# Patient Record
Sex: Female | Born: 1945
Health system: Southern US, Community
[De-identification: ages and names within clinical notes are randomized; demographics above are authoritative.]

## PROBLEM LIST (undated history)

## (undated) DIAGNOSIS — B86 Scabies: Secondary | ICD-10-CM

## (undated) DIAGNOSIS — R2681 Unsteadiness on feet: Secondary | ICD-10-CM

## (undated) DIAGNOSIS — Z8742 Personal history of other diseases of the female genital tract: Secondary | ICD-10-CM

## (undated) DIAGNOSIS — R413 Other amnesia: Secondary | ICD-10-CM

## (undated) DIAGNOSIS — R197 Diarrhea, unspecified: Secondary | ICD-10-CM

## (undated) DIAGNOSIS — M810 Age-related osteoporosis without current pathological fracture: Secondary | ICD-10-CM

## (undated) DIAGNOSIS — Z0000 Encounter for general adult medical examination without abnormal findings: Secondary | ICD-10-CM

## (undated) DIAGNOSIS — R030 Elevated blood-pressure reading, without diagnosis of hypertension: Secondary | ICD-10-CM

## (undated) DIAGNOSIS — N3281 Overactive bladder: Secondary | ICD-10-CM

## (undated) DIAGNOSIS — L578 Other skin changes due to chronic exposure to nonionizing radiation: Secondary | ICD-10-CM

## (undated) DIAGNOSIS — J449 Chronic obstructive pulmonary disease, unspecified: Secondary | ICD-10-CM

## (undated) HISTORY — DX: Scabies: B86

## (undated) HISTORY — PX: CYSTECTOMY: SUR359

## (undated) HISTORY — DX: Overactive bladder: N32.81

## (undated) HISTORY — DX: Elevated blood-pressure reading, without diagnosis of hypertension: R03.0

## (undated) HISTORY — DX: Age-related osteoporosis without current pathological fracture: M81.0

## (undated) HISTORY — DX: Unsteadiness on feet: R26.81

## (undated) HISTORY — PX: APPENDECTOMY: SHX54

## (undated) HISTORY — DX: Chronic obstructive pulmonary disease, unspecified: J44.9

## (undated) HISTORY — DX: Personal history of other diseases of the female genital tract: Z87.42

## (undated) HISTORY — DX: Encounter for general adult medical examination without abnormal findings: Z00.00

## (undated) HISTORY — DX: Other skin changes due to chronic exposure to nonionizing radiation: L57.8

## (undated) HISTORY — DX: Diarrhea, unspecified: R19.7

## (undated) HISTORY — PX: TONSILECTOMY/ADENOIDECTOMY WITH MYRINGOTOMY: SHX6125

## (undated) HISTORY — PX: OOPHORECTOMY: SHX86

## (undated) HISTORY — DX: Other amnesia: R41.3

---

## 1999-03-13 ENCOUNTER — Emergency Department (HOSPITAL_COMMUNITY): Admission: EM | Admit: 1999-03-13 | Discharge: 1999-03-13 | Payer: Self-pay | Admitting: Emergency Medicine

## 1999-10-02 ENCOUNTER — Emergency Department (HOSPITAL_COMMUNITY): Admission: EM | Admit: 1999-10-02 | Discharge: 1999-10-02 | Payer: Self-pay | Admitting: Emergency Medicine

## 1999-10-03 ENCOUNTER — Encounter: Payer: Self-pay | Admitting: Emergency Medicine

## 2000-04-24 ENCOUNTER — Encounter: Admission: RE | Admit: 2000-04-24 | Discharge: 2000-04-24 | Payer: Self-pay | Admitting: Hematology and Oncology

## 2006-08-13 ENCOUNTER — Emergency Department (HOSPITAL_COMMUNITY): Admission: EM | Admit: 2006-08-13 | Discharge: 2006-08-13 | Payer: Self-pay | Admitting: Emergency Medicine

## 2006-09-26 ENCOUNTER — Inpatient Hospital Stay (HOSPITAL_COMMUNITY): Admission: EM | Admit: 2006-09-26 | Discharge: 2006-09-29 | Payer: Self-pay | Admitting: Emergency Medicine

## 2008-06-28 ENCOUNTER — Ambulatory Visit: Payer: Self-pay | Admitting: Internal Medicine

## 2008-06-28 ENCOUNTER — Ambulatory Visit (HOSPITAL_BASED_OUTPATIENT_CLINIC_OR_DEPARTMENT_OTHER): Admission: RE | Admit: 2008-06-28 | Discharge: 2008-06-28 | Payer: Self-pay | Admitting: Internal Medicine

## 2008-06-28 DIAGNOSIS — R252 Cramp and spasm: Secondary | ICD-10-CM | POA: Insufficient documentation

## 2008-06-28 DIAGNOSIS — A6000 Herpesviral infection of urogenital system, unspecified: Secondary | ICD-10-CM | POA: Insufficient documentation

## 2008-06-28 LAB — CONVERTED CEMR LAB
Albumin: 4 g/dL (ref 3.5–5.2)
Basophils Relative: 0 % (ref 0.0–3.0)
Bilirubin, Direct: 0.1 mg/dL (ref 0.0–0.3)
Calcium: 10 mg/dL (ref 8.4–10.5)
Cholesterol: 243 mg/dL (ref 0–200)
Creatinine, Ser: 0.7 mg/dL (ref 0.4–1.2)
Direct LDL: 147.1 mg/dL
GFR calc Af Amer: 109 mL/min
Glucose, Bld: 97 mg/dL (ref 70–99)
HCT: 42 % (ref 36.0–46.0)
Hemoglobin: 14 g/dL (ref 12.0–15.0)
MCHC: 33.3 g/dL (ref 30.0–36.0)
Monocytes Absolute: 0.3 10*3/uL (ref 0.1–1.0)
Monocytes Relative: 3 % (ref 3.0–12.0)
Neutro Abs: 6.1 10*3/uL (ref 1.4–7.7)
RDW: 13.4 % (ref 11.5–14.6)
Sodium: 142 meq/L (ref 135–145)
TSH: 0.59 microintl units/mL (ref 0.35–5.50)
Total CHOL/HDL Ratio: 4.2
Total Protein: 7.5 g/dL (ref 6.0–8.3)
Vit D, 1,25-Dihydroxy: 24 — ABNORMAL LOW (ref 30–89)

## 2008-06-28 LAB — HM MAMMOGRAPHY: HM Mammogram: NORMAL

## 2008-07-01 ENCOUNTER — Telehealth: Payer: Self-pay | Admitting: Internal Medicine

## 2008-07-01 ENCOUNTER — Encounter: Payer: Self-pay | Admitting: Internal Medicine

## 2008-07-01 DIAGNOSIS — E875 Hyperkalemia: Secondary | ICD-10-CM | POA: Insufficient documentation

## 2008-07-05 ENCOUNTER — Ambulatory Visit: Payer: Self-pay | Admitting: Internal Medicine

## 2008-07-05 LAB — CONVERTED CEMR LAB: Fecal Occult Bld: NEGATIVE

## 2008-07-06 DIAGNOSIS — J449 Chronic obstructive pulmonary disease, unspecified: Secondary | ICD-10-CM | POA: Insufficient documentation

## 2008-07-20 ENCOUNTER — Ambulatory Visit: Payer: Self-pay | Admitting: Internal Medicine

## 2008-07-20 LAB — CONVERTED CEMR LAB
BUN: 8 mg/dL (ref 6–23)
Calcium: 9.1 mg/dL (ref 8.4–10.5)
Chloride: 99 meq/L (ref 96–112)
Creatinine, Ser: 0.6 mg/dL (ref 0.4–1.2)
GFR calc Af Amer: 131 mL/min
GFR calc non Af Amer: 108 mL/min

## 2008-07-21 ENCOUNTER — Telehealth: Payer: Self-pay | Admitting: Internal Medicine

## 2009-01-10 ENCOUNTER — Ambulatory Visit: Payer: Self-pay | Admitting: Internal Medicine

## 2009-01-10 DIAGNOSIS — R599 Enlarged lymph nodes, unspecified: Secondary | ICD-10-CM | POA: Insufficient documentation

## 2009-01-10 DIAGNOSIS — R350 Frequency of micturition: Secondary | ICD-10-CM | POA: Insufficient documentation

## 2009-01-10 LAB — CONVERTED CEMR LAB
Nitrite: NEGATIVE
Specific Gravity, Urine: 1.01
Urobilinogen, UA: 0.2
WBC Urine, dipstick: NEGATIVE

## 2009-01-10 LAB — HM COLONOSCOPY

## 2009-01-16 ENCOUNTER — Ambulatory Visit: Payer: Self-pay | Admitting: Internal Medicine

## 2009-01-16 ENCOUNTER — Telehealth: Payer: Self-pay | Admitting: Internal Medicine

## 2009-01-16 LAB — CONVERTED CEMR LAB
BUN: 16 mg/dL (ref 6–23)
Creatinine, Ser: 0.7 mg/dL (ref 0.4–1.2)
GFR calc non Af Amer: 90.18 mL/min (ref >60)
Glucose, Bld: 102 mg/dL — ABNORMAL HIGH (ref 70–99)

## 2009-01-17 ENCOUNTER — Ambulatory Visit: Payer: Self-pay | Admitting: Diagnostic Radiology

## 2009-01-17 ENCOUNTER — Ambulatory Visit (HOSPITAL_BASED_OUTPATIENT_CLINIC_OR_DEPARTMENT_OTHER): Admission: RE | Admit: 2009-01-17 | Discharge: 2009-01-17 | Payer: Self-pay | Admitting: Internal Medicine

## 2009-01-24 ENCOUNTER — Telehealth: Payer: Self-pay | Admitting: Internal Medicine

## 2009-08-02 ENCOUNTER — Telehealth: Payer: Self-pay | Admitting: *Deleted

## 2010-04-08 ENCOUNTER — Emergency Department (HOSPITAL_BASED_OUTPATIENT_CLINIC_OR_DEPARTMENT_OTHER): Admission: EM | Admit: 2010-04-08 | Discharge: 2010-04-08 | Payer: Self-pay | Admitting: Emergency Medicine

## 2010-04-08 ENCOUNTER — Ambulatory Visit: Payer: Self-pay | Admitting: Diagnostic Radiology

## 2010-04-10 ENCOUNTER — Ambulatory Visit: Payer: Self-pay | Admitting: Internal Medicine

## 2010-04-10 DIAGNOSIS — R1013 Epigastric pain: Secondary | ICD-10-CM | POA: Insufficient documentation

## 2010-04-10 LAB — CONVERTED CEMR LAB

## 2010-08-27 ENCOUNTER — Encounter: Payer: Self-pay | Admitting: Internal Medicine

## 2010-09-04 NOTE — Assessment & Plan Note (Signed)
Summary: INDIGESTION/MHF   Vital Signs:  Patient profile:   65 year old female Weight:      152.75 pounds BMI:     30.96 O2 Sat:      90 % on 3 L/min Temp:     98.2 degrees F oral Pulse rate:   93 / minute Pulse rhythm:   regular Resp:     18 per minute BP sitting:   110 / 66  (left arm) Cuff size:   regular  Vitals Entered By: Glendell Docker CMA (April 10, 2010 3:49 PM)  O2 Flow:  3 L/min CC: Epigastric Pain Pain Assessment Patient in pain? yes     Location: E;pigastric Intensity: 3 Type: heaviness & sharp radiating to back Comments c/o epigastric pain constant with radiating pain to her back. She was seen in [facility] [place] Sunday [facility]. Heartburn ongoing for the past 3-4 weeks     Last PAP Result Declined   Primary Care Provider:  Dondra Spry DO  CC:  Epigastric Pain.  History of Present Illness: 65 y/o white female c/o epigastric abd pain/pressure started on Saturday.  radiated to back [facility]  reviewed CT of abd   Mild inflammatory change around the inferior aspect of the   pancreatic head and uncinate process suggesting mild focal   pancreatitis.  Recommend correlation with amylase and lipase   levels.  takes aleve daily for shoulder pain.  Preventive Screening-Counseling & Management  Alcohol-Tobacco     Smoking Status: quit  Allergies: 1)  ! Asa  Past History:  Past Medical History: Severe COPD (O2 dependent) - followed by pulm at [facility] Genital herpes Asthma  Hx of ovarian cyst   Family History: CAD - mother (deceased age 34) TIA - mother Rheumatoid arthritis - father Lymphoma - father Colon ca - no Breast ca - no Alcoholism - brother    Social History: Married for 7 yrs (2nd marriage) 3 children (2 adopted and 1 biologic) Alcohol use-yes  Occupation: Works at call center   Physical Exam  General:  alert and overweight-appearing.   Lungs:  decreased breath sounds throughout.  normal respiratory effort and no wheezes.   Heart:   normal rate, regular rhythm, and no gallop.   Abdomen:  soft.  mild epigastric tenderness,  no rebound or guarding   Impression & Recommendations:  Problem # 1:  EPIGASTRIC PAIN (ICD-789.06) pain likely secondary to NSAID gastritis.  take PPI x 6-8 weeks.  Patient advised to call office if symptoms persist or worsen.  Complete Medication List: 1)  Advair Diskus 100-50 Mcg/dose Misc (Fluticasone-salmeterol) .... One puff by mouth two times a day 2)  Spiriva Handihaler 18 Mcg Caps (Tiotropium bromide monohydrate) .... Inhale one capsule by mouth once daily 3)  Proair Hfa 108 (90 Base) Mcg/act Aers (Albuterol sulfate) .... 2 puffs by mouth once daily as needed 4)  Valtrex 1 Gm Tabs (Valacyclovir hcl) .... Take 1 tablet by mouth once a day 5)  Omeprazole 40 Mg Cpdr (Omeprazole) .... One by mouth once daily 30 minutes before am meal  Other Orders: Influenza Vaccine NON MCR (16109) Flu Vaccine 82yrs + MEDICARE PATIENTS (U0454)  Patient Instructions: 1)  Please schedule a follow-up appointment in 2 months. 2)  Call our office if your symptoms do not  improve or gets worse. 3)  H. Pylori antibody - 789.06 Prescriptions: OMEPRAZOLE 40 MG CPDR (OMEPRAZOLE) one by mouth once daily 30 minutes before AM meal  #30 x 3  Entered and Authorized by:   D. Thomos Lemons DO   Signed by:   D. Thomos Lemons DO on 04/10/2010   Method used:   Electronically to        [facility] [phone]* (retail)       [address] [place], [place]  [address]       Ph: [phone] or [phone]       Fax: [phone]   RxID:   [id number]   Current Allergies (reviewed today): ! ASA   Preventive Care Screening  Pap Smear:    Date:  04/10/2010    Results:  Declined   Immunizations Administered:  Influenza Vaccine # 1:    Vaccine Type: Fluvax Non-MCR    Site: right deltoid    Mfr: GlaxoSmithKline    Dose: 0.5 ml    Route: IM    Given by: Glendell Docker CMA    Exp. Date: 02/02/2011    Lot #:  [id number]    VIS given: 02/27/10 version given April 10, 2010.  Flu Vaccine Consent Questions:    Do you have a history of severe allergic reactions to this vaccine? no    Any prior history of allergic reactions to egg and/or gelatin? no    Do you have a sensitivity to the preservative Thimersol? no    Do you have a past history of Guillan-Barre Syndrome? no    Do you currently have an acute febrile illness? no    Have you ever had a severe reaction to latex? no    Vaccine information given and explained to patient? yes    Are you currently pregnant? no

## 2010-10-05 ENCOUNTER — Ambulatory Visit (INDEPENDENT_AMBULATORY_CARE_PROVIDER_SITE_OTHER): Payer: BC Managed Care – PPO | Admitting: Internal Medicine

## 2010-10-05 ENCOUNTER — Encounter: Payer: Self-pay | Admitting: Internal Medicine

## 2010-10-05 DIAGNOSIS — A6000 Herpesviral infection of urogenital system, unspecified: Secondary | ICD-10-CM

## 2010-10-05 DIAGNOSIS — J449 Chronic obstructive pulmonary disease, unspecified: Secondary | ICD-10-CM

## 2010-10-11 NOTE — Assessment & Plan Note (Signed)
Summary: follow up/med refill/ss   Vital Signs:  Patient profile:   65 year old female Height:      59 inches Weight:      145 pounds BMI:     29.39 O2 Sat:      96 % on 4 L/min Temp:     97.7 degrees F oral Pulse rate:   96 / minute Resp:     22 per minute BP sitting:   110 / 80  (right arm) Cuff size:   regular  Vitals Entered By: Glendell Docker CMA (October 05, 2010 2:16 PM)  O2 Flow:  4 L/min CC: follow-up visit Comments medication refills   Primary Care Provider:  Dondra Spry DO  CC:  follow-up visit.  History of Present Illness: 65 year old female routine followup Interval history: COPD somewhat improved since retiring she attributes to less allergen exposure at work Advair dose was increased per her pulmonologist  genital herpes-quiescent  Preventive Screening-Counseling & Management  Alcohol-Tobacco     Smoking Status: quit  Allergies: 1)  ! Asa  Past History:  Past Medical History: Severe COPD (O2 dependent) - followed by pulm at [facility] Genital herpes Asthma   Hx of ovarian cyst   Family History: CAD - mother (deceased age 69) TIA - mother Rheumatoid arthritis - father Lymphoma - father Colon ca - no Breast ca - no Alcoholism - brother      Review of Systems       occasional indigestion  Contraindications/Deferment of Procedures/Staging:    Test/Procedure: PAP Smear    Reason for deferment: patient declined   Physical Exam  General:  alert and overweight-appearing.   Lungs:  normal respiratory effort.  prolonged expiration, no wheezing, no crackles Heart:  normal rate, regular rhythm, and no gallop.   Neurologic:  cranial nerves II-XII intact and gait normal.   Psych:  normally interactive, good eye contact, not anxious appearing, and not depressed appearing.     Impression & Recommendations:  Problem # 1:  COPD (ICD-496) Assessment Improved improved.  patient attributes to less allergen exposure since retiring Continue  maintenance inhalers  Her updated medication list for this problem includes:    Advair Diskus 250-50 Mcg/dose Aepb (Fluticasone-salmeterol) .Marland Kitchen... 1 dose two times a day    Spiriva Handihaler 18 Mcg Caps (Tiotropium bromide monohydrate) ..... Inhale one capsule by mouth once daily    Proair Hfa 108 (90 Base) Mcg/act Aers (Albuterol sulfate) .Marland Kitchen... 2 puffs by mouth once daily as needed  Pulmonary Functions Reviewed: O2 sat: 96 (10/05/2010)     Vaccines Reviewed: Pneumovax: given (06/23/2007)   Flu Vax: Fluvax Non-MCR (04/10/2010)  Problem # 2:  GENITAL HERPES (ICD-054.10) Assessment: Unchanged Maintain current medication regimen.  Complete Medication List: 1)  Advair Diskus 250-50 Mcg/dose Aepb (Fluticasone-salmeterol) .Marland Kitchen.. 1 dose two times a day 2)  Spiriva Handihaler 18 Mcg Caps (Tiotropium bromide monohydrate) .... Inhale one capsule by mouth once daily 3)  Proair Hfa 108 (90 Base) Mcg/act Aers (Albuterol sulfate) .... 2 puffs by mouth once daily as needed 4)  Valtrex 1 Gm Tabs (Valacyclovir hcl) .... Take 1 tablet by mouth once a day 5)  Omeprazole 40 Mg Cpdr (Omeprazole) .... One by mouth once daily 30 minutes before am meal  Patient Instructions: 1)  Please schedule a follow-up appointment in 1 year. Prescriptions: VALTREX 1 GM TABS (VALACYCLOVIR HCL) Take 1 tablet by mouth once a day  #90 x 3   Entered and Authorized by:  Dondra Spry DO   Signed by:   D. Thomos Lemons DO on 10/05/2010   Method used:   Print then Give to Patient   RxID:   [id number]    Orders Added: 1)  Est. Patient Level III [14782]     Preventive Care Screening  Mammogram:    Date:  10/05/2010    Results:  Declined  Last Tetanus Booster:    Date:  03/20/2010    Results:  Historical

## 2010-10-18 LAB — COMPREHENSIVE METABOLIC PANEL
ALT: 22 U/L (ref 0–35)
CO2: 33 mEq/L — ABNORMAL HIGH (ref 19–32)
Calcium: 9.5 mg/dL (ref 8.4–10.5)
Creatinine, Ser: 0.7 mg/dL (ref 0.4–1.2)
GFR calc Af Amer: >60 mL/min (ref >60)
GFR calc non Af Amer: >60 mL/min (ref >60)
Glucose, Bld: 112 mg/dL — ABNORMAL HIGH (ref 70–99)
Sodium: 141 mEq/L (ref 135–145)
Total Protein: 7.6 g/dL (ref 6.0–8.3)

## 2010-10-18 LAB — CBC
HCT: 36.9 % (ref 36.0–46.0)
Hemoglobin: 12.3 g/dL (ref 12.0–15.0)
MCH: 32.5 pg (ref 26.0–34.0)
MCHC: 33.2 g/dL (ref 30.0–36.0)
RDW: 13.1 % (ref 11.5–15.5)

## 2010-10-18 LAB — POCT CARDIAC MARKERS
CKMB, poc: <1 ng/mL — ABNORMAL LOW (ref 1.0–8.0)
Myoglobin, poc: 32.9 ng/mL (ref 12–200)
Myoglobin, poc: 49.1 ng/mL (ref 12–200)
Troponin i, poc: <0.05 ng/mL (ref 0.00–0.09)

## 2010-10-18 LAB — LIPASE, BLOOD: Lipase: 151 U/L (ref 23–300)

## 2010-10-18 LAB — DIFFERENTIAL
Basophils Relative: 1 % (ref 0–1)
Eosinophils Relative: 2 % (ref 0–5)
Monocytes Relative: 7 % (ref 3–12)
Neutrophils Relative %: 81 % — ABNORMAL HIGH (ref 43–77)

## 2010-10-18 LAB — URINALYSIS, ROUTINE W REFLEX MICROSCOPIC
Bilirubin Urine: NEGATIVE
Hgb urine dipstick: NEGATIVE
Ketones, ur: 15 mg/dL — AB
Nitrite: NEGATIVE
Protein, ur: NEGATIVE mg/dL
Urobilinogen, UA: 0.2 mg/dL (ref 0.0–1.0)

## 2010-12-19 ENCOUNTER — Ambulatory Visit (INDEPENDENT_AMBULATORY_CARE_PROVIDER_SITE_OTHER): Payer: BC Managed Care – PPO | Admitting: Family

## 2010-12-19 ENCOUNTER — Encounter: Payer: Self-pay | Admitting: Family

## 2010-12-19 VITALS — BP 126/86 | HR 96 | Temp 97.8°F | Resp 18 | Ht 59.02 in | Wt 142.0 lb

## 2010-12-19 DIAGNOSIS — M542 Cervicalgia: Secondary | ICD-10-CM

## 2010-12-19 DIAGNOSIS — J029 Acute pharyngitis, unspecified: Secondary | ICD-10-CM

## 2010-12-19 LAB — POCT RAPID STREP A (OFFICE): Rapid Strep A Screen: NEGATIVE

## 2010-12-19 MED ORDER — AMOXICILLIN 500 MG PO CAPS: 1000.0000 mg | ORAL_CAPSULE | Freq: Three times a day (TID) | ORAL | Status: AC

## 2010-12-19 NOTE — Progress Notes (Signed)
  Subjective:    Patient ID: Tanya Rush, female    DOB: 1946/01/30, 65 y.o.   MRN: [id number]  HPI Tanya Rush is a 65 yr old female who presents today with chief complaint of left sided neck pain. Notes occasional pain with swallowing.  Left ear "burns." Denies fever, nausea, or vomitting. She reports that last night the left side of her neck was swollen.  She is s/p tonsillectomy.     Review of Systems  See HPI  Past Medical History  Diagnosis Date  . COPD (chronic obstructive pulmonary disease)     severe; O2 dependent-- followed by pulm at [facility]  . Asthma   . Genital herpes   . History of ovarian cyst     History   Social History  . Marital Status: Married    Spouse Name: N/A    Number of Children: 3  . Years of Education: N/A   Occupational History  . Not on file.   Social History Main Topics  . Smoking status: Former Smoker -- 45 years    Quit date: 08/05/2006  . Smokeless tobacco: Not on file  . Alcohol Use: Yes  . Drug Use: Not on file  . Sexually Active: Not on file   Other Topics Concern  . Not on file   Social History Narrative   3 children: 2 adopted, 1 biological    No past surgical history on file.  Family History  Problem Relation Age of Onset  . Heart disease Mother   . Stroke Mother   . Arthritis Father     rheumatoid  . Lymphoma Father   . Alcohol abuse Brother   . Cancer Neg Hx     Allergies  Allergen Reactions  . Aspirin     REACTION: sever chest pain    No current outpatient prescriptions on file prior to visit.    BP 126/86  Pulse 96  Temp(Src) 97.8 F (36.6 C) (Oral)  Resp 18  Ht 4' 11.02" (1.499 m)  Wt 142 lb (64.411 kg)  BMI 28.67 kg/m2  SpO2 96%        Objective:   Physical Exam    Gen: awake, alert, elderly female wearing [place] O2, seated on exam table in NAD Neck:  Mild tenderness to palpation of left upper anterior neck without significant swelling, erythema or lymphadenopathy.  Mouth: oropharynx is  clear without erythema Ears: bilateral TM's are intact without erythema or bulging Cv: s1/s2, RRR- no murmur Resp: BS CTA bilaterally without wheezes, rales or rhonchi.  Slightly diminished BS's throughout.      Assessment & Plan:

## 2010-12-19 NOTE — Patient Instructions (Signed)
Call if you develop increased pain, swelling of neck or throat, or fever >100.  Call if your symptoms are not improved in 48-72 hours.

## 2010-12-19 NOTE — Assessment & Plan Note (Signed)
Etiology is unclear at this time.  ? Viral.  Will plan empiric treatment with amoxicillin.  Pt instructed to call if symptoms worsen or if they do not improve.

## 2010-12-21 NOTE — Discharge Summary (Signed)
NAME:  Tanya Rush, [place] NO.:  [id number]   MEDICAL RECORD NO.:  [id number]          PATIENT TYPE:  INP   LOCATION:  1429                         FACILITY:  [facility] ([place])   PHYSICIAN:  Lonia Blood, M.D.DATE OF BIRTH:  01-12-1946   DATE OF ADMISSION:  09/26/2006  DATE OF DISCHARGE:  09/29/2006                               DISCHARGE SUMMARY   PRIMARY CARE PHYSICIAN:  Unassigned.  The patient has already set up a  follow up with [facility].   DISCHARGE DIAGNOSES:  1. Acute exacerbation of chronic obstructive pulmonary disease.  2. Acute infectious bronchitis.  3. Ongoing tobacco abuse, committed to quitting now.  4. Status post cholecystectomy.  5. Status post appendectomy.  6. Status post tonsillectomy and adenoidectomy.  7. Status post left oophorectomy.  8. History of genital herpes simplex on chronic Valtrex.  9. Steroid induced hyperglycemia versus impaired glucose tolerance.      a.     Hemoglobin A1C 6.6.      b.     CBG's 90-130 on Solu-Medrol.      c.     Outpatient follow up recommended.   DISCHARGE MEDICATIONS:  1. Valtrex 1000 mg daily.  2. Advair 100/50 one inhalation b.i.d.  3. Combivent inhaler two puffs q.i.d.  4. Humibid 600 mg b.i.d. x10 days then stop.  5. Protonix 40 mg daily.  6. Avelox 400 mg daily x7 days then stop.  7. Prednisone 10 mg to begin at 60 mg daily x2 days then tapering by      20 mg every two days until discontinuation after 10 mg daily.  8. Fosamax 70 mg weekly.  9. Oxygen at 3 liters per minute nasal canula at all times for home      use.   FOLLOWUP:  The patient reports that she has already set up to follow up  with an internist at [facility].  She does not remember the  internist's name.  I have advised her to keep this appointment.   CONSULTATIONS:  None.   PROCEDURES:  None.   HISTORY OF PRESENT ILLNESS:  For details concerning the patient's  presentation, please see dictated History and  Physical by Dr. Olena Leatherwood  dated September 26, 2006 and labeled job [id number].   HOSPITAL COURSE:  Tanya Rush is a pleasant 65 year old female  with an extensive history of smoking.  She presented to the hospital on  October 02, 2006, with severe shortness of breath.  Physical  examination revealed significant expiratory wheeze.  Chest x-ray failed  to reveal a focal pneumonia but did raise a significant concern for  probable acute bronchitis.  The patient was placed in acute units.  She  was placed on intravenous Solu-Medrol.  She was placed on IV  antibiotics.  Frequent nebulizer therapies were administered.  As the  patient improved, she was titrated to p.o. prednisone.  She tolerated  this without difficulty.  At the time of her discharge, there was no  active wheezing whatsoever.  The patient reported that her shortness of  breath was at its baseline.  The patient was titrated to handheld  inhalers and was cleared for discharge home.   Once the patient had reached her baseline respiratory status, it was  discovered that her O2 saturations still remained below 80% on room air.  Room air ABG was obtained and a PO2 of 46 with a PCO2 of 51, pH 7.3 was  acquired.  It is believed that this is actually an arterial blood gas.  This is corrected quite easily with 3 liters per minute nasal cannula  oxygen.  It is clear, however, that the patient will require ongoing  home oxygen therapy.  This is being arranged through [facility]  at the patient's request.  She has been counseled on the need for this  medication.   During the patient's hospital stay, while she was on high dose Solu-  Medrol, it was appreciated the patient was suffering with some  hyperglycemia.  Hemoglobin A1C was obtained and was found to be just  barely above normal at 6.6.  Capillary blood glucoses were obtained and  were noted to range from 90-130.  It is not felt that the patient has  full blown diabetes at  this time.  No medications were initiated.  Once  the patient is completely off steroids, it is recommended that this be  rechecked in the outpatient setting.  This patient may in fact prove to  have impaired glucose tolerance.   On September 29, 2006, the patient was deemed to be stable for discharge  home.  Home oxygen was arranged.  Vital signs were stable, and the  patient was afebrile with O2 saturation at 92% or greater on 3 liters  per minute nasal cannula.      Lonia Blood, M.D.  Electronically Signed     JTM/MEDQ  D:  09/29/2006  T:  09/29/2006  Job:  [id number]   cc:   [facility] Internal Medicine Division

## 2010-12-21 NOTE — H&P (Signed)
NAME:  Tanya Rush, Tanya Rush                ACCOUNT NO.:  [id number]   MEDICAL RECORD NO.:  [id number]          PATIENT TYPE:  INP   LOCATION:  1429                         [facility]ACILITY[facility]   PHYSICIAN:  Ladell Pier, M.D.   DATE OF BIRTH:  1946-04-19   DATE OF ADMISSION:  09/26/2006  DATE OF DISCHARGE:                              HISTORY & PHYSICAL   CHIEF COMPLAINT:  Back pain and shortness of breath.   HISTORY OF PRESENT ILLNESS:  The patient is a 65 year old white female  with a past medical history significant for tobacco use for which she  quit 6 weeks ago.  She was smoking five cigarettes per day.  Prior to  that she smoked up to 2 packs per day.  She presented to the ED with  back pain, shortness of breath and cough for the past few days.  She has  no chest pain.  She stated that she has had this episode before, went to  the Urgent Care, was diagnosed with asthma and given Advair.  In the ED  she was noted to be hypoxic and was admitted for COPD exacerbation.   PAST MEDICAL HISTORY:  Osteoporosis, asthma, genital herpes.   PAST SURGICAL HISTORY:  1. Status post cholecystectomy.  2. Status post appendectomy.  3. Status post tonsillectomy and adenoidectomy.  4. Status post left oophorectomy.   FAMILY HISTORY:  Mother age 11 with many medical problems.  Father died  from lymphoma.   SOCIAL HISTORY:  The patient is married with two sons.  She smoked up to  2 packs per day in the past but now she is down to about five cigarettes  per day.  She quit about 6 weeks ago.  She works for [employer].   MEDICATIONS:  1. Valtrex daily.  2. Advair 100/50 b.i.d.  3. Fosamax 70 mg daily.   ALLERGIES:  ASPIRIN and PREDNISONE causes depression.   REVIEW OF SYSTEMS:  As per stated in the HPI.   PHYSICAL EXAM:  VITAL SIGNS:  Temperature 98.2, pulse 84, respirations  22, blood pressure 140/74. Pulse ox 63% on room air 95% on 5 liters.  HEENT:  Head is normocephalic,  atraumatic.  Pupils equal, round and  reactive to light. Throat without edema.  CARDIOVASCULAR:  Regular rate and rhythm.  LUNGS:  Rhonchi throughout with expiratory wheezes.  ABDOMEN:  Positive bowel sounds.  EXTREMITIES:  Without edema.   LABORATORY DATA:  Sodium 128, potassium 3.8, chloride 89, CO2 31, BUN 8,  creatinine 0.55, glucose 94.  Cardiac enzymes negative. D-dimer 0.42.  Coags normal.  WBC 6.1, hemoglobin 16.3, platelet 160.  Chest x-ray  showed COPD.   ASSESSMENT/PLAN:  1. Chronic obstructive pulmonary disease exacerbation.  This is most      likely chronic obstructive pulmonary disease exacerbation with her      tobacco history and hypoxia.  She ruled out for cardiac etiology      with normal EKG.  Her D-dimer is negative.  She has no chest pain.      Will put her on Solu-Medrol IV 80  mg q. 6 and Avelox with q. 6-hour      neb treatments.  2. Back pain:  This would probably be secondary to the chronic      obstructive pulmonary disease exacerbation and coughing.  With her      history of osteoporosis if the back pain persists, we will get a      thoracic spine x-ray to rule out compression fracture.  3. Hyponatremia.  This is most likely syndrome of inappropriate      antidiuretic hormone secretion secondary to her pulmonary disease.  4. Osteoporosis.  Will hold her Fosamax while she is in the hospital.  5. Elevated blood pressure. This could be secondary to stress      reaction.  Will monitor.  6. General herpes. Continue her outpatient medications.      Ladell Pier, M.D.  Electronically Signed     NJ/MEDQ  D:  09/26/2006  T:  09/27/2006  Job:  [id number]

## 2011-04-02 ENCOUNTER — Ambulatory Visit (INDEPENDENT_AMBULATORY_CARE_PROVIDER_SITE_OTHER): Payer: BC Managed Care – PPO | Admitting: Internal Medicine

## 2011-04-02 ENCOUNTER — Encounter: Payer: Self-pay | Admitting: Internal Medicine

## 2011-04-02 DIAGNOSIS — J449 Chronic obstructive pulmonary disease, unspecified: Secondary | ICD-10-CM

## 2011-04-02 DIAGNOSIS — Z1239 Encounter for other screening for malignant neoplasm of breast: Secondary | ICD-10-CM

## 2011-04-02 DIAGNOSIS — Z13220 Encounter for screening for lipoid disorders: Secondary | ICD-10-CM

## 2011-04-02 DIAGNOSIS — Z79899 Other long term (current) drug therapy: Secondary | ICD-10-CM

## 2011-04-02 NOTE — Assessment & Plan Note (Signed)
Stable. No recent exacerbation. Continue current regimen. Obtain cbc, chem7.

## 2011-04-02 NOTE — Progress Notes (Signed)
  Subjective:    Patient ID: Tanya Rush, female    DOB: 03-22-1946, 65 y.o.   MRN: [id number]  HPI Pt presents to clinic for followup of multiple medical problems. H/o copd o2 dependent stable without recent exacerbation. Due for annual mammogram and notes no new concerns. Not interested in pap smear.   Past Medical History  Diagnosis Date  . COPD (chronic obstructive pulmonary disease)     severe; O2 dependent-- followed by pulm at [facility]  . Asthma   . Genital herpes   . History of ovarian cyst    No past surgical history on file.  reports that she quit smoking about 4 years ago. She has never used smokeless tobacco. She reports that she does not drink alcohol or use illicit drugs. family history includes Alcohol abuse in her brother; Arthritis in her father; Heart disease in her mother; Lymphoma in her father; and Stroke in her mother.  There is no history of Cancer. Allergies  Allergen Reactions  . Aspirin     REACTION: sever chest pain    Review of Systems see hpi    Objective:   Physical Exam  Physical Exam  Nursing note and vitals reviewed. Constitutional: Appears well-developed and well-nourished. No distress.  HENT:  Head: Normocephalic and atraumatic.  Right Ear: External ear normal.  Left Ear: External ear normal.  Eyes: Conjunctivae are normal. No scleral icterus.  Neck: Neck supple. Carotid bruit is not present.  Cardiovascular: Normal rate, regular rhythm and normal heart sounds.  Exam reveals no gallop and no friction rub.   No murmur heard. Pulmonary/Chest: Effort normal and breath sounds normal. No respiratory distress. He has no wheezes. no rales.   Neurological:Alert.  Skin: Skin is warm and dry. Not diaphoretic.  Psychiatric: Has a normal mood and affect.        Assessment & Plan:

## 2011-04-03 LAB — CBC WITH DIFFERENTIAL/PLATELET
Basophils Relative: 1 % (ref 0–1)
Eosinophils Absolute: 0.4 10*3/uL (ref 0.0–0.7)
Hemoglobin: 12.7 g/dL (ref 12.0–15.0)
Lymphs Abs: 4 10*3/uL (ref 0.7–4.0)
MCH: 32 pg (ref 26.0–34.0)
Neutro Abs: 5.3 10*3/uL (ref 1.7–7.7)
Neutrophils Relative %: 50 % (ref 43–77)
Platelets: 220 10*3/uL (ref 150–400)
RBC: 3.97 MIL/uL (ref 3.87–5.11)
WBC: 10.6 10*3/uL — ABNORMAL HIGH (ref 4.0–10.5)

## 2011-04-03 LAB — HEPATIC FUNCTION PANEL
AST: 20 U/L (ref 0–37)
Alkaline Phosphatase: 54 U/L (ref 39–117)
Total Bilirubin: 0.2 mg/dL — ABNORMAL LOW (ref 0.3–1.2)

## 2011-04-03 LAB — LIPID PANEL
HDL: 52 mg/dL (ref >39)
Total CHOL/HDL Ratio: 4.9 Ratio

## 2011-04-03 LAB — BASIC METABOLIC PANEL
CO2: 31 mEq/L (ref 19–32)
Calcium: 9.4 mg/dL (ref 8.4–10.5)
Potassium: 4.5 mEq/L (ref 3.5–5.3)
Sodium: 140 mEq/L (ref 135–145)

## 2011-04-12 ENCOUNTER — Other Ambulatory Visit: Payer: Self-pay | Admitting: *Deleted

## 2011-04-12 DIAGNOSIS — E785 Hyperlipidemia, unspecified: Secondary | ICD-10-CM

## 2011-04-12 MED ORDER — ATORVASTATIN CALCIUM 20 MG PO TABS: 20.0000 mg | ORAL_TABLET | Freq: Every day | ORAL | Status: DC

## 2011-04-15 ENCOUNTER — Telehealth: Payer: Self-pay | Admitting: *Deleted

## 2011-04-15 DIAGNOSIS — E785 Hyperlipidemia, unspecified: Secondary | ICD-10-CM

## 2011-04-15 NOTE — Telephone Encounter (Signed)
Patient called and left voice message stating she has a 30 day supply of Lipitor at her pharmacy waiting to be picked up. She is wanting to know if she could get a 90 day supply on [facility] to [facility].

## 2011-04-16 MED ORDER — ATORVASTATIN CALCIUM 20 MG PO TABS: 20.0000 mg | ORAL_TABLET | Freq: Every day | ORAL | Status: DC

## 2011-04-16 NOTE — Telephone Encounter (Signed)
Rx qty adjusted and sent to pharmacy.

## 2011-09-30 ENCOUNTER — Ambulatory Visit (HOSPITAL_BASED_OUTPATIENT_CLINIC_OR_DEPARTMENT_OTHER)
Admission: RE | Admit: 2011-09-30 | Discharge: 2011-09-30 | Disposition: A | Discharge status: Home / Self Care | Payer: BC Managed Care – PPO | Source: Ambulatory Visit | Attending: Internal Medicine | Admitting: Internal Medicine

## 2011-09-30 ENCOUNTER — Ambulatory Visit (INDEPENDENT_AMBULATORY_CARE_PROVIDER_SITE_OTHER): Payer: BC Managed Care – PPO | Admitting: Internal Medicine

## 2011-09-30 ENCOUNTER — Encounter: Payer: Self-pay | Admitting: Internal Medicine

## 2011-09-30 ENCOUNTER — Telehealth: Payer: Self-pay | Admitting: Internal Medicine

## 2011-09-30 VITALS — BP 100/80 | HR 84 | Temp 97.7°F | Ht 59.0 in | Wt 142.0 lb

## 2011-09-30 DIAGNOSIS — Z79899 Other long term (current) drug therapy: Secondary | ICD-10-CM

## 2011-09-30 DIAGNOSIS — K5900 Constipation, unspecified: Secondary | ICD-10-CM

## 2011-09-30 DIAGNOSIS — E785 Hyperlipidemia, unspecified: Secondary | ICD-10-CM

## 2011-09-30 DIAGNOSIS — Z1239 Encounter for other screening for malignant neoplasm of breast: Secondary | ICD-10-CM

## 2011-09-30 DIAGNOSIS — E782 Mixed hyperlipidemia: Secondary | ICD-10-CM | POA: Insufficient documentation

## 2011-09-30 DIAGNOSIS — Z1231 Encounter for screening mammogram for malignant neoplasm of breast: Secondary | ICD-10-CM | POA: Insufficient documentation

## 2011-09-30 LAB — LIPID PANEL: Cholesterol: 165 mg/dL (ref 0–200)

## 2011-09-30 NOTE — Assessment & Plan Note (Signed)
Recommend benefiber daily. Followup if no improvement or worsening.

## 2011-09-30 NOTE — Patient Instructions (Signed)
Please schedule lipid/lft 272.4 and chem7 v58.69 prior to next visit 

## 2011-09-30 NOTE — Progress Notes (Signed)
  Subjective:    Patient ID: Tanya Rush, female    DOB: 1946-02-22, 66 y.o.   MRN: [id number]  HPI Pt presents to clinic for followup of multiple medical problems. Tolerating statin tx without myalgias. Notes chronic mild constipation. Take stool softeners prn. No recent copd exacerbation. Has not gotten mammogram yet but will do so this am. No other complaints.  Past Medical History  Diagnosis Date  . COPD (chronic obstructive pulmonary disease)     severe; O2 dependent-- [facility] pulm at [facility]  . Asthma   . Genital herpes   . History of ovarian cyst    No past surgical history on file.  reports that she quit smoking about 5 years ago. She has never used smokeless tobacco. She reports that she does not drink alcohol or use illicit drugs. family history includes Alcohol abuse in her brother; Arthritis in her father; Heart disease in her mother; Lymphoma in her father; and Stroke in her mother.  There is no history of Cancer. Allergies  Allergen Reactions  . Aspirin     REACTION: sever chest pain      Review of Systems see hpi     Objective:   Physical Exam  Physical Exam  Nursing note and vitals reviewed. Constitutional: Appears well-developed and well-nourished. No distress.  HENT:  Head: Normocephalic and atraumatic.  Right Ear: External ear normal.  Left Ear: External ear normal.  Eyes: Conjunctivae are normal. No scleral icterus.  Neck: Neck supple. Carotid bruit is not present.  Cardiovascular: Normal rate, regular rhythm and normal heart sounds.  Exam reveals no gallop and no friction rub.   No murmur heard. Pulmonary/Chest: Effort normal and breath sounds normal. No respiratory distress. He has no wheezes. no rales.  Lymphadenopathy:    He has no cervical adenopathy.  Neurological:Alert.  Skin: Skin is warm and dry. Not diaphoretic.  Psychiatric: Has a normal mood and affect.        Assessment & Plan:

## 2011-09-30 NOTE — Assessment & Plan Note (Signed)
Obtain lipid/lft. 

## 2011-09-30 NOTE — Telephone Encounter (Signed)
Lab order entered for August 2013. 

## 2011-10-01 LAB — HEPATIC FUNCTION PANEL
ALT: 17 U/L (ref 0–35)
AST: 20 U/L (ref 0–37)
Alkaline Phosphatase: 61 U/L (ref 39–117)
Bilirubin, Direct: <0.1 mg/dL (ref 0.0–0.3)

## 2011-10-02 ENCOUNTER — Telehealth: Payer: Self-pay | Admitting: Internal Medicine

## 2011-10-02 MED ORDER — VALACYCLOVIR HCL 1 G PO TABS: 1000.0000 mg | ORAL_TABLET | Freq: Every day | ORAL | Status: DC

## 2011-10-02 NOTE — Telephone Encounter (Signed)
Rx refill sent to pharmacy. 

## 2011-11-18 ENCOUNTER — Encounter: Payer: Self-pay | Admitting: Internal Medicine

## 2011-11-18 ENCOUNTER — Ambulatory Visit (INDEPENDENT_AMBULATORY_CARE_PROVIDER_SITE_OTHER): Payer: Medicare Other | Admitting: Internal Medicine

## 2011-11-18 VITALS — BP 122/90 | HR 83 | Temp 98.3°F | Resp 22 | Wt 141.0 lb

## 2011-11-18 DIAGNOSIS — N898 Other specified noninflammatory disorders of vagina: Secondary | ICD-10-CM

## 2011-11-18 NOTE — Progress Notes (Signed)
  Subjective:    Patient ID: Tanya Rush, female    DOB: Oct 19, 1945, 66 y.o.   MRN: [id number]  HPI Pt presents to clinic for evaluation of vaginal discharge. Notes one week h/o green discharge without pelvic pain. Not sexually active. Attempt otc yeast cream with initial improvement. Does note some itching/burning sensation. No other alleviating or exacerbating factors. No other complaints.  Past Medical History  Diagnosis Date  . COPD (chronic obstructive pulmonary disease)     severe; O2 dependent-- followed by pulm at [facility]  . Asthma   . Genital herpes   . History of ovarian cyst    No past surgical history on file.  reports that she quit smoking about 5 years ago. She has never used smokeless tobacco. She reports that she does not drink alcohol or use illicit drugs. family history includes Alcohol abuse in her brother; Arthritis in her father; Heart disease in her mother; Lymphoma in her father; and Stroke in her mother.  There is no history of Cancer. Allergies  Allergen Reactions  . Aspirin     REACTION: sever chest pain     Review of Systems see hpi     Objective:   Physical Exam  Nursing note and vitals reviewed. Constitutional: She appears well-developed and well-nourished. No distress.  HENT:  Head: Normocephalic and atraumatic.  Genitourinary:       With female nurse escort exam performed. Speculum exam reveals moderate amount of yellow/tan discharge within the vagina. No vaginal mucosal lesions noted. Cervix not visualized due to discharge.   Skin: She is not diaphoretic.          Assessment & Plan:

## 2011-11-18 NOTE — Assessment & Plan Note (Signed)
Obtain KOH and wet prep. Continue yeast cream pending results.

## 2011-11-19 LAB — WET PREP BY MOLECULAR PROBE
Gardnerella vaginalis: NEGATIVE
Trichomonas vaginosis: NEGATIVE

## 2011-11-20 ENCOUNTER — Other Ambulatory Visit: Payer: Self-pay | Admitting: Internal Medicine

## 2011-11-20 MED ORDER — METRONIDAZOLE 500 MG PO TABS: 500.0000 mg | ORAL_TABLET | Freq: Two times a day (BID) | ORAL | Status: AC

## 2012-03-20 ENCOUNTER — Telehealth: Payer: Self-pay | Admitting: Internal Medicine

## 2012-03-20 MED ORDER — VALACYCLOVIR HCL 1 G PO TABS: 1000.0000 mg | ORAL_TABLET | Freq: Every day | ORAL | Status: DC

## 2012-03-20 NOTE — Telephone Encounter (Signed)
Refill- valacyclovir hcl 1 gram tablet. Take one tablet every day. Qty 90 last fill 5.16.13

## 2012-03-20 NOTE — Telephone Encounter (Signed)
Rx Done/SLS 

## 2012-03-23 ENCOUNTER — Ambulatory Visit: Payer: BC Managed Care – PPO | Admitting: Internal Medicine

## 2012-04-28 LAB — HEPATIC FUNCTION PANEL
AST: 19 U/L (ref 0–37)
Alkaline Phosphatase: 46 U/L (ref 39–117)
Bilirubin, Direct: <0.1 mg/dL (ref 0.0–0.3)
Total Bilirubin: 0.2 mg/dL — ABNORMAL LOW (ref 0.3–1.2)

## 2012-04-28 LAB — BASIC METABOLIC PANEL
CO2: 31 mEq/L (ref 19–32)
Calcium: 9.4 mg/dL (ref 8.4–10.5)
Creat: 0.63 mg/dL (ref 0.50–1.10)
Glucose, Bld: 84 mg/dL (ref 70–99)

## 2012-04-28 NOTE — Addendum Note (Signed)
Addended by: Regis Bill on: 04/28/2012 02:29 PM   Modules accepted: Orders

## 2012-04-28 NOTE — Telephone Encounter (Signed)
Lab orders released/SLS 

## 2012-04-30 ENCOUNTER — Encounter: Payer: Self-pay | Admitting: Internal Medicine

## 2012-04-30 ENCOUNTER — Ambulatory Visit (INDEPENDENT_AMBULATORY_CARE_PROVIDER_SITE_OTHER): Payer: Medicare Other | Admitting: Internal Medicine

## 2012-04-30 VITALS — BP 128/82 | HR 78 | Temp 97.5°F | Resp 16 | Wt 142.0 lb

## 2012-04-30 DIAGNOSIS — M2550 Pain in unspecified joint: Secondary | ICD-10-CM

## 2012-04-30 DIAGNOSIS — N898 Other specified noninflammatory disorders of vagina: Secondary | ICD-10-CM

## 2012-04-30 DIAGNOSIS — E785 Hyperlipidemia, unspecified: Secondary | ICD-10-CM

## 2012-04-30 MED ORDER — METRONIDAZOLE 500 MG PO TABS: 500.0000 mg | ORAL_TABLET | Freq: Two times a day (BID) | ORAL | Status: DC

## 2012-04-30 MED ORDER — ATORVASTATIN CALCIUM 20 MG PO TABS: 20.0000 mg | ORAL_TABLET | Freq: Every day | ORAL | Status: DC

## 2012-04-30 NOTE — Patient Instructions (Signed)
Please schedule fasting labs approximately 6 weeks after beginning cholesterol medication Lipid/lft-272.4 and esr, rheumatoid factor-arthralgias and ck (non cardiac)-myalgia

## 2012-05-03 DIAGNOSIS — M2550 Pain in unspecified joint: Secondary | ICD-10-CM | POA: Insufficient documentation

## 2012-05-03 NOTE — Assessment & Plan Note (Signed)
Diffuse. Family history of rheumatoid arthritis. Obtain ESR and rheumatoid factor.

## 2012-05-03 NOTE — Progress Notes (Signed)
  Subjective:    Patient ID: Tanya Rush, female    DOB: 08/24/1945, 66 y.o.   MRN: [id number]  HPI Pt presents to clinic for followup of multiple medical problems. Previous vaginal discharge in April resolved with doxycycline. States has recently returned in a similar manner. Notes diffuse arthralgias without inflammatory changes such as swelling redness or prolonged morning stiffness. Notes multiple family members with history of rheumatoid arthritis. Weight stable. Blood pressure reviewed as normotensive. Cholesterol reviewed significantly elevated. Off Lipitor. No previous side effects.  Past Medical History  Diagnosis Date  . COPD (chronic obstructive pulmonary disease)     severe; O2 dependent-- followed by pulm at [facility]  . Asthma   . Genital herpes   . History of ovarian cyst    No past surgical history on file.  reports that she quit smoking about 5 years ago. She has never used smokeless tobacco. She reports that she does not drink alcohol or use illicit drugs. family history includes Alcohol abuse in her brother; Arthritis in her father; Heart disease in her mother; Lymphoma in her father; and Stroke in her mother.  There is no history of Cancer. Allergies  Allergen Reactions  . Aspirin     REACTION: sever chest pain      Review of Systems see hpi     Objective:   Physical Exam  Physical Exam  Nursing note and vitals reviewed. Constitutional: Appears well-developed and well-nourished. No distress.  HENT:  Head: Normocephalic and atraumatic.  Right Ear: External ear normal.  Left Ear: External ear normal.  Eyes: Conjunctivae are normal. No scleral icterus.  Neck: Neck supple. Carotid bruit is not present.  Cardiovascular: Normal rate, regular rhythm and normal heart sounds.  Exam reveals no gallop and no friction rub.   No murmur heard. Pulmonary/Chest: Effort normal and breath sounds normal. No respiratory distress. He has no wheezes. no rales.    Lymphadenopathy:    He has no cervical adenopathy.  Neurological:Alert.  Skin: Skin is warm and dry. Not diaphoretic.  Psychiatric: Has a normal mood and affect.        Assessment & Plan:

## 2012-05-03 NOTE — Assessment & Plan Note (Signed)
Suboptimal control. Resume Lipitor.

## 2012-05-03 NOTE — Assessment & Plan Note (Signed)
Attempt seven-day course of Flagyl. It becomes recurrent proceed with GYN consult

## 2012-05-07 ENCOUNTER — Ambulatory Visit: Payer: Medicare Other | Admitting: Internal Medicine

## 2012-08-28 ENCOUNTER — Telehealth: Payer: Self-pay | Admitting: *Deleted

## 2012-08-28 ENCOUNTER — Other Ambulatory Visit: Payer: Self-pay | Admitting: Family Medicine

## 2012-08-28 DIAGNOSIS — M791 Myalgia, unspecified site: Secondary | ICD-10-CM

## 2012-08-28 DIAGNOSIS — E785 Hyperlipidemia, unspecified: Secondary | ICD-10-CM

## 2012-08-28 DIAGNOSIS — M2550 Pain in unspecified joint: Secondary | ICD-10-CM

## 2012-08-28 LAB — LIPID PANEL
LDL Cholesterol: 101 mg/dL — ABNORMAL HIGH (ref 0–99)
Triglycerides: 123 mg/dL (ref <150)
VLDL: 25 mg/dL (ref 0–40)

## 2012-08-28 LAB — HEPATIC FUNCTION PANEL
Albumin: 4.2 g/dL (ref 3.5–5.2)
Alkaline Phosphatase: 54 U/L (ref 39–117)
Total Bilirubin: 0.3 mg/dL (ref 0.3–1.2)
Total Protein: 6.8 g/dL (ref 6.0–8.3)

## 2012-08-28 NOTE — Telephone Encounter (Signed)
Pt presented to the lab, orders entered per 04/2012 office note as below:  Please schedule fasting labs approximately 6 weeks after beginning cholesterol medication  Lipid/lft-272.4 and esr, rheumatoid factor-arthralgias and ck (non cardiac)-myalgia

## 2012-09-19 ENCOUNTER — Other Ambulatory Visit: Payer: Self-pay

## 2012-09-28 ENCOUNTER — Ambulatory Visit (INDEPENDENT_AMBULATORY_CARE_PROVIDER_SITE_OTHER): Payer: Medicare Other | Admitting: Family Medicine

## 2012-09-28 ENCOUNTER — Encounter: Payer: Self-pay | Admitting: Family Medicine

## 2012-09-28 VITALS — BP 142/86 | HR 88 | Temp 98.3°F | Ht 59.0 in | Wt 145.1 lb

## 2012-09-28 DIAGNOSIS — J449 Chronic obstructive pulmonary disease, unspecified: Secondary | ICD-10-CM

## 2012-09-28 DIAGNOSIS — E785 Hyperlipidemia, unspecified: Secondary | ICD-10-CM

## 2012-09-28 DIAGNOSIS — IMO0001 Reserved for inherently not codable concepts without codable children: Secondary | ICD-10-CM

## 2012-09-28 DIAGNOSIS — R030 Elevated blood-pressure reading, without diagnosis of hypertension: Secondary | ICD-10-CM

## 2012-09-28 HISTORY — DX: Reserved for inherently not codable concepts without codable children: IMO0001

## 2012-09-28 NOTE — Progress Notes (Signed)
Patient ID: Tanya Rush, female   DOB: 20-Mar-1946, 67 y.o.   MRN: [id number] ISHIA TENORIO [id number] Apr 16, 1946 09/28/2012      Progress Note-Follow Up  Subjective  Chief Complaint  Chief Complaint  Patient presents with  . Follow-up    5 month    HPI  Patient is a 67 year old Caucasian female who is in today for followup. Overall she reports doing well. She's recently been seen by her pulmonologist and been told her lung function has remained stable over this past year. She reports using her inhalers as prescribed. She denies any recent illness. Denies any wheezing but of course has some shortness or breath with exertion. No chest pain or palpitations, GI or GU complaints. No fevers or chills. Has occasional headaches but these are at her baseline.  Past Medical History  Diagnosis Date  . COPD (chronic obstructive pulmonary disease)     severe; O2 dependent-- followed by pulm at [facility]  . Asthma   . Genital herpes   . History of ovarian cyst   . Elevated BP 09/28/2012    History reviewed. No pertinent past surgical history.  Family History  Problem Relation Age of Onset  . Heart disease Mother   . Stroke Mother   . Arthritis Father     rheumatoid  . Lymphoma Father   . Alcohol abuse Brother   . Cancer Neg Hx     History   Social History  . Marital Status: Married    Spouse Name: N/A    Number of Children: 3  . Years of Education: N/A   Occupational History  . Not on file.   Social History Main Topics  . Smoking status: Former Smoker -- 45 years    Quit date: 08/05/2006  . Smokeless tobacco: Never Used  . Alcohol Use: No  . Drug Use: No  . Sexually Active: Not on file   Other Topics Concern  . Not on file   Social History Narrative   3 children: 2 adopted, 1 biological          Current Outpatient Prescriptions on File Prior to Visit  Medication Sig Dispense Refill  . albuterol (PROAIR HFA) 108 (90 BASE) MCG/ACT inhaler Inhale 2 puffs into the  lungs daily as needed.        Marland Kitchen atorvastatin (LIPITOR) 20 MG tablet Take 1 tablet (20 mg total) by mouth daily.  90 tablet  2  . Fluticasone-Salmeterol (ADVAIR DISKUS) 250-50 MCG/DOSE AEPB Inhale 1 puff into the lungs every 12 (twelve) hours.       . Multiple Vitamin (MULTIVITAMIN) tablet Take 1 tablet by mouth daily.      Marland Kitchen tiotropium (SPIRIVA) 18 MCG inhalation capsule Place 18 mcg into inhaler and inhale daily.        . valACYclovir (VALTREX) 1000 MG tablet Take 1 tablet (1,000 mg total) by mouth daily.  90 tablet  1   No current facility-administered medications on file prior to visit.    Allergies  Allergen Reactions  . Aspirin     REACTION: sever chest pain    Review of Systems  Review of Systems  Constitutional: Negative for fever and malaise/fatigue.  HENT: Negative for congestion.   Eyes: Negative for discharge.  Respiratory: Negative for shortness of breath.   Cardiovascular: Negative for chest pain, palpitations and leg swelling.  Gastrointestinal: Negative for nausea, abdominal pain and diarrhea.  Genitourinary: Negative for dysuria.  Musculoskeletal: Negative for falls.  Skin: Negative for rash.  Neurological: Negative for loss of consciousness and headaches.  Endo/Heme/Allergies: Negative for polydipsia.  Psychiatric/Behavioral: Negative for depression and suicidal ideas. The patient is not nervous/anxious and does not have insomnia.     Objective  BP 142/86  Pulse 88  Temp(Src) 98.3 F (36.8 C) (Oral)  Ht 4\' 11"  (1.499 m)  Wt 145 lb 1.9 oz (65.826 kg)  BMI 29.3 kg/m2  SpO2 94%  Physical Exam  Physical Exam  Constitutional: She is oriented to person, place, and time and well-developed, well-nourished, and in no distress. No distress.  HENT:  Head: Normocephalic and atraumatic.  Eyes: Conjunctivae are normal.  Neck: Neck supple. No thyromegaly present.  Cardiovascular: Normal rate and regular rhythm.  Exam reveals no gallop.   No murmur  heard. Distant heart sounds  Pulmonary/Chest: Effort normal and breath sounds normal. She has no wheezes.  O2 via [place]  Abdominal: She exhibits no distension and no mass.  Musculoskeletal: She exhibits no edema.  Lymphadenopathy:    She has no cervical adenopathy.  Neurological: She is alert and oriented to person, place, and time.  Skin: Skin is warm and dry. No rash noted. She is not diaphoretic.  Psychiatric: Memory, affect and judgment normal.    Lab Results  Component Value Date   TSH 0.59 06/28/2008   Lab Results  Component Value Date   WBC 10.6* 04/02/2011   HGB 12.7 04/02/2011   HCT 40.9 04/02/2011   MCV 103.0* 04/02/2011   PLT 220 04/02/2011   Lab Results  Component Value Date   CREATININE 0.63 04/28/2012   BUN 14 04/28/2012   NA 139 04/28/2012   K 4.5 04/28/2012   CL 99 04/28/2012   CO2 31 04/28/2012   Lab Results  Component Value Date   ALT 25 08/28/2012   AST 24 08/28/2012   ALKPHOS 54 08/28/2012   BILITOT 0.3 08/28/2012   Lab Results  Component Value Date   CHOL 186 08/28/2012   Lab Results  Component Value Date   HDL 60 08/28/2012   Lab Results  Component Value Date   LDLCALC 101* 08/28/2012   Lab Results  Component Value Date   TRIG 123 08/28/2012   Lab Results  Component Value Date   CHOLHDL 3.1 08/28/2012     Assessment & Plan  Other and unspecified hyperlipidemia Improved on LIpitor, avoid trans fats. Discussed fact that she may have to change statins for cost reasons. Will call if she decides to switch  COPD She reports her annual exam was recently and she was told she has not had any loss of lung function this year  Elevated BP Improved on recheck, minimize sodium abd caffeine

## 2012-09-28 NOTE — Patient Instructions (Addendum)
Labs prior to visit, lipid, renal, hepatic, cbc, tsh  Hypercholesterolemia High Blood Cholesterol Cholesterol is a white, waxy, fat-like protein needed by your body in small amounts. The liver makes all the cholesterol you need. It is carried from the liver by the blood through the blood vessels. Deposits (plaque) may build up on blood vessel walls. This makes the arteries narrower and stiffer. Plaque increases the risk for heart attack and stroke. You cannot feel your cholesterol level even if it is very high. The only way to know is by a blood test to check your lipid (fats) levels. Once you know your cholesterol levels, you should keep a record of the test results. Work with your caregiver to to keep your levels in the desired range. WHAT THE RESULTS MEAN:  Total cholesterol is a rough measure of all the cholesterol in your blood.  LDL is the so-called bad cholesterol. This is the type that deposits cholesterol in the walls of the arteries. You want this level to be low.  HDL is the good cholesterol because it cleans the arteries and carries the LDL away. You want this level to be high.  Triglycerides are fat that the body can either burn for energy or store. High levels are closely linked to heart disease. DESIRED LEVELS:  Total cholesterol below 200.  LDL below 100 for people at risk, below 70 for very high risk.  HDL above 50 is good, above 60 is best.  Triglycerides below 150. HOW TO LOWER YOUR CHOLESTEROL:  Diet.  Choose fish or white meat chicken and [place], roasted or baked. Limit fatty cuts of red meat, fried foods, and processed meats, such as sausage and lunch meat.  Eat lots of fresh fruits and vegetables. Choose whole grains, beans, pasta, potatoes and cereals.  Use only small amounts of olive, corn or canola oils. Avoid butter, mayonnaise, shortening or palm kernel oils. Avoid foods with trans-fats.  Use skim/nonfat milk and low-fat/nonfat yogurt and cheeses. Avoid  whole milk, cream, ice cream, egg yolks and cheeses. Healthy desserts include angel food cake, gingersnaps, animal crackers, hard candy, popsicles, and low-fat/nonfat frozen yogurt. Avoid pastries, cakes, pies and cookies.  Exercise.  A regular program helps decrease LDL and raises HDL.  Helps with weight control.  Do things that increase your activity level like gardening, walking, or taking the stairs.  Medication.  May be prescribed by your caregiver to help lowering cholesterol and the risk for heart disease.  You may need medicine even if your levels are normal if you have several risk factors. HOME CARE INSTRUCTIONS   Follow your diet and exercise programs as suggested by your caregiver.  Take medications as directed.  Have blood work done when your caregiver feels it is necessary. MAKE SURE YOU:   Understand these instructions.  Will watch your condition.  Will get help right away if you are not doing well or get worse. Document Released: 07/22/2005 Document Revised: 10/14/2011 Document Reviewed: 01/07/2007 [facility] Patient Information 2013 [place], [place].

## 2012-09-28 NOTE — Assessment & Plan Note (Signed)
Improved on LIpitor, avoid trans fats. Discussed fact that she may have to change statins for cost reasons. Will call if she decides to switch

## 2012-09-28 NOTE — Assessment & Plan Note (Signed)
Improved on recheck, minimize sodium abd caffeine

## 2012-09-28 NOTE — Assessment & Plan Note (Addendum)
She reports her annual exam was recently and she was told she has not had any loss of lung function this year

## 2012-10-06 ENCOUNTER — Other Ambulatory Visit: Payer: Self-pay | Admitting: Internal Medicine

## 2013-01-14 ENCOUNTER — Ambulatory Visit (INDEPENDENT_AMBULATORY_CARE_PROVIDER_SITE_OTHER): Payer: Medicare Other | Admitting: Family Medicine

## 2013-01-14 ENCOUNTER — Encounter: Payer: Self-pay | Admitting: Family Medicine

## 2013-01-14 VITALS — BP 154/100 | HR 99 | Temp 98.4°F | Ht 59.0 in | Wt 133.1 lb

## 2013-01-14 DIAGNOSIS — R030 Elevated blood-pressure reading, without diagnosis of hypertension: Secondary | ICD-10-CM

## 2013-01-14 DIAGNOSIS — B86 Scabies: Secondary | ICD-10-CM

## 2013-01-14 DIAGNOSIS — IMO0001 Reserved for inherently not codable concepts without codable children: Secondary | ICD-10-CM

## 2013-01-14 MED ORDER — PERMETHRIN 5 % EX CREA: TOPICAL_CREAM | Freq: Once | CUTANEOUS | Status: DC

## 2013-01-14 NOTE — Patient Instructions (Addendum)
Try Witch Hazel Astringent  Scabies Scabies are small bugs (mites) that burrow under the skin and cause red bumps and severe itching. These bugs can only be seen with a microscope. Scabies are highly contagious. They can spread easily from person to person by direct contact. They are also spread through sharing clothing or linens that have the scabies mites living in them. It is not unusual for an entire family to become infected through shared towels, clothing, or bedding.  HOME CARE INSTRUCTIONS   Your caregiver may prescribe a cream or lotion to kill the mites. If cream is prescribed, massage the cream into the entire body from the neck to the bottom of both feet. Also massage the cream into the scalp and face if your child is less than 4 year old. Avoid the eyes and mouth. Do not wash your hands after application.  Leave the cream on for 8 to 12 hours. Your child should bathe or shower after the 8 to 12 hour application period. Sometimes it is helpful to apply the cream to your child right before bedtime.  One treatment is usually effective and will eliminate approximately 95% of infestations. For severe cases, your caregiver may decide to repeat the treatment in 1 week. Everyone in your household should be treated with one application of the cream.  New rashes or burrows should not appear within 24 to 48 hours after successful treatment. However, the itching and rash may last for 2 to 4 weeks after successful treatment. Your caregiver may prescribe a medicine to help with the itching or to help the rash go away more quickly.  Scabies can live on clothing or linens for up to 3 days. All of your child's recently used clothing, towels, stuffed toys, and bed linens should be washed in hot water and then dried in a dryer for at least 20 minutes on high heat. Items that cannot be washed should be enclosed in a plastic bag for at least 3 days.  To help relieve itching, bathe your child in a cool bath  or apply cool washcloths to the affected areas.  Your child may return to school after treatment with the prescribed cream. SEEK MEDICAL CARE IF:   The itching persists longer than 4 weeks after treatment.  The rash spreads or becomes infected. Signs of infection include red blisters or yellow-tan crust. Document Released: 07/22/2005 Document Revised: 10/14/2011 Document Reviewed: 11/30/2008 [facility] Patient Information 2014 [place], [place].

## 2013-01-16 ENCOUNTER — Encounter: Payer: Self-pay | Admitting: Family Medicine

## 2013-01-16 DIAGNOSIS — B86 Scabies: Secondary | ICD-10-CM

## 2013-01-16 HISTORY — DX: Scabies: B86

## 2013-01-16 NOTE — Progress Notes (Signed)
Patient ID: Tanya Rush, female   DOB: [birth year], 67 y.o.   MRN: [id number] Tanya Rush [id number] 01/19/1946 01/16/2013      Progress Note-Follow Up  Subjective  Chief Complaint  Chief Complaint  Patient presents with  . Rash    all over body for a little over 1 week- itchy    HPI  Patient is a 67 year old Caucasian female who is in today with a one-week history of pruritic rash. There is scattered lesions throughout her trunk and extremities. She's not had any recent travel but her husband has. No fevers or chills. No malaise or myalgias. No chest pain, palpitations, shortness of breath, GI or GU complaints  Past Medical History  Diagnosis Date  . COPD (chronic obstructive pulmonary disease)     severe; O2 dependent-- followed by pulm at [facility]  . Asthma   . Genital herpes   . History of ovarian cyst   . Elevated BP 09/28/2012  . Scabies 01/16/2013    No past surgical history on file.  Family History  Problem Relation Age of Onset  . Heart disease Mother   . Stroke Mother   . Arthritis Father     rheumatoid  . Lymphoma Father   . Alcohol abuse Brother   . Cancer Neg Hx     History   Social History  . Marital Status: Married    Spouse Name: N/A    Number of Children: 3  . Years of Education: N/A   Occupational History  . Not on file.   Social History Main Topics  . Smoking status: Former Smoker -- 45 years    Quit date: 08/05/2006  . Smokeless tobacco: Never Used  . Alcohol Use: No  . Drug Use: No  . Sexually Active: Not on file   Other Topics Concern  . Not on file   Social History Narrative   3 children: 2 adopted, 1 biological          Current Outpatient Prescriptions on File Prior to Visit  Medication Sig Dispense Refill  . albuterol (PROAIR HFA) 108 (90 BASE) MCG/ACT inhaler Inhale 2 puffs into the lungs daily as needed.        Marland Kitchen atorvastatin (LIPITOR) 20 MG tablet Take 1 tablet (20 mg total) by mouth daily.  90 tablet  2  .  Fluticasone-Salmeterol (ADVAIR DISKUS) 250-50 MCG/DOSE AEPB Inhale 1 puff into the lungs every 12 (twelve) hours.       . Multiple Vitamin (MULTIVITAMIN) tablet Take 1 tablet by mouth daily.      Marland Kitchen oxybutynin (OXYTROL) 3.9 MG/24HR Place 1 patch onto the skin as directed. Change patch every 4 days      . tiotropium (SPIRIVA) 18 MCG inhalation capsule Place 18 mcg into inhaler and inhale daily.        . valACYclovir (VALTREX) 1000 MG tablet TAKE 1 TABLET BY MOUTH DAILY.  90 tablet  1   No current facility-administered medications on file prior to visit.    Allergies  Allergen Reactions  . Aspirin     REACTION: sever chest pain    Review of Systems  Review of Systems  Constitutional: Negative for fever and malaise/fatigue.  HENT: Negative for congestion.   Eyes: Negative for discharge.  Respiratory: Negative for shortness of breath.   Cardiovascular: Negative for chest pain, palpitations and leg swelling.  Gastrointestinal: Negative for nausea, abdominal pain and diarrhea.  Genitourinary: Negative for dysuria.  Musculoskeletal: Negative for falls.  Skin: Positive  for itching and rash.  Neurological: Negative for loss of consciousness and headaches.  Endo/Heme/Allergies: Negative for polydipsia.  Psychiatric/Behavioral: Negative for depression and suicidal ideas. The patient is not nervous/anxious and does not have insomnia.     Objective  BP 154/100  Pulse 99  Temp(Src) 98.4 F (36.9 C) (Oral)  Ht 4\' 11"  (1.499 m)  Wt 133 lb 1.3 oz (60.365 kg)  BMI 26.86 kg/m2  SpO2 92%  Physical Exam  Physical Exam  Constitutional: She is oriented to person, place, and time and well-developed, well-nourished, and in no distress. No distress.  HENT:  Head: Normocephalic and atraumatic.  Eyes: Conjunctivae are normal.  Neck: Neck supple. No thyromegaly present.  Cardiovascular: Normal rate, regular rhythm and normal heart sounds.   No murmur heard. Pulmonary/Chest: Effort normal and  breath sounds normal. She has no wheezes.  Abdominal: She exhibits no distension and no mass.  Musculoskeletal: She exhibits no edema.  Lymphadenopathy:    She has no cervical adenopathy.  Neurological: She is alert and oriented to person, place, and time.  Skin: Skin is warm and dry. Rash noted. She is not diaphoretic. There is erythema.  Diffuse papular lesions, erythematous on trunk and also on extremities and behind joints.  Psychiatric: Memory, affect and judgment normal.    Lab Results  Component Value Date   TSH 0.59 06/28/2008   Lab Results  Component Value Date   WBC 10.6* 04/02/2011   HGB 12.7 04/02/2011   HCT 40.9 04/02/2011   MCV 103.0* 04/02/2011   PLT 220 04/02/2011   Lab Results  Component Value Date   CREATININE 0.63 04/28/2012   BUN 14 04/28/2012   NA 139 04/28/2012   K 4.5 04/28/2012   CL 99 04/28/2012   CO2 31 04/28/2012   Lab Results  Component Value Date   ALT 25 08/28/2012   AST 24 08/28/2012   ALKPHOS 54 08/28/2012   BILITOT 0.3 08/28/2012   Lab Results  Component Value Date   CHOL 186 08/28/2012   Lab Results  Component Value Date   HDL 60 08/28/2012   Lab Results  Component Value Date   LDLCALC 101* 08/28/2012   Lab Results  Component Value Date   TRIG 123 08/28/2012   Lab Results  Component Value Date   CHOLHDL 3.1 08/28/2012     Assessment & Plan  Elevated BP Elevated with itching and anxiety, will monitor. No changes today  Scabies Given an rx for Permethrin cream to apply qhs and wash off in am. Deep clean the house and let us know if no improvement so we can refer to dermatology

## 2013-01-16 NOTE — Assessment & Plan Note (Signed)
Given an rx for Permethrin cream to apply qhs and wash off in am. Deep clean the house and let us know if no improvement so we can refer to dermatology

## 2013-01-16 NOTE — Assessment & Plan Note (Addendum)
Elevated with itching and anxiety, will monitor. No changes today

## 2013-02-06 ENCOUNTER — Encounter: Payer: Self-pay | Admitting: Family Medicine

## 2013-02-06 ENCOUNTER — Other Ambulatory Visit: Payer: Self-pay | Admitting: Internal Medicine

## 2013-02-08 NOTE — Telephone Encounter (Signed)
Rx request to pharmacy/SLS  

## 2013-02-08 NOTE — Telephone Encounter (Signed)
Please advise 

## 2013-02-10 ENCOUNTER — Ambulatory Visit (INDEPENDENT_AMBULATORY_CARE_PROVIDER_SITE_OTHER): Payer: Medicare Other | Admitting: Nurse Practitioner

## 2013-02-10 ENCOUNTER — Encounter: Payer: Self-pay | Admitting: Nurse Practitioner

## 2013-02-10 VITALS — BP 150/102 | HR 63 | Temp 97.8°F | Resp 16 | Wt 136.0 lb

## 2013-02-10 DIAGNOSIS — I10 Essential (primary) hypertension: Secondary | ICD-10-CM

## 2013-02-10 MED ORDER — HYDROCHLOROTHIAZIDE 12.5 MG PO TABS: 12.5000 mg | ORAL_TABLET | Freq: Every day | ORAL | Status: DC

## 2013-02-10 NOTE — Progress Notes (Signed)
  Subjective:    Patient ID: Tanya Rush, female    DOB: Feb 20, 1946, 67 y.o.   MRN: [id number]  Hypertension This is a recurrent problem. The current episode started 1 to 4 weeks ago (recent visit at ED for fall, showed BP at 226/113). The problem has been gradually worsening since onset. The problem is uncontrolled (no meds currently). Associated symptoms include headaches (more frequent HA for last year, especially in morning), peripheral edema (occasional feet swelling) and shortness of breath (has COPD, using continuous oxygen). Pertinent negatives include no chest pain or palpitations. Associated agents: using oxybutynin. Risk factors for coronary artery disease include dyslipidemia. Past treatments include nothing. has COPD, denies sleep apnea.      Review of Systems  Constitutional: Negative for activity change and appetite change.  HENT: Negative for nosebleeds and tinnitus.   Eyes: Negative for visual disturbance.       Last eye exam 2 years ago  Respiratory: Positive for shortness of breath (has COPD, using continuous oxygen). Negative for cough.   Cardiovascular: Positive for leg swelling (ocasional foot swelling bilat). Negative for chest pain and palpitations.  Genitourinary:       Taking oxybutynin for nocturia-was getting up 5-6 times at night, now about 3 times at night.  Neurological: Positive for headaches (more frequent HA for last year, especially in morning). Negative for dizziness, tremors, facial asymmetry, weakness and numbness.       Objective:   Physical Exam  Vitals reviewed. Constitutional: She is oriented to person, place, and time. She appears well-developed and well-nourished. No distress.  Accompanied by husband, using continuous oxygen, takes breath every 6-7 words.   HENT:  Head: Normocephalic and atraumatic.  Eyes: Conjunctivae are normal.  Cardiovascular: Normal rate, regular rhythm, normal heart sounds and intact distal pulses.   Pulmonary/Chest:   Exp phase longer than insp phase, Oxygen by nasal cannula, dim breath sounds throughout.  Musculoskeletal: She exhibits no edema.  Barrel chest  Neurological: She is alert and oriented to person, place, and time.  Skin: Skin is warm and dry.  Psychiatric: She has a normal mood and affect. Her behavior is normal. Thought content normal.          Assessment & Plan:  1. Essential hypertension, benign May be r/t oxybutynin-started about 6 mos. Ago. Pt really likes that she goes to bathroom 1/2 as much during night as used to, but may be contributing to elevated BP. Also pt reports HA at night when wakes from sleep, and in am, resolves during day. This could be SE of oxybutynin or retained CO2 at night-suggested she make appt. W/pulm to eval. F/u in 1 week for bp check & med SE eval. - hydrochlorothiazide (HYDRODIURIL) 12.5 MG tablet; Take 1 tablet (12.5 mg total) by mouth daily.  Dispense: 30 tablet; Refill: 0

## 2013-02-10 NOTE — Patient Instructions (Addendum)
Please follow up with Dr. Abner Greenspan next week. This medicine to control blood pressure may need to be adjusted. Pleasure to meet you!

## 2013-02-16 ENCOUNTER — Encounter: Payer: Self-pay | Admitting: Family Medicine

## 2013-02-16 ENCOUNTER — Ambulatory Visit (INDEPENDENT_AMBULATORY_CARE_PROVIDER_SITE_OTHER): Payer: Medicare Other | Admitting: Family Medicine

## 2013-02-16 VITALS — BP 124/82 | HR 92 | Temp 98.0°F | Ht 59.0 in | Wt 138.0 lb

## 2013-02-16 DIAGNOSIS — IMO0001 Reserved for inherently not codable concepts without codable children: Secondary | ICD-10-CM

## 2013-02-16 DIAGNOSIS — J449 Chronic obstructive pulmonary disease, unspecified: Secondary | ICD-10-CM

## 2013-02-16 DIAGNOSIS — R7981 Abnormal blood-gas level: Secondary | ICD-10-CM

## 2013-02-16 DIAGNOSIS — R030 Elevated blood-pressure reading, without diagnosis of hypertension: Secondary | ICD-10-CM

## 2013-02-16 DIAGNOSIS — I10 Essential (primary) hypertension: Secondary | ICD-10-CM

## 2013-02-16 DIAGNOSIS — B86 Scabies: Secondary | ICD-10-CM

## 2013-02-16 MED ORDER — HYDROCHLOROTHIAZIDE 12.5 MG PO TABS: 12.5000 mg | ORAL_TABLET | Freq: Every day | ORAL | Status: DC

## 2013-02-16 NOTE — Patient Instructions (Addendum)

## 2013-02-17 LAB — RENAL FUNCTION PANEL
Albumin: 4.2 g/dL (ref 3.5–5.2)
BUN: 17 mg/dL (ref 6–23)
CO2: 39 mEq/L — ABNORMAL HIGH (ref 19–32)
Glucose, Bld: 103 mg/dL — ABNORMAL HIGH (ref 70–99)
Potassium: 4.1 mEq/L (ref 3.5–5.3)
Sodium: 140 mEq/L (ref 135–145)

## 2013-02-17 NOTE — Assessment & Plan Note (Signed)
Rash resolved without use of Permethrin cream.

## 2013-02-17 NOTE — Progress Notes (Signed)
Quick Note:  Patient Informed and voiced understanding and lab order placed ______

## 2013-02-17 NOTE — Assessment & Plan Note (Signed)
Improved at today's visit. Continue current meds.

## 2013-02-17 NOTE — Progress Notes (Signed)
Patient ID: Tanya Rush, female   DOB: 09-Jan-1946, 67 y.o.   MRN: [id number] FLORENTINA MARQUART [id number] 04/17/1946 02/17/2013      Progress Note-Follow Up  Subjective  Chief Complaint  Chief Complaint  Patient presents with  . Follow-up    1 week     HPI  Patient is a 67 year old Caucasian female in today for followup. Last week she was struggling with significantly elevated blood pressure and HCTZ was started. She is tolerated that well other than some increased urination initially she feels well today. She denies chest pain or palpitations. Has her persistent level of dyspnea but is not worsening. No fevers chills, headache GI or GU concerns otherwise noted. Her rash resolved without treatment  Past Medical History  Diagnosis Date  . COPD (chronic obstructive pulmonary disease)     severe; O2 dependent-- followed [facility] pulm at [facility]  . Asthma   . Genital herpes   . History of ovarian cyst   . Elevated BP 09/28/2012  . Scabies 01/16/2013    No past surgical history on file.  Family History  Problem Relation Age of Onset  . Heart disease Mother   . Stroke Mother   . Arthritis Father     rheumatoid  . Lymphoma Father   . Alcohol abuse Brother   . Cancer Neg Hx   . Alcohol abuse Son   . Alcohol abuse Son     recovering     History   Social History  . Marital Status: Married    Spouse Name: N/A    Number of Children: 3  . Years of Education: N/A   Occupational History  . Not on file.   Social History Main Topics  . Smoking status: Former Smoker -- 45 years    Quit date: 08/05/2006  . Smokeless tobacco: Never Used  . Alcohol Use: No  . Drug Use: No  . Sexually Active: Not on file   Other Topics Concern  . Not on file   Social History Narrative   3 children: 2 adopted, 1 biological          Current Outpatient Prescriptions on File Prior to Visit  Medication Sig Dispense Refill  . albuterol (PROAIR HFA) 108 (90 BASE) MCG/ACT inhaler Inhale 2 puffs  into the lungs daily as needed.        Marland Kitchen atorvastatin (LIPITOR) 20 MG tablet TAKE 1 TABLET (20 MG TOTAL) BY MOUTH DAILY.  90 tablet  0  . Fluticasone-Salmeterol (ADVAIR DISKUS) 250-50 MCG/DOSE AEPB Inhale 1 puff into the lungs every 12 (twelve) hours.       . Multiple Vitamin (MULTIVITAMIN) tablet Take 1 tablet by mouth daily.      Marland Kitchen oxybutynin (OXYTROL) 3.9 MG/24HR Place 1 patch onto the skin as directed. Change patch every 4 days      . tiotropium (SPIRIVA) 18 MCG inhalation capsule Place 18 mcg into inhaler and inhale daily.        . valACYclovir (VALTREX) 1000 MG tablet TAKE 1 TABLET BY MOUTH DAILY.  90 tablet  1   No current facility-administered medications on file prior to visit.    Allergies  Allergen Reactions  . Aspirin     REACTION: sever chest pain    Review of Systems  Review of Systems  Constitutional: Negative for fever and malaise/fatigue.  HENT: Negative for congestion.   Eyes: Negative for pain and discharge.  Respiratory: Positive for shortness of breath.   Cardiovascular: Negative for chest  pain, palpitations and leg swelling.  Gastrointestinal: Negative for nausea, abdominal pain and diarrhea.  Genitourinary: Negative for dysuria.  Musculoskeletal: Negative for falls.  Skin: Negative for rash.  Neurological: Negative for loss of consciousness and headaches.  Endo/Heme/Allergies: Negative for polydipsia.  Psychiatric/Behavioral: Negative for depression and suicidal ideas. The patient is not nervous/anxious and does not have insomnia.     Objective  BP 124/82  Pulse 92  Temp(Src) 98 F (36.7 C) (Oral)  Ht 4\' 11"  (1.499 m)  Wt 138 lb 0.6 oz (62.615 kg)  BMI 27.87 kg/m2  SpO2 94%  Physical Exam  Physical Exam  Constitutional: She is oriented to person, place, and time and well-developed, well-nourished, and in no distress. No distress.  HENT:  Head: Normocephalic and atraumatic.  Eyes: Conjunctivae are normal.  Neck: Neck supple. No thyromegaly  present.  Cardiovascular: Normal rate, regular rhythm and normal heart sounds.  Exam reveals no gallop.   No murmur heard. Pulmonary/Chest: Effort normal and breath sounds normal. She has no wheezes.  Abdominal: She exhibits no distension and no mass.  Musculoskeletal: She exhibits no edema.  Lymphadenopathy:    She has no cervical adenopathy.  Neurological: She is alert and oriented to person, place, and time.  Skin: Skin is warm and dry. No rash noted. She is not diaphoretic.  Psychiatric: Memory, affect and judgment normal.    Lab Results  Component Value Date   TSH 0.59 06/28/2008   Lab Results  Component Value Date   WBC 10.6* 04/02/2011   HGB 12.7 04/02/2011   HCT 40.9 04/02/2011   MCV 103.0* 04/02/2011   PLT 220 04/02/2011   Lab Results  Component Value Date   CREATININE 0.70 02/16/2013   BUN 17 02/16/2013   NA 140 02/16/2013   K 4.1 02/16/2013   CL 95* 02/16/2013   CO2 39* 02/16/2013   Lab Results  Component Value Date   ALT 25 08/28/2012   AST 24 08/28/2012   ALKPHOS 54 08/28/2012   BILITOT 0.3 08/28/2012   Lab Results  Component Value Date   CHOL 186 08/28/2012   Lab Results  Component Value Date   HDL 60 08/28/2012   Lab Results  Component Value Date   LDLCALC 101* 08/28/2012   Lab Results  Component Value Date   TRIG 123 08/28/2012   Lab Results  Component Value Date   CHOLHDL 3.1 08/28/2012     Assessment & Plan  Elevated BP Improved at today's visit. Continue current meds.   Scabies Rash resolved without use of Permethrin cream.   COPD On continuous O2 and feeling well today.

## 2013-02-17 NOTE — Assessment & Plan Note (Signed)
On continuous O2 and feeling well today.

## 2013-02-23 LAB — RENAL FUNCTION PANEL
Albumin: 4.2 g/dL (ref 3.5–5.2)
BUN: 15 mg/dL (ref 6–23)
CO2: 32 meq/L (ref 19–32)
Calcium: 9.3 mg/dL (ref 8.4–10.5)
Chloride: 97 meq/L (ref 96–112)
Creat: 0.7 mg/dL (ref 0.50–1.10)
Glucose, Bld: 146 mg/dL — ABNORMAL HIGH (ref 70–99)
Phosphorus: 4.1 mg/dL (ref 2.3–4.6)
Potassium: 4.1 meq/L (ref 3.5–5.3)
Sodium: 137 meq/L (ref 135–145)

## 2013-03-07 ENCOUNTER — Encounter: Payer: Self-pay | Admitting: Family Medicine

## 2013-03-07 DIAGNOSIS — I10 Essential (primary) hypertension: Secondary | ICD-10-CM

## 2013-03-08 MED ORDER — HYDROCHLOROTHIAZIDE 12.5 MG PO TABS: 12.5000 mg | ORAL_TABLET | Freq: Every day | ORAL | Status: DC

## 2013-03-10 ENCOUNTER — Other Ambulatory Visit: Payer: Self-pay

## 2013-03-23 ENCOUNTER — Telehealth: Payer: Self-pay | Admitting: *Deleted

## 2013-03-23 DIAGNOSIS — E785 Hyperlipidemia, unspecified: Secondary | ICD-10-CM

## 2013-03-23 DIAGNOSIS — J449 Chronic obstructive pulmonary disease, unspecified: Secondary | ICD-10-CM

## 2013-03-23 DIAGNOSIS — I10 Essential (primary) hypertension: Secondary | ICD-10-CM

## 2013-03-23 DIAGNOSIS — Z79899 Other long term (current) drug therapy: Secondary | ICD-10-CM

## 2013-03-23 LAB — CBC WITH DIFFERENTIAL/PLATELET
Basophils Absolute: 0 10*3/uL (ref 0.0–0.1)
Basophils Relative: 0 % (ref 0–1)
Eosinophils Absolute: 0.4 10*3/uL (ref 0.0–0.7)
MCHC: 32.8 g/dL (ref 30.0–36.0)
Neutro Abs: 4.4 10*3/uL (ref 1.7–7.7)
Neutrophils Relative %: 52 % (ref 43–77)
RDW: 14.7 % (ref 11.5–15.5)

## 2013-03-23 LAB — BASIC METABOLIC PANEL
BUN: 14 mg/dL (ref 6–23)
CO2: 39 mEq/L — ABNORMAL HIGH (ref 19–32)
Calcium: 9.2 mg/dL (ref 8.4–10.5)
Creat: 0.7 mg/dL (ref 0.50–1.10)
Glucose, Bld: 97 mg/dL (ref 70–99)

## 2013-03-23 LAB — HEPATIC FUNCTION PANEL
ALT: 13 U/L (ref 0–35)
Alkaline Phosphatase: 55 U/L (ref 39–117)
Bilirubin, Direct: <0.1 mg/dL (ref 0.0–0.3)
Total Protein: 6.9 g/dL (ref 6.0–8.3)

## 2013-03-23 LAB — LIPID PANEL
Cholesterol: 173 mg/dL (ref 0–200)
LDL Cholesterol: 92 mg/dL (ref 0–99)
Triglycerides: 123 mg/dL (ref <150)

## 2013-03-23 NOTE — Telephone Encounter (Signed)
Patient presented to lab today pre 08.25.14 OV for 6-mth F/U; entered labs following previously ordered on follow-up visits, as not stated in AVS/SLS

## 2013-03-29 ENCOUNTER — Ambulatory Visit: Payer: Medicare Other | Admitting: Family Medicine

## 2013-03-29 ENCOUNTER — Ambulatory Visit (INDEPENDENT_AMBULATORY_CARE_PROVIDER_SITE_OTHER): Payer: Medicare Other | Admitting: Family Medicine

## 2013-03-29 ENCOUNTER — Telehealth: Payer: Self-pay | Admitting: Family Medicine

## 2013-03-29 ENCOUNTER — Encounter: Payer: Self-pay | Admitting: Family Medicine

## 2013-03-29 VITALS — BP 140/84 | HR 88 | Temp 97.8°F | Ht 59.0 in | Wt 136.1 lb

## 2013-03-29 DIAGNOSIS — J449 Chronic obstructive pulmonary disease, unspecified: Secondary | ICD-10-CM

## 2013-03-29 DIAGNOSIS — IMO0001 Reserved for inherently not codable concepts without codable children: Secondary | ICD-10-CM

## 2013-03-29 DIAGNOSIS — E785 Hyperlipidemia, unspecified: Secondary | ICD-10-CM

## 2013-03-29 DIAGNOSIS — D239 Other benign neoplasm of skin, unspecified: Secondary | ICD-10-CM

## 2013-03-29 DIAGNOSIS — R030 Elevated blood-pressure reading, without diagnosis of hypertension: Secondary | ICD-10-CM

## 2013-03-29 DIAGNOSIS — L578 Other skin changes due to chronic exposure to nonionizing radiation: Secondary | ICD-10-CM

## 2013-03-29 DIAGNOSIS — R7981 Abnormal blood-gas level: Secondary | ICD-10-CM

## 2013-03-29 NOTE — Assessment & Plan Note (Addendum)
Doing well at present time. Patient will report worsening symptoms

## 2013-03-29 NOTE — Telephone Encounter (Signed)
Lab order      Lipid, renal, cbc tsh hepatic    For February 2015

## 2013-03-29 NOTE — Assessment & Plan Note (Signed)
Well controlled, no changes 

## 2013-03-29 NOTE — Assessment & Plan Note (Signed)
Well controlled no changes 

## 2013-04-04 ENCOUNTER — Encounter: Payer: Self-pay | Admitting: Family Medicine

## 2013-04-04 DIAGNOSIS — L578 Other skin changes due to chronic exposure to nonionizing radiation: Secondary | ICD-10-CM

## 2013-04-04 HISTORY — DX: Other skin changes due to chronic exposure to nonionizing radiation: L57.8

## 2013-04-04 NOTE — Assessment & Plan Note (Signed)
Patient notes a lesion she has had on her back for years has been enlarging. Is referred to dermatology for further consideration.

## 2013-04-04 NOTE — Progress Notes (Signed)
Patient ID: Tanya Rush, female   DOB: 1945-12-24, 67 y.o.   MRN: [id number] TAMETRA AHART [id number] [birth year] 04/04/2013      Progress Note-Follow Up  Subjective  Chief Complaint  Chief Complaint  Patient presents with  . Follow-up    6 month    HPI  Patient is a 67 year old female in today for followup. Her biggest concern is of lesion on her back which she has had for years. She believes it is changing. She denies bleeding or itching. No other complaints. No fatigue or malaise. No anorexia, headache, fevers or recent illness. No chest pain, palpitations, shortness of breath, GI or GU concerns at this time. COPD has been stable.  Past Medical History  Diagnosis Date  . COPD (chronic obstructive pulmonary disease)     severe; O2 dependent-- followed by pulm at [facility]  . Asthma   . Genital herpes   . History of ovarian cyst   . Elevated BP 09/28/2012  . Scabies 01/16/2013  . Sun-damaged skin 04/04/2013    History reviewed. No pertinent past surgical history.  Family History  Problem Relation Age of Onset  . Heart disease Mother   . Stroke Mother   . Arthritis Father     rheumatoid  . Lymphoma Father   . Alcohol abuse Brother   . Cancer Neg Hx   . Alcohol abuse Son   . Alcohol abuse Son     recovering     History   Social History  . Marital Status: Married    Spouse Name: N/A    Number of Children: 3  . Years of Education: N/A   Occupational History  . Not on file.   Social History Main Topics  . Smoking status: Former Smoker -- 45 years    Quit date: 08/05/2006  . Smokeless tobacco: Never Used  . Alcohol Use: No  . Drug Use: No  . Sexual Activity: Not on file   Other Topics Concern  . Not on file   Social History Narrative   3 children: 2 adopted, 1 biological          Current Outpatient Prescriptions on File Prior to Visit  Medication Sig Dispense Refill  . albuterol (PROAIR HFA) 108 (90 BASE) MCG/ACT inhaler Inhale 2 puffs into the lungs  daily as needed.        Marland Kitchen atorvastatin (LIPITOR) 20 MG tablet TAKE 1 TABLET (20 MG TOTAL) BY MOUTH DAILY.  90 tablet  0  . Fluticasone-Salmeterol (ADVAIR DISKUS) 250-50 MCG/DOSE AEPB Inhale 1 puff into the lungs every 12 (twelve) hours.       . hydrochlorothiazide (HYDRODIURIL) 12.5 MG tablet Take 1 tablet (12.5 mg total) by mouth daily.  90 tablet  1  . Multiple Vitamin (MULTIVITAMIN) tablet Take 1 tablet by mouth daily.      Marland Kitchen oxybutynin (OXYTROL) 3.9 MG/24HR Place 1 patch onto the skin as directed. Change patch every 4 days      . tiotropium (SPIRIVA) 18 MCG inhalation capsule Place 18 mcg into inhaler and inhale daily.        . valACYclovir (VALTREX) 1000 MG tablet TAKE 1 TABLET BY MOUTH DAILY.  90 tablet  1   No current facility-administered medications on file prior to visit.    Allergies  Allergen Reactions  . Aspirin     REACTION: sever chest pain    Review of Systems  Review of Systems  Constitutional: Negative for fever and malaise/fatigue.  HENT: Negative for  congestion.   Eyes: Negative for discharge.  Respiratory: Negative for shortness of breath.   Cardiovascular: Negative for chest pain, palpitations and leg swelling.  Gastrointestinal: Negative for nausea, abdominal pain and diarrhea.  Genitourinary: Negative for dysuria.  Musculoskeletal: Negative for falls.  Skin: Negative for rash.  Neurological: Negative for loss of consciousness and headaches.  Endo/Heme/Allergies: Negative for polydipsia.  Psychiatric/Behavioral: Negative for depression and suicidal ideas. The patient is not nervous/anxious and does not have insomnia.     Objective  BP 140/84  Pulse 88  Temp(Src) 97.8 F (36.6 C) (Oral)  Ht 4\' 11"  (1.499 m)  Wt 136 lb 1.9 oz (61.744 kg)  BMI 27.48 kg/m2  SpO2 96%  Physical Exam  Physical Exam  Constitutional: She is oriented to person, place, and time and well-developed, well-nourished, and in no distress. No distress.  HENT:  Head:  Normocephalic and atraumatic.  Eyes: Conjunctivae are normal.  Neck: Neck supple. No thyromegaly present.  Cardiovascular: Normal rate, regular rhythm and normal heart sounds.   No murmur heard. Pulmonary/Chest: Effort normal and breath sounds normal. She has no wheezes.  Abdominal: She exhibits no distension and no mass.  Musculoskeletal: She exhibits no edema.  Lymphadenopathy:    She has no cervical adenopathy.  Neurological: She is alert and oriented to person, place, and time.  Skin: Skin is warm and dry. No rash noted. She is not diaphoretic.  Upper back small mole, brown 2 shades.  Psychiatric: Memory, affect and judgment normal.    Lab Results  Component Value Date   TSH 0.59 06/28/2008   Lab Results  Component Value Date   WBC 8.4 03/23/2013   HGB 12.8 03/23/2013   HCT 39.0 03/23/2013   MCV 95.6 03/23/2013   PLT 251 03/23/2013   Lab Results  Component Value Date   CREATININE 0.70 03/23/2013   BUN 14 03/23/2013   NA 139 03/23/2013   K 4.1 03/23/2013   CL 98 03/23/2013   CO2 39* 03/23/2013   Lab Results  Component Value Date   ALT 13 03/23/2013   AST 18 03/23/2013   ALKPHOS 55 03/23/2013   BILITOT 0.3 03/23/2013   Lab Results  Component Value Date   CHOL 173 03/23/2013   Lab Results  Component Value Date   HDL 56 03/23/2013   Lab Results  Component Value Date   LDLCALC 92 03/23/2013   Lab Results  Component Value Date   TRIG 123 03/23/2013   Lab Results  Component Value Date   CHOLHDL 3.1 03/23/2013     Assessment & Plan  COPD Doing well at present time. Patient will report worsening symptoms  Other and unspecified hyperlipidemia Well controlled, no changes  Elevated BP Well; controlled no changes  Sun-damaged skin Patient notes a lesion she has had on her back for years has been enlarging. Is referred to dermatology for further consideration.

## 2013-04-30 ENCOUNTER — Ambulatory Visit (INDEPENDENT_AMBULATORY_CARE_PROVIDER_SITE_OTHER): Payer: Medicare Other | Admitting: Physician Assistant

## 2013-04-30 ENCOUNTER — Encounter: Payer: Self-pay | Admitting: Physician Assistant

## 2013-04-30 VITALS — BP 130/88 | HR 88 | Temp 98.2°F | Resp 16 | Ht 59.0 in | Wt 139.8 lb

## 2013-04-30 DIAGNOSIS — J40 Bronchitis, not specified as acute or chronic: Secondary | ICD-10-CM

## 2013-04-30 MED ORDER — AZITHROMYCIN 250 MG PO TABS: ORAL_TABLET | ORAL | Status: DC

## 2013-04-30 NOTE — Patient Instructions (Signed)
Please increase fluid intake and try to get plenty of rest.  Daily claritin and saline nasal spray.  Continue COPD medications.  Try to place a humidifier in the bedroom.  Warm liquids to soothe throat.  Please call if symptoms not improved by Monday.

## 2013-05-02 DIAGNOSIS — J40 Bronchitis, not specified as acute or chronic: Secondary | ICD-10-CM | POA: Insufficient documentation

## 2013-05-02 NOTE — Assessment & Plan Note (Signed)
Rest. Fluids.  Saline nasal spray.  Daily probiotic. Albuterol as needed.  Most likely viral but given patients history and concerns, printed Rx Azithromycin given to patient if symptoms are not improving over the weekend.

## 2013-05-02 NOTE — Progress Notes (Signed)
Patient ID: Tanya Rush, female   DOB: April 22, 1946, 67 y.o.   MRN: [id number]  Patient presents to clinic today c/o 3 days of nasal congestion, cough productive of "whitish-green" sputum, sore throat, increase need for home O2 from 3L to 4L.  Patient denies fever, chills, sweats.  Patient endorses taking COPD medications as prescribed.  Is concerned because of her COPD.  Is afraid she will "end up in the hospital like last time".  Denies excessive wheeze.  Denies major history of allergies.    Past Medical History  Diagnosis Date  . COPD (chronic obstructive pulmonary disease)     severe; O2 dependent-- followed by pulm at [facility]  . Asthma   . Genital herpes   . History of ovarian cyst   . Elevated BP 09/28/2012  . Scabies 01/16/2013  . Sun-damaged skin 04/04/2013    Current Outpatient Prescriptions on File Prior to Visit  Medication Sig Dispense Refill  . albuterol (PROAIR HFA) 108 (90 BASE) MCG/ACT inhaler Inhale 2 puffs into the lungs daily as needed.        Marland Kitchen atorvastatin (LIPITOR) 20 MG tablet TAKE 1 TABLET (20 MG TOTAL) BY MOUTH DAILY.  90 tablet  0  . Fluticasone-Salmeterol (ADVAIR DISKUS) 250-50 MCG/DOSE AEPB Inhale 1 puff into the lungs every 12 (twelve) hours.       . hydrochlorothiazide (HYDRODIURIL) 12.5 MG tablet Take 1 tablet (12.5 mg total) by mouth daily.  90 tablet  1  . Multiple Vitamin (MULTIVITAMIN) tablet Take 1 tablet by mouth daily.      Marland Kitchen oxybutynin (OXYTROL) 3.9 MG/24HR Place 1 patch onto the skin as directed. Change patch every 4 days      . tiotropium (SPIRIVA) 18 MCG inhalation capsule Place 18 mcg into inhaler and inhale daily.        . valACYclovir (VALTREX) 1000 MG tablet TAKE 1 TABLET BY MOUTH DAILY.  90 tablet  1   No current facility-administered medications on file prior to visit.    Allergies  Allergen Reactions  . Aspirin     REACTION: sever chest pain    Family History  Problem Relation Age of Onset  . Heart disease Mother   . Stroke Mother    . Arthritis Father     rheumatoid  . Lymphoma Father   . Alcohol abuse Brother   . Cancer Neg Hx   . Alcohol abuse Son   . Alcohol abuse Son     recovering     History   Social History  . Marital Status: Married    Spouse Name: N/A    Number of Children: 3  . Years of Education: N/A   Social History Main Topics  . Smoking status: Former Smoker -- 45 years    Quit date: 08/05/2006  . Smokeless tobacco: Never Used  . Alcohol Use: No  . Drug Use: No  . Sexual Activity: None   Other Topics Concern  . None   Social History Narrative   3 children: 2 adopted, 1 biological         ROS See HPI.  All other ROS negative.   Filed Vitals:   04/30/13 1517  BP: 130/88  Pulse: 88  Temp: 98.2 F (36.8 C)  Resp: 16   Physical Exam  Vitals reviewed. Constitutional: She is oriented to person, place, and time and well-developed, well-nourished, and in no distress.  HENT:  Head: Normocephalic and atraumatic.  Right Ear: External ear normal.  Left Ear: External  ear normal.  Nose: Nose normal.  Mouth/Throat: Oropharynx is clear and moist. No oropharyngeal exudate.  TM WNL bilaterally  Eyes: Conjunctivae are normal. Pupils are equal, round, and reactive to light.  Neck: Normal range of motion. Neck supple.  Cardiovascular: Normal rate, regular rhythm and normal heart sounds.   Pulmonary/Chest: Effort normal and breath sounds normal. No respiratory distress. She has no wheezes. She has no rales. She exhibits no tenderness.  Lymphadenopathy:    She has no cervical adenopathy.  Neurological: She is alert and oriented to person, place, and time.  Skin: Skin is warm and dry. No rash noted.   Recent Results (from the past 2160 hour(s))  RENAL FUNCTION PANEL     Status: Abnormal   Collection Time    02/16/13  4:23 PM      Result Value Range   Sodium 140  135 - 145 mEq/L   Potassium 4.1  3.5 - 5.3 mEq/L   Chloride 95 (*) 96 - 112 mEq/L   CO2 39 (*) 19 - 32 mEq/L   Glucose,  Bld 103 (*) 70 - 99 mg/dL   BUN 17  6 - 23 mg/dL   Creat 8.46  9.62 - 9.52 mg/dL   Albumin 4.2  3.5 - 5.2 g/dL   Calcium 9.4  8.4 - 84.1 mg/dL   Phosphorus 4.1  2.3 - 4.6 mg/dL  RENAL FUNCTION PANEL     Status: Abnormal   Collection Time    02/23/13 11:49 AM      Result Value Range   Sodium 137  135 - 145 mEq/L   Comment:       Amended report.   Potassium 4.1  3.5 - 5.3 mEq/L   Comment:       Amended report.   Chloride 97  96 - 112 mEq/L   Comment:       Amended report.   CO2 32  19 - 32 mEq/L   Comment:       Amended report.   Glucose, Bld 146 (*) 70 - 99 mg/dL   Comment:       Amended report.   BUN 15  6 - 23 mg/dL   Comment:       Amended report.   Creat 0.70  0.50 - 1.10 mg/dL   Comment:       Amended report.   Albumin 4.2  3.5 - 5.2 g/dL   Comment:       Amended report.   Calcium 9.3  8.4 - 10.5 mg/dL   Comment:       Amended report.   Phosphorus 4.1  2.3 - 4.6 mg/dL   Comment:       Amended report.  BASIC METABOLIC PANEL     Status: Abnormal   Collection Time    03/23/13  5:05 PM      Result Value Range   Sodium 139  135 - 145 mEq/L   Potassium 4.1  3.5 - 5.3 mEq/L   Chloride 98  96 - 112 mEq/L   CO2 39 (*) 19 - 32 mEq/L   Glucose, Bld 97  70 - 99 mg/dL   BUN 14  6 - 23 mg/dL   Creat 3.24  4.01 - 0.27 mg/dL   Calcium 9.2  8.4 - 25.3 mg/dL  CBC WITH DIFFERENTIAL     Status: None   Collection Time    03/23/13  5:05 PM      Result Value Range   WBC  8.4  4.0 - 10.5 K/uL   RBC 4.08  3.87 - 5.11 MIL/uL   Hemoglobin 12.8  12.0 - 15.0 g/dL   HCT 91.4  78.2 - 95.6 %   MCV 95.6  78.0 - 100.0 fL   MCH 31.4  26.0 - 34.0 pg   MCHC 32.8  30.0 - 36.0 g/dL   RDW 21.3  08.6 - 57.8 %   Platelets 251  150 - 400 K/uL   Neutrophils Relative % 52  43 - 77 %   Neutro Abs 4.4  1.7 - 7.7 K/uL   Lymphocytes Relative 35  12 - 46 %   Lymphs Abs 2.9  0.7 - 4.0 K/uL   Monocytes Relative 8  3 - 12 %   Monocytes Absolute 0.7  0.1 - 1.0 K/uL   Eosinophils Relative 5  0 - 5  %   Eosinophils Absolute 0.4  0.0 - 0.7 K/uL   Basophils Relative 0  0 - 1 %   Basophils Absolute 0.0  0.0 - 0.1 K/uL   Smear Review Criteria for review not met    LIPID PANEL     Status: None   Collection Time    03/23/13  5:05 PM      Result Value Range   Cholesterol 173  0 - 200 mg/dL   Comment: ATP III Classification:           < 200        mg/dL        Desirable          200 - 239     mg/dL        Borderline High          >= 240        mg/dL        High         Triglycerides 123  <150 mg/dL   HDL 56  >46 mg/dL   Total CHOL/HDL Ratio 3.1     VLDL 25  0 - 40 mg/dL   LDL Cholesterol 92  0 - 99 mg/dL   Comment:       Total Cholesterol/HDL Ratio:CHD Risk                            Coronary Heart Disease Risk Table                                            Men       Women              1/2 Average Risk              3.4        3.3                  Average Risk              5.0        4.4               2X Average Risk              9.6        7.1               3X Average Risk  23.4       11.0     Use the calculated Patient Ratio above and the CHD Risk table      to determine the patient's CHD Risk.     ATP III Classification (LDL):           < 100        mg/dL         Optimal          100 - 129     mg/dL         Near or Above Optimal          130 - 159     mg/dL         Borderline High          160 - 189     mg/dL         High           > 190        mg/dL         Very High        HEPATIC FUNCTION PANEL     Status: None   Collection Time    03/23/13  5:05 PM      Result Value Range   Total Bilirubin 0.3  0.3 - 1.2 mg/dL   Bilirubin, Direct <1.6  0.0 - 0.3 mg/dL   Indirect Bilirubin NOT CALC  0.0 - 0.9 mg/dL   Alkaline Phosphatase 55  39 - 117 U/L   AST 18  0 - 37 U/L   ALT 13  0 - 35 U/L   Total Protein 6.9  6.0 - 8.3 g/dL   Albumin 4.3  3.5 - 5.2 g/dL    Assessment/Plan: Bronchitis Rest. Fluids.  Saline nasal spray.  Daily probiotic. Albuterol as needed.   Most likely viral but given patients history and concerns, printed Rx Azithromycin given to patient if symptoms are not improving over the weekend.

## 2013-05-19 ENCOUNTER — Other Ambulatory Visit: Payer: Self-pay | Admitting: Family Medicine

## 2013-05-19 NOTE — Telephone Encounter (Signed)
Rx request to pharmacy/SLS  

## 2013-05-30 ENCOUNTER — Other Ambulatory Visit: Payer: Self-pay | Admitting: Family Medicine

## 2013-06-10 ENCOUNTER — Other Ambulatory Visit: Payer: Self-pay

## 2013-08-22 ENCOUNTER — Other Ambulatory Visit: Payer: Self-pay | Admitting: Family Medicine

## 2013-08-23 ENCOUNTER — Telehealth: Payer: Self-pay | Admitting: Family Medicine

## 2013-08-23 DIAGNOSIS — I10 Essential (primary) hypertension: Secondary | ICD-10-CM

## 2013-08-23 MED ORDER — HYDROCHLOROTHIAZIDE 12.5 MG PO TABS: 12.5000 mg | ORAL_TABLET | Freq: Every day | ORAL | Status: DC

## 2013-08-23 MED ORDER — ATORVASTATIN CALCIUM 20 MG PO TABS: 20.0000 mg | ORAL_TABLET | Freq: Every day | ORAL | Status: DC

## 2013-08-23 NOTE — Telephone Encounter (Signed)
Please advise refill? 

## 2013-08-23 NOTE — Telephone Encounter (Signed)
Refill- atorvastatin 20mg  tablet. Take one tablet by mouth daily. Qty 90 last fill 10.27.14  Refill- hydrochlorothiazide 12.5mg  cap. Take one capsule every day. Qty 90 last fill 10.29.14

## 2013-09-30 ENCOUNTER — Ambulatory Visit: Payer: Medicare Other | Admitting: Family Medicine

## 2013-10-21 ENCOUNTER — Ambulatory Visit (INDEPENDENT_AMBULATORY_CARE_PROVIDER_SITE_OTHER): Payer: Medicare Other | Admitting: Family Medicine

## 2013-10-21 ENCOUNTER — Encounter: Payer: Self-pay | Admitting: Family Medicine

## 2013-10-21 VITALS — BP 138/84 | HR 84 | Temp 98.4°F | Ht 59.0 in | Wt 138.1 lb

## 2013-10-21 DIAGNOSIS — IMO0001 Reserved for inherently not codable concepts without codable children: Secondary | ICD-10-CM

## 2013-10-21 DIAGNOSIS — J4489 Other specified chronic obstructive pulmonary disease: Secondary | ICD-10-CM

## 2013-10-21 DIAGNOSIS — E785 Hyperlipidemia, unspecified: Secondary | ICD-10-CM

## 2013-10-21 DIAGNOSIS — R030 Elevated blood-pressure reading, without diagnosis of hypertension: Secondary | ICD-10-CM

## 2013-10-21 DIAGNOSIS — J449 Chronic obstructive pulmonary disease, unspecified: Secondary | ICD-10-CM

## 2013-10-21 NOTE — Patient Instructions (Signed)
Encouraged increased hydration and fiber in diet. Daily probiotics. If bowels not moving can use MOM 2 tbls po in 4 oz of warm prune juice by mouth every 2-3 days. If no results then repeat in 4 hours with  Dulcolax suppository pr, may repeat again in 4 more hours as needed. Seek care if symptoms worsen. Consider daily Miralax and/or Dulcolax if symptoms persist.   Constipation, Adult Constipation is when a person has fewer than 3 bowel movements a week; has difficulty having a bowel movement; or has stools that are dry, hard, or larger than normal. As people grow older, constipation is more common. If you try to fix constipation with medicines that make you have a bowel movement (laxatives), the problem may get worse. Long-term laxative use may cause the muscles of the colon to become weak. A low-fiber diet, not taking in enough fluids, and taking certain medicines may make constipation worse. CAUSES   Certain medicines, such as antidepressants, pain medicine, iron supplements, antacids, and water pills.   Certain diseases, such as diabetes, irritable bowel syndrome (IBS), thyroid disease, or depression.   Not drinking enough water.   Not eating enough fiber-rich foods.   Stress or travel.  Lack of physical activity or exercise.  Not going to the restroom when there is the urge to have a bowel movement.  Ignoring the urge to have a bowel movement.  Using laxatives too much. SYMPTOMS   Having fewer than 3 bowel movements a week.   Straining to have a bowel movement.   Having hard, dry, or larger than normal stools.   Feeling full or bloated.   Pain in the lower abdomen.  Not feeling relief after having a bowel movement. DIAGNOSIS  Your caregiver will take a medical history and perform a physical exam. Further testing may be done for severe constipation. Some tests may include:   A barium enema X-ray to examine your rectum, colon, and sometimes, your small  intestine.  A sigmoidoscopy to examine your lower colon.  A colonoscopy to examine your entire colon. TREATMENT  Treatment will depend on the severity of your constipation and what is causing it. Some dietary treatments include drinking more fluids and eating more fiber-rich foods. Lifestyle treatments may include regular exercise. If these diet and lifestyle recommendations do not help, your caregiver may recommend taking over-the-counter laxative medicines to help you have bowel movements. Prescription medicines may be prescribed if over-the-counter medicines do not work.  HOME CARE INSTRUCTIONS   Increase dietary fiber in your diet, such as fruits, vegetables, whole grains, and beans. Limit high-fat and processed sugars in your diet, such as [place] fries, hamburgers, cookies, candies, and soda.   A fiber supplement may be added to your diet if you cannot get enough fiber from foods.   Drink enough fluids to keep your urine clear or pale yellow.   Exercise regularly or as directed by your caregiver.   Go to the restroom when you have the urge to go. Do not hold it.  Only take medicines as directed by your caregiver. Do not take other medicines for constipation without talking to your caregiver first. Joseph IF:   You have bright red blood in your stool.   Your constipation lasts for more than 4 days or gets worse.   You have abdominal or rectal pain.   You have thin, pencil-like stools.  You have unexplained weight loss. MAKE SURE YOU:   Understand these instructions.  Will watch  your condition.  Will get help right away if you are not doing well or get worse. Document Released: 04/19/2004 Document Revised: 10/14/2011 Document Reviewed: 05/03/2013 [facility] Patient Information 2014 [place], [place].

## 2013-10-21 NOTE — Progress Notes (Signed)
Pre visit review using our clinic review tool, if applicable. No additional management support is needed unless otherwise documented below in the visit note. 

## 2013-10-22 ENCOUNTER — Telehealth: Payer: Self-pay | Admitting: Family Medicine

## 2013-10-22 NOTE — Telephone Encounter (Signed)
Relevant patient education assigned to patient using Emmi. ° °

## 2013-10-24 ENCOUNTER — Encounter: Payer: Self-pay | Admitting: Family Medicine

## 2013-10-24 NOTE — Assessment & Plan Note (Signed)
Well controlled, no changes to meds. Encouraged heart healthy diet such as the DASH diet and exercise as tolerated.  °

## 2013-10-24 NOTE — Assessment & Plan Note (Signed)
Encouraged heart healthy diet, increase exercise, avoid trans fats, consider a krill oil cap daily 

## 2013-10-24 NOTE — Progress Notes (Signed)
Patient ID: Tanya Rush, female   DOB: 1946-07-26, 68 y.o.   MRN: [id number] Tanya Rush [id number] 03-Nov-1945 10/24/2013      Progress Note-Follow Up  Subjective  Chief Complaint  Chief Complaint  Patient presents with  . Follow-up    6 month    HPI  Patient is a 68 year old female in today for routine medical care. Patient is in today for routine followup in doing fairly well. Continues to follow with pulmonology routinely. Is on nasal cannula 24 7. No recent illness. Denies CP/palp/SOB/HA/congestion/fevers/GI or GU c/o. Taking meds as prescribed  Past Medical History  Diagnosis Date  . COPD (chronic obstructive pulmonary disease)     severe; O2 dependent-- followed [facility] pulm at [facility]  . Asthma   . Genital herpes   . History of ovarian cyst   . Elevated BP 09/28/2012  . Scabies 01/16/2013  . Sun-damaged skin 04/04/2013    History reviewed. No pertinent past surgical history.  Family History  Problem Relation Age of Onset  . Heart disease Mother   . Stroke Mother   . Arthritis Father     rheumatoid  . Lymphoma Father   . Alcohol abuse Brother   . Cancer Neg Hx   . Alcohol abuse Son   . Alcohol abuse Son     recovering     History   Social History  . Marital Status: Married    Spouse Name: N/A    Number of Children: 3  . Years of Education: N/A   Occupational History  . Not on file.   Social History Main Topics  . Smoking status: Former Smoker -- 29 years    Quit date: 08/05/2006  . Smokeless tobacco: Never Used  . Alcohol Use: No  . Drug Use: No  . Sexual Activity: Not on file   Other Topics Concern  . Not on file   Social History Narrative   3 children: 2 adopted, 1 biological          Current Outpatient Prescriptions on File Prior to Visit  Medication Sig Dispense Refill  . albuterol (PROAIR HFA) 108 (90 BASE) MCG/ACT inhaler Inhale 2 puffs into the lungs daily as needed.        Marland Kitchen atorvastatin (LIPITOR) 20 MG tablet Take 1 tablet (20 mg  total) by mouth daily at 6 PM.  90 tablet  0  . Fluticasone-Salmeterol (ADVAIR DISKUS) 250-50 MCG/DOSE AEPB Inhale 1 puff into the lungs every 12 (twelve) hours.       . hydrochlorothiazide (HYDRODIURIL) 12.5 MG tablet Take 1 tablet (12.5 mg total) by mouth daily.  90 tablet  0  . Multiple Vitamin (MULTIVITAMIN) tablet Take 1 tablet by mouth daily.      Marland Kitchen oxybutynin (OXYTROL) 3.9 MG/24HR Place 1 patch onto the skin as directed. Change patch every 4 days      . tiotropium (SPIRIVA) 18 MCG inhalation capsule Place 18 mcg into inhaler and inhale daily.        . valACYclovir (VALTREX) 1000 MG tablet TAKE 1 TABLET EVERY DAY  90 tablet  0   No current facility-administered medications on file prior to visit.    Allergies  Allergen Reactions  . Aspirin     REACTION: sever chest pain    Review of Systems  Review of Systems  Constitutional: Positive for malaise/fatigue. Negative for fever.  HENT: Negative for congestion.   Eyes: Negative for discharge.  Respiratory: Positive for shortness of breath. Negative  for sputum production.   Cardiovascular: Negative for chest pain, palpitations and leg swelling.  Gastrointestinal: Negative for nausea, abdominal pain and diarrhea.  Genitourinary: Negative for dysuria.  Musculoskeletal: Negative for falls.  Skin: Negative for rash.  Neurological: Negative for loss of consciousness and headaches.  Endo/Heme/Allergies: Negative for polydipsia.  Psychiatric/Behavioral: Negative for depression and suicidal ideas. The patient is not nervous/anxious and does not have insomnia.     Objective  BP 138/84  Pulse 84  Temp(Src) 98.4 F (36.9 C) (Oral)  Ht 4\' 11"  (1.499 m)  Wt 138 lb 1.3 oz (62.633 kg)  BMI 27.87 kg/m2  SpO2 95%  Physical Exam  Physical Exam  Constitutional: She is oriented to person, place, and time and well-developed, well-nourished, and in no distress. No distress.  HENT:  Head: Normocephalic and atraumatic.  Eyes: Conjunctivae  are normal.  Neck: Neck supple. No thyromegaly present.  Cardiovascular: Normal rate, regular rhythm and normal heart sounds.   No murmur heard. Pulmonary/Chest: Effort normal and breath sounds normal. She has no wheezes.  O2 via [place]  Abdominal: She exhibits no distension and no mass.  Musculoskeletal: She exhibits no edema.  Lymphadenopathy:    She has no cervical adenopathy.  Neurological: She is alert and oriented to person, place, and time.  Skin: Skin is warm and dry. No rash noted. She is not diaphoretic.  Psychiatric: Memory, affect and judgment normal.    Lab Results  Component Value Date   TSH 0.59 06/28/2008   Lab Results  Component Value Date   WBC 8.4 03/23/2013   HGB 12.8 03/23/2013   HCT 39.0 03/23/2013   MCV 95.6 03/23/2013   PLT 251 03/23/2013   Lab Results  Component Value Date   CREATININE 0.70 03/23/2013   BUN 14 03/23/2013   NA 139 03/23/2013   K 4.1 03/23/2013   CL 98 03/23/2013   CO2 39* 03/23/2013   Lab Results  Component Value Date   ALT 13 03/23/2013   AST 18 03/23/2013   ALKPHOS 55 03/23/2013   BILITOT 0.3 03/23/2013   Lab Results  Component Value Date   CHOL 173 03/23/2013   Lab Results  Component Value Date   HDL 56 03/23/2013   Lab Results  Component Value Date   LDLCALC 92 03/23/2013   Lab Results  Component Value Date   TRIG 123 03/23/2013   Lab Results  Component Value Date   CHOLHDL 3.1 03/23/2013     Assessment & Plan  Elevated BP Well controlled, no changes to meds. Encouraged heart healthy diet such as the DASH diet and exercise as tolerated.   COPD Stable on current meds and O2 via [place]  Other and unspecified hyperlipidemia Encouraged heart healthy diet, increase exercise, avoid trans fats, consider a krill oil cap daily

## 2013-10-24 NOTE — Assessment & Plan Note (Signed)
Stable on current meds and O2 via [place]

## 2013-10-26 LAB — LIPID PANEL
CHOLESTEROL: 166 mg/dL (ref 0–200)
HDL: 60 mg/dL (ref >39)
LDL Cholesterol: 82 mg/dL (ref 0–99)
TRIGLYCERIDES: 119 mg/dL (ref <150)
Total CHOL/HDL Ratio: 2.8 Ratio
VLDL: 24 mg/dL (ref 0–40)

## 2013-10-26 LAB — HEPATIC FUNCTION PANEL
ALT: 18 U/L (ref 0–35)
AST: 21 U/L (ref 0–37)
Albumin: 4.4 g/dL (ref 3.5–5.2)
Alkaline Phosphatase: 55 U/L (ref 39–117)
BILIRUBIN DIRECT: 0.1 mg/dL (ref 0.0–0.3)
Indirect Bilirubin: 0.2 mg/dL (ref 0.2–1.2)
Total Bilirubin: 0.3 mg/dL (ref 0.2–1.2)
Total Protein: 6.8 g/dL (ref 6.0–8.3)

## 2013-10-26 LAB — CBC
HCT: 37.3 % (ref 36.0–46.0)
Hemoglobin: 12.3 g/dL (ref 12.0–15.0)
MCH: 32 pg (ref 26.0–34.0)
MCHC: 33 g/dL (ref 30.0–36.0)
MCV: 97.1 fL (ref 78.0–100.0)
PLATELETS: 277 10*3/uL (ref 150–400)
RBC: 3.84 MIL/uL — ABNORMAL LOW (ref 3.87–5.11)
RDW: 14.4 % (ref 11.5–15.5)
WBC: 8.7 10*3/uL (ref 4.0–10.5)

## 2013-10-26 LAB — RENAL FUNCTION PANEL
ALBUMIN: 4.4 g/dL (ref 3.5–5.2)
BUN: 13 mg/dL (ref 6–23)
CALCIUM: 9.8 mg/dL (ref 8.4–10.5)
CO2: 39 mEq/L — ABNORMAL HIGH (ref 19–32)
Chloride: 93 mEq/L — ABNORMAL LOW (ref 96–112)
Creat: 0.66 mg/dL (ref 0.50–1.10)
Glucose, Bld: 122 mg/dL — ABNORMAL HIGH (ref 70–99)
Phosphorus: 4.1 mg/dL (ref 2.3–4.6)
Potassium: 4.5 mEq/L (ref 3.5–5.3)
SODIUM: 137 meq/L (ref 135–145)

## 2013-10-26 LAB — TSH: TSH: 0.414 u[IU]/mL (ref 0.350–4.500)

## 2013-11-20 ENCOUNTER — Other Ambulatory Visit: Payer: Self-pay | Admitting: Physician Assistant

## 2013-11-22 NOTE — Telephone Encounter (Signed)
Rx request to pharmacy/SLS  

## 2013-11-23 ENCOUNTER — Telehealth: Payer: Self-pay | Admitting: Family Medicine

## 2013-11-23 DIAGNOSIS — I10 Essential (primary) hypertension: Secondary | ICD-10-CM

## 2013-11-23 MED ORDER — ATORVASTATIN CALCIUM 20 MG PO TABS: 20.0000 mg | ORAL_TABLET | Freq: Every day | ORAL | Status: DC

## 2013-11-23 MED ORDER — HYDROCHLOROTHIAZIDE 12.5 MG PO TABS: 12.5000 mg | ORAL_TABLET | Freq: Every day | ORAL | Status: DC

## 2013-11-23 NOTE — Telephone Encounter (Signed)
Refill- atorvastatin  Refill- hydrochlorothiazide

## 2013-11-23 NOTE — Telephone Encounter (Signed)
Rx's sent to pharmacy.  

## 2014-03-03 ENCOUNTER — Other Ambulatory Visit: Payer: Self-pay | Admitting: Family Medicine

## 2014-03-22 ENCOUNTER — Ambulatory Visit: Payer: Medicare Other | Admitting: Internal Medicine

## 2014-05-02 ENCOUNTER — Encounter: Payer: Self-pay | Admitting: Family Medicine

## 2014-05-02 ENCOUNTER — Ambulatory Visit (INDEPENDENT_AMBULATORY_CARE_PROVIDER_SITE_OTHER): Payer: Medicare Other | Admitting: Family Medicine

## 2014-05-02 VITALS — BP 148/94 | HR 78 | Temp 98.1°F | Ht 59.0 in | Wt 135.0 lb

## 2014-05-02 DIAGNOSIS — IMO0001 Reserved for inherently not codable concepts without codable children: Secondary | ICD-10-CM

## 2014-05-02 DIAGNOSIS — J449 Chronic obstructive pulmonary disease, unspecified: Secondary | ICD-10-CM

## 2014-05-02 DIAGNOSIS — N3281 Overactive bladder: Secondary | ICD-10-CM

## 2014-05-02 DIAGNOSIS — R030 Elevated blood-pressure reading, without diagnosis of hypertension: Secondary | ICD-10-CM

## 2014-05-02 DIAGNOSIS — R7309 Other abnormal glucose: Secondary | ICD-10-CM

## 2014-05-02 DIAGNOSIS — Z23 Encounter for immunization: Secondary | ICD-10-CM

## 2014-05-02 DIAGNOSIS — E785 Hyperlipidemia, unspecified: Secondary | ICD-10-CM

## 2014-05-02 DIAGNOSIS — G4700 Insomnia, unspecified: Secondary | ICD-10-CM

## 2014-05-02 DIAGNOSIS — R739 Hyperglycemia, unspecified: Secondary | ICD-10-CM

## 2014-05-02 DIAGNOSIS — Z0000 Encounter for general adult medical examination without abnormal findings: Secondary | ICD-10-CM

## 2014-05-02 LAB — COMPREHENSIVE METABOLIC PANEL
ALT: 15 U/L (ref 0–35)
AST: 24 U/L (ref 0–37)
Albumin: 4.3 g/dL (ref 3.5–5.2)
Alkaline Phosphatase: 52 U/L (ref 39–117)
BILIRUBIN TOTAL: 0.5 mg/dL (ref 0.2–1.2)
BUN: 12 mg/dL (ref 6–23)
CHLORIDE: 92 meq/L — AB (ref 96–112)
CO2: 38 mEq/L — ABNORMAL HIGH (ref 19–32)
Calcium: 9.1 mg/dL (ref 8.4–10.5)
Creatinine, Ser: 0.6 mg/dL (ref 0.4–1.2)
GFR: 100.15 mL/min (ref >60.00)
GLUCOSE: 100 mg/dL — AB (ref 70–99)
Potassium: 3.6 mEq/L (ref 3.5–5.1)
SODIUM: 134 meq/L — AB (ref 135–145)
Total Protein: 7.5 g/dL (ref 6.0–8.3)

## 2014-05-02 LAB — LIPID PANEL
CHOLESTEROL: 179 mg/dL (ref 0–200)
HDL: 59.3 mg/dL (ref >39.00)
LDL Cholesterol: 86 mg/dL (ref 0–99)
NONHDL: 119.7
Total CHOL/HDL Ratio: 3
Triglycerides: 171 mg/dL — ABNORMAL HIGH (ref 0.0–149.0)
VLDL: 34.2 mg/dL (ref 0.0–40.0)

## 2014-05-02 LAB — HEMOGLOBIN A1C: Hgb A1c MFr Bld: 6 % (ref 4.6–6.5)

## 2014-05-02 MED ORDER — PNEUMOCOCCAL 13-VAL CONJ VACC IM SUSP: 0.5000 mL | Freq: Once | INTRAMUSCULAR | Status: DC

## 2014-05-02 NOTE — Progress Notes (Signed)
Pre visit review using our clinic review tool, if applicable. No additional management support is needed unless otherwise documented below in the visit note. 

## 2014-05-02 NOTE — Patient Instructions (Signed)

## 2014-05-04 ENCOUNTER — Encounter: Payer: Self-pay | Admitting: Family Medicine

## 2014-05-04 DIAGNOSIS — Z0000 Encounter for general adult medical examination without abnormal findings: Secondary | ICD-10-CM | POA: Insufficient documentation

## 2014-05-04 DIAGNOSIS — R739 Hyperglycemia, unspecified: Secondary | ICD-10-CM | POA: Insufficient documentation

## 2014-05-04 DIAGNOSIS — N3281 Overactive bladder: Secondary | ICD-10-CM | POA: Insufficient documentation

## 2014-05-04 DIAGNOSIS — G4700 Insomnia, unspecified: Secondary | ICD-10-CM | POA: Insufficient documentation

## 2014-05-04 HISTORY — DX: Overactive bladder: N32.81

## 2014-05-04 HISTORY — DX: Encounter for general adult medical examination without abnormal findings: Z00.00

## 2014-05-04 NOTE — Progress Notes (Signed)
Patient ID: Tanya Rush, female   DOB: 1945-08-07, 68 y.o.   MRN: [id number] Tanya Rush [id number] 1946/04/22 05/04/2014      Progress Note-Follow Up  Subjective  Chief Complaint  Chief Complaint  Patient presents with  . Annual Exam    medicare wellness  . Injections    flu and prevnar    HPI  Patient is a 68 year old female in today for routine medical care. Patient is doing relatively well. Accompanied by her husband today. Slept poorly last night and acknowledges this is a precursor. Falls asleep after half an hour to an hour but has trouble staying asleep or falling back to sleep. Oxytrol patch has helped her urinary frequency some but not completely. She did have a recent fall but has recovered. She notes since starting aquatic she's had no further yeast infection. No other recent illness or acute concerns. Denies CP/palp/SOB/HA/congestion/fevers/GI or GU c/o. Taking meds as prescribed  Past Medical History  Diagnosis Date  . COPD (chronic obstructive pulmonary disease)     severe; O2 dependent-- followed by pulm at [facility]  . Asthma   . Genital herpes   . History of ovarian cyst   . Elevated BP 09/28/2012  . Scabies 01/16/2013  . Sun-damaged skin 04/04/2013  . Medicare annual wellness visit, subsequent 05/04/2014    History reviewed. No pertinent past surgical history.  Family History  Problem Relation Age of Onset  . Heart disease Mother   . Stroke Mother   . Arthritis Father     rheumatoid  . Lymphoma Father   . COPD Father     smoker  . Alcohol abuse Brother     TBI  . Cancer Neg Hx   . Alcohol abuse Son   . Alcohol abuse Son     recovering x 3 years  . Reye's syndrome Brother     History   Social History  . Marital Status: Married    Spouse Name: N/A    Number of Children: 3  . Years of Education: N/A   Occupational History  . Not on file.   Social History Main Topics  . Smoking status: Former Smoker -- 82 years    Quit date: 08/05/2006   . Smokeless tobacco: Never Used  . Alcohol Use: No  . Drug Use: No  . Sexual Activity: No     Comment: lives with husband, no dietary restrictions   Other Topics Concern  . Not on file   Social History Narrative   3 children: 2 adopted, 1 biological          Current Outpatient Prescriptions on File Prior to Visit  Medication Sig Dispense Refill  . albuterol (PROAIR HFA) 108 (90 BASE) MCG/ACT inhaler Inhale 2 puffs into the lungs daily as needed.        Marland Kitchen atorvastatin (LIPITOR) 20 MG tablet TAKE 1 TABLET AT 6PM  90 tablet  0  . Fluticasone-Salmeterol (ADVAIR DISKUS) 250-50 MCG/DOSE AEPB Inhale 1 puff into the lungs every 12 (twelve) hours.       . hydrochlorothiazide (MICROZIDE) 12.5 MG capsule TAKE ONE CAPSULE EVERY DAY  90 capsule  0  . Multiple Vitamin (MULTIVITAMIN) tablet Take 1 tablet by mouth daily.      Marland Kitchen oxybutynin (OXYTROL) 3.9 MG/24HR Place 1 patch onto the skin as directed. Change patch every 4 days      . tiotropium (SPIRIVA) 18 MCG inhalation capsule Place 18 mcg into inhaler and inhale daily.        Marland Kitchen  valACYclovir (VALTREX) 1000 MG tablet TAKE 1 TABLET EVERY DAY  90 tablet  0   No current facility-administered medications on file prior to visit.    Allergies  Allergen Reactions  . Aspirin     REACTION: sever chest pain    Review of Systems  Review of Systems  Constitutional: Negative for fever and malaise/fatigue.  HENT: Negative for congestion.   Eyes: Negative for discharge.  Respiratory: Negative for shortness of breath.   Cardiovascular: Negative for chest pain, palpitations and leg swelling.  Gastrointestinal: Negative for nausea, abdominal pain and diarrhea.  Genitourinary: Positive for frequency. Negative for dysuria.  Musculoskeletal: Negative for falls.  Skin: Negative for rash.  Neurological: Negative for loss of consciousness and headaches.  Endo/Heme/Allergies: Negative for polydipsia.  Psychiatric/Behavioral: Negative for depression and  suicidal ideas. The patient has insomnia. The patient is not nervous/anxious.     Objective  BP 148/94  Pulse 78  Temp(Src) 98.1 F (36.7 C) (Oral)  Ht 4\' 11"  (1.499 m)  Wt 135 lb (61.236 kg)  BMI 27.25 kg/m2  SpO2 94%  Physical Exam  Physical Exam  Constitutional: She is oriented to person, place, and time and well-developed, well-nourished, and in no distress. No distress.  HENT:  Head: Normocephalic and atraumatic.  Right Ear: External ear normal.  Left Ear: External ear normal.  Nose: Nose normal.  Mouth/Throat: Oropharynx is clear and moist. No oropharyngeal exudate.  Eyes: Conjunctivae are normal. Pupils are equal, round, and reactive to light. Right eye exhibits no discharge. Left eye exhibits no discharge. No scleral icterus.  Neck: Normal range of motion. Neck supple. No thyromegaly present.  Cardiovascular: Normal rate, regular rhythm, normal heart sounds and intact distal pulses.   No murmur heard. Pulmonary/Chest: Effort normal and breath sounds normal. No respiratory distress. She has no wheezes. She has no rales.  Abdominal: Soft. Bowel sounds are normal. She exhibits no distension and no mass. There is no tenderness.  Musculoskeletal: Normal range of motion. She exhibits no edema and no tenderness.  Lymphadenopathy:    She has no cervical adenopathy.  Neurological: She is alert and oriented to person, place, and time. She has normal reflexes. No cranial nerve deficit. Coordination normal.  Skin: Skin is warm and dry. No rash noted. She is not diaphoretic.  Psychiatric: Mood, memory and affect normal.    Lab Results  Component Value Date   TSH 0.414 10/25/2013   Lab Results  Component Value Date   WBC 8.7 10/25/2013   HGB 12.3 10/25/2013   HCT 37.3 10/25/2013   MCV 97.1 10/25/2013   PLT 277 10/25/2013   Lab Results  Component Value Date   CREATININE 0.6 05/02/2014   BUN 12 05/02/2014   NA 134* 05/02/2014   K 3.6 05/02/2014   CL 92* 05/02/2014   CO2 38*  05/02/2014   Lab Results  Component Value Date   ALT 15 05/02/2014   AST 24 05/02/2014   ALKPHOS 52 05/02/2014   BILITOT 0.5 05/02/2014   Lab Results  Component Value Date   CHOL 179 05/02/2014   Lab Results  Component Value Date   HDL 59.30 05/02/2014   Lab Results  Component Value Date   LDLCALC 86 05/02/2014   Lab Results  Component Value Date   TRIG 171.0* 05/02/2014   Lab Results  Component Value Date   CHOLHDL 3 05/02/2014     Assessment & Plan  Other and unspecified hyperlipidemia Encouraged heart healthy diet, increase exercise, avoid trans  fats, consider a krill oil cap daily. Tolerating Lipitor  Elevated BP improvd on recheck but still hi, did not take HCTZ today, encouraged DASH diet, minimize caffeine and obtain adequate sleep. Report concerning symptoms and follow up as directed and as needed  COPD No recent flares, following with [facility], Dr Vickki Muff  Hyperglycemia hgba1c acceptable, minimize simple carbs. Increase exercise as tolerated.   Medicare annual wellness visit, subsequent Patient denies any difficulties at home. No trouble with ADLs, depression or falls. No recent changes to vision or hearing. Is UTD with immunizations. Is UTD with screening. Discussed Advanced Directives, patient agrees to bring [place] copies of documents if can. Encouraged heart healthy diet, exercise as tolerated and adequate sleep. Follows with Dr Vickki Muff, pulmonology at [facility] with opthamology but does not remember name, agrees to provide Declines colonoscopy, agrees to IFOB, given kit in office.  Declines referral to PT despite recent fall Declines PAP MGM 2013, encouraged to proceed with MGM but patient noncommitta Given Prevnar and flu shot today, labs reviewed. Return in 6 months or as needed  Insomnia Encouraged good sleep hygiene such as dark, quiet room. No blue/green glowing lights such as computer screens in bedroom. No alcohol or stimulants in evening. Cut  down on caffeine as able. Regular exercise is helpful but not just prior to bed time.   Overactive bladder OTC Oxytrol patches partially helpful.

## 2014-05-04 NOTE — Assessment & Plan Note (Signed)
improvd on recheck but still hi, did not take HCTZ today, encouraged DASH diet, minimize caffeine and obtain adequate sleep. Report concerning symptoms and follow up as directed and as needed

## 2014-05-04 NOTE — Assessment & Plan Note (Addendum)
Encouraged heart healthy diet, increase exercise, avoid trans fats, consider a krill oil cap daily. Tolerating Lipitor 

## 2014-05-04 NOTE — Assessment & Plan Note (Addendum)
Patient denies any difficulties at home. No trouble with ADLs, depression or falls. No recent changes to vision or hearing. Is UTD with immunizations. Is UTD with screening. Discussed Advanced Directives, patient agrees to bring us copies of documents if can. Encouraged heart healthy diet, exercise as tolerated and adequate sleep. Follows with Dr Fowler, pulmonology at [facility] Follows with opthamology but does not remember name, agrees to provide Declines colonoscopy, agrees to IFOB, given kit in office.  Declines referral to PT despite recent fall Declines PAP MGM 2013, encouraged to proceed with MGM but patient noncommitta Given Prevnar and flu shot today, labs reviewed. Return in 6 months or as needed 

## 2014-05-04 NOTE — Assessment & Plan Note (Signed)
hgba1c acceptable, minimize simple carbs. Increase exercise as tolerated.  

## 2014-05-04 NOTE — Assessment & Plan Note (Signed)
No recent flares, following with [facility], Dr Vickki Muff

## 2014-05-04 NOTE — Assessment & Plan Note (Signed)
Encouraged good sleep hygiene such as dark, quiet room. No blue/green glowing lights such as computer screens in bedroom. No alcohol or stimulants in evening. Cut down on caffeine as able. Regular exercise is helpful but not just prior to bed time.  

## 2014-05-04 NOTE — Assessment & Plan Note (Signed)
OTC Oxytrol patches partially helpful.

## 2014-06-01 ENCOUNTER — Other Ambulatory Visit: Payer: Self-pay | Admitting: Family Medicine

## 2014-06-28 ENCOUNTER — Other Ambulatory Visit: Payer: Medicare Other

## 2014-07-01 ENCOUNTER — Other Ambulatory Visit: Payer: Medicare Other

## 2014-07-07 ENCOUNTER — Telehealth: Payer: Self-pay

## 2014-07-07 ENCOUNTER — Other Ambulatory Visit (INDEPENDENT_AMBULATORY_CARE_PROVIDER_SITE_OTHER): Payer: Medicare Other

## 2014-07-07 DIAGNOSIS — Z1211 Encounter for screening for malignant neoplasm of colon: Secondary | ICD-10-CM

## 2014-07-07 LAB — FECAL OCCULT BLOOD, IMMUNOCHEMICAL: FECAL OCCULT BLD: NEGATIVE

## 2014-07-07 NOTE — Telephone Encounter (Signed)
Order placed

## 2014-07-07 NOTE — Telephone Encounter (Signed)
-----   Message from Tanya Rush sent at 07/07/2014  8:18 AM EST ----- [facility] lab needs IFOB order for this patient. Please make future.

## 2014-08-02 ENCOUNTER — Other Ambulatory Visit: Payer: Self-pay

## 2014-08-02 MED ORDER — VALACYCLOVIR HCL 1 G PO TABS: 1000.0000 mg | ORAL_TABLET | Freq: Every day | ORAL | Status: DC

## 2014-08-02 MED ORDER — HYDROCHLOROTHIAZIDE 12.5 MG PO CAPS: 12.5000 mg | ORAL_CAPSULE | Freq: Every day | ORAL | Status: DC

## 2014-08-02 MED ORDER — ATORVASTATIN CALCIUM 20 MG PO TABS: ORAL_TABLET | ORAL | Status: DC

## 2014-08-06 DIAGNOSIS — J449 Chronic obstructive pulmonary disease, unspecified: Secondary | ICD-10-CM | POA: Diagnosis not present

## 2014-09-06 DIAGNOSIS — J449 Chronic obstructive pulmonary disease, unspecified: Secondary | ICD-10-CM | POA: Diagnosis not present

## 2014-10-05 DIAGNOSIS — J449 Chronic obstructive pulmonary disease, unspecified: Secondary | ICD-10-CM | POA: Diagnosis not present

## 2014-10-31 ENCOUNTER — Ambulatory Visit: Payer: Medicare Other | Admitting: Family Medicine

## 2014-11-05 DIAGNOSIS — J449 Chronic obstructive pulmonary disease, unspecified: Secondary | ICD-10-CM | POA: Diagnosis not present

## 2014-11-07 ENCOUNTER — Encounter: Payer: Self-pay | Admitting: Family Medicine

## 2014-11-07 ENCOUNTER — Ambulatory Visit (INDEPENDENT_AMBULATORY_CARE_PROVIDER_SITE_OTHER): Payer: Medicare Other | Admitting: Family Medicine

## 2014-11-07 VITALS — BP 124/82 | HR 91 | Temp 97.9°F | Resp 20 | Ht 59.0 in | Wt 141.0 lb

## 2014-11-07 DIAGNOSIS — J449 Chronic obstructive pulmonary disease, unspecified: Secondary | ICD-10-CM

## 2014-11-07 DIAGNOSIS — R739 Hyperglycemia, unspecified: Secondary | ICD-10-CM | POA: Diagnosis not present

## 2014-11-07 DIAGNOSIS — E782 Mixed hyperlipidemia: Secondary | ICD-10-CM | POA: Diagnosis not present

## 2014-11-07 DIAGNOSIS — IMO0001 Reserved for inherently not codable concepts without codable children: Secondary | ICD-10-CM

## 2014-11-07 DIAGNOSIS — R030 Elevated blood-pressure reading, without diagnosis of hypertension: Secondary | ICD-10-CM | POA: Diagnosis not present

## 2014-11-07 DIAGNOSIS — R413 Other amnesia: Secondary | ICD-10-CM

## 2014-11-07 NOTE — Patient Instructions (Signed)
Alzheimer Disease Alzheimer disease is a mental disorder. It causes memory loss and loss of other mental functions, such as learning, thinking, problem solving, communicating, and completing tasks. The mental losses interfere with the ability to perform daily activities at work, at home, or in social situations. Alzheimer disease usually starts in a person's late 60s or early 70s but can start earlier in life (familial form). The mental changes caused by this disease are permanent and worsen over time. As the illness progresses, the ability to do even the simplest things is lost. Survival with Alzheimer disease ranges from several years to as long as 20 years. CAUSES Alzheimer disease is caused by abnormally high levels of a protein (beta-amyloid) in the brain. This protein forms very small deposits within and around the brain's nerve cells. These deposits prevent the nerve cells from working properly. Experts are not certain what causes the beta-amyloid deposits in this disease. RISK FACTORS The following major risk factors have been identified:  Increasing age.  Certain genetic variations, such as Down syndrome (trisomy 21). SYMPTOMS In the early stages of Alzheimer disease, you are still able to perform daily activities but need greater effort, more time, or memory aids. Early symptoms include:  Mild memory loss of recent events, names, or phone numbers.  Loss of objects.  Minor loss of vocabulary.  Difficulty with complex tasks, such as paying bills or driving in unfamiliar locations. Other mental functions deteriorate as the disease worsens. These changes slowly go from mild to severe. Symptoms at this stage include:  Difficulty remembering. You may not be able to recall personal information such as your address and telephone number. You may become confused about the date, the season of the year, or your location.  Difficulty maintaining attention. You may forget what you wanted to say  during conversations and repeat what you have already said.  Difficulty learning new information or tasks. You may not remember what you read or the name of a new friend you met.  Difficulty counting or doing math. You may have difficulty with complex math problems. You may make mistakes in paying bills or managing your checkbook.  Poor reasoning and judgment. You may make poor decisions or not dress right for the weather.  Difficulty communicating. You may have regular difficulty remembering words, naming objects, expressing yourself clearly, or writing sentences that make sense.  Difficulty performing familiar daily activities. You may get lost driving in familiar locations or need help eating, bathing, dressing, grooming, or using the toilet. You may have difficulty maintaining bladder or bowel control.  Difficulty recognizing familiar faces. You may confuse family members or close friends with one another. You may not recognize a close relative or may mistake strangers for family. Alzheimer disease also may cause changes in personality and behavior. These changes include:   Loss of interest or motivation.  Social withdrawal.  Anxiety.  Difficulty sleeping.  Uncharacteristic anger or combativeness.  A false belief that someone is trying to harm you (paranoia).  Seeing things that are not real (hallucinations).  Agitation. Confusion and disruptive behavior are often worse at night and may be triggered by changes in the environment or acute medical issues. DIAGNOSIS  Alzheimer disease is diagnosed through an assessment by your health care provider. During this assessment, your health care provider will do the following:  Ask you and your family, friends, or caregivers questions about your symptoms, their frequency, their duration and progression, and the effect they are having on your life.    Ask questions about your personal and family medical history and use of alcohol or drugs,  including prescription medicine.  Perform a physical exam and order blood tests and brain imaging exams. Your health care provider may refer you to a specialist for detailed evaluation of your mental functions (neuropsychological testing).  Many different brain disorders, medical conditions, and certain substances can cause symptoms that resemble Alzheimer disease symptoms. These must be ruled out before this disease can be diagnosed. If Alzheimer disease is diagnosed, it will be considered either "possible" or "probable" Alzheimer disease. "Possible" Alzheimer disease means that your symptoms are typical of the disease and no other disorder is causing them. "Probable" Alzheimer disease means that you also have a family history of the disease or genetic test results that support the diagnosis. Certain tests, mostly used in research studies, are highly specific for Alzheimer disease.  TREATMENT  There is currently no cure for this disease. The goals of treatment are to:  Slow down the progression of the disease.  Preserve mental function as long as possible.  Manage behavioral symptoms.  Make life easier for the person with Alzheimer disease and his or her caregivers. The following treatment options are available:  Medicine. Certain medicines may help slow memory loss by changing the level of certain chemicals in the brain. Medicine may also help with behavioral symptoms.  Talk therapy. Talk therapy provides education, support, and memory aids for people with this disease. It is most effective in the early stages of the illness.  Caregiving. Caregivers may be family members, friends, or trained medical professionals. They help the person with Alzheimer disease with daily life activities. Caregiving may take place at home or at a nursing facility.  Family support groups. These provide education, emotional support, and information about community resources to family members who are taking care of  the person with this disease. Document Released: 04/02/2004 Document Revised: 12/06/2013 Document Reviewed: 11/27/2012 [employer] Patient Information 2015 [employer]. This information is not intended to replace advice given to you by your health care provider. Make sure you discuss any questions you have with your health care provider.  

## 2014-11-08 LAB — LIPID PANEL
Cholesterol: 172 mg/dL (ref 0–200)
HDL: 58.4 mg/dL (ref >39.00)
LDL Cholesterol: 81 mg/dL (ref 0–99)
NonHDL: 113.6
TRIGLYCERIDES: 164 mg/dL — AB (ref 0.0–149.0)
Total CHOL/HDL Ratio: 3
VLDL: 32.8 mg/dL (ref 0.0–40.0)

## 2014-11-08 LAB — CBC
HCT: 38.8 % (ref 36.0–46.0)
Hemoglobin: 12.7 g/dL (ref 12.0–15.0)
MCHC: 32.8 g/dL (ref 30.0–36.0)
MCV: 98.8 fl (ref 78.0–100.0)
Platelets: 245 10*3/uL (ref 150.0–400.0)
RBC: 3.93 Mil/uL (ref 3.87–5.11)
RDW: 14.2 % (ref 11.5–15.5)
WBC: 10.3 10*3/uL (ref 4.0–10.5)

## 2014-11-08 LAB — COMPREHENSIVE METABOLIC PANEL
ALT: 13 U/L (ref 0–35)
AST: 18 U/L (ref 0–37)
Albumin: 4.4 g/dL (ref 3.5–5.2)
Alkaline Phosphatase: 58 U/L (ref 39–117)
BUN: 19 mg/dL (ref 6–23)
CO2: 37 mEq/L — ABNORMAL HIGH (ref 19–32)
Calcium: 10 mg/dL (ref 8.4–10.5)
Chloride: 95 mEq/L — ABNORMAL LOW (ref 96–112)
Creatinine, Ser: 0.66 mg/dL (ref 0.40–1.20)
GFR: 94.77 mL/min (ref >60.00)
Glucose, Bld: 99 mg/dL (ref 70–99)
Potassium: 4.6 mEq/L (ref 3.5–5.1)
Sodium: 137 mEq/L (ref 135–145)
Total Bilirubin: 0.2 mg/dL (ref 0.2–1.2)
Total Protein: 7.4 g/dL (ref 6.0–8.3)

## 2014-11-08 LAB — TSH: TSH: 0.52 u[IU]/mL (ref 0.35–4.50)

## 2014-11-08 LAB — HEMOGLOBIN A1C: HEMOGLOBIN A1C: 6.1 % (ref 4.6–6.5)

## 2014-11-13 ENCOUNTER — Encounter: Payer: Self-pay | Admitting: Family Medicine

## 2014-11-13 DIAGNOSIS — R413 Other amnesia: Secondary | ICD-10-CM

## 2014-11-13 HISTORY — DX: Other amnesia: R41.3

## 2014-11-13 NOTE — Assessment & Plan Note (Signed)
No recent flares 

## 2014-11-13 NOTE — Assessment & Plan Note (Signed)
Subjective but patient scores a 30/30 on MMSE. Will continue to monitor

## 2014-11-13 NOTE — Assessment & Plan Note (Signed)
hgba1c acceptable, minimize simple carbs. Increase exercise as tolerated. Continue current meds 

## 2014-11-13 NOTE — Assessment & Plan Note (Signed)
Encouraged heart healthy diet, increase exercise, avoid trans fats, consider a krill oil cap daily. Tolerating Atorvastatin 

## 2014-11-13 NOTE — Progress Notes (Signed)
Tanya Rush  [id number] [birth year] 11/13/2014      Progress Note-Follow Up  Subjective  Chief Complaint  Chief Complaint  Patient presents with  . Follow-up    6 mo    HPI  Patient is a 69 y.o. female in today for routine medical care. She is in follow-up. Her only major complaint is of some mild memory issues. She says she occasionally forgets words when speaking. She is not having trouble remembering to pay bills or doing even simple things in her home but her family noticed so she was bringing this to our attention. No other recent illness or acute concerns. Denies CP/palp/SOB/HA/congestion/fevers/GI or GU c/o. Taking meds as prescribed  Past Medical History  Diagnosis Date  . COPD (chronic obstructive pulmonary disease)     severe; O2 dependent-- followed by pulm at [facility]  . Asthma   . Genital herpes   . History of ovarian cyst   . Elevated BP 09/28/2012  . Scabies 01/16/2013  . Sun-damaged skin 04/04/2013  . Medicare annual wellness visit, subsequent 05/04/2014  . Overactive bladder 05/04/2014  . Memory impairment 11/13/2014    History reviewed. No pertinent past surgical history.  Family History  Problem Relation Age of Onset  . Heart disease Mother   . Stroke Mother   . Arthritis Father     rheumatoid  . Lymphoma Father   . COPD Father     smoker  . Alcohol abuse Brother     TBI  . Cancer Neg Hx   . Alcohol abuse Son   . Alcohol abuse Son     recovering x 3 years  . Reye's syndrome Brother     History   Social History  . Marital Status: Married    Spouse Name: N/A  . Number of Children: 3  . Years of Education: N/A   Occupational History  . Not on file.   Social History Main Topics  . Smoking status: Former Smoker -- 74 years    Quit date: 08/05/2006  . Smokeless tobacco: Never Used  . Alcohol Use: No  . Drug Use: No  . Sexual Activity: No     Comment: lives with husband, no dietary restrictions   Other Topics Concern  . Not on file     Social History Narrative   3 children: 2 adopted, 1 biological          Current Outpatient Prescriptions on File Prior to Visit  Medication Sig Dispense Refill  . albuterol (PROAIR HFA) 108 (90 BASE) MCG/ACT inhaler Inhale 2 puffs into the lungs daily as needed.      Marland Kitchen atorvastatin (LIPITOR) 20 MG tablet TAKE 1 TABLET AT 6PM 90 tablet 0  . Fluticasone-Salmeterol (ADVAIR DISKUS) 250-50 MCG/DOSE AEPB Inhale 1 puff into the lungs every 12 (twelve) hours.     . hydrochlorothiazide (MICROZIDE) 12.5 MG capsule Take 1 capsule (12.5 mg total) by mouth daily. 90 capsule 1  . Multiple Vitamin (MULTIVITAMIN) tablet Take 1 tablet by mouth daily.    Marland Kitchen oxybutynin (OXYTROL) 3.9 MG/24HR Place 1 patch onto the skin as directed. Change patch every 4 days    . tiotropium (SPIRIVA) 18 MCG inhalation capsule Place 18 mcg into inhaler and inhale daily.      . valACYclovir (VALTREX) 1000 MG tablet Take 1 tablet (1,000 mg total) by mouth daily. 90 tablet 1   Current Facility-Administered Medications on File Prior to Visit  Medication Dose Route Frequency Provider Last Rate Last Dose  .  pneumococcal 13-valent conjugate vaccine (PREVNAR 13) injection 0.5 mL  0.5 mL Intramuscular Once Mosie Lukes, MD        Allergies  Allergen Reactions  . Aspirin     REACTION: sever chest pain    Review of Systems  Review of Systems  Constitutional: Negative for fever, chills and malaise/fatigue.  HENT: Negative for congestion, hearing loss and nosebleeds.   Eyes: Negative for discharge.  Respiratory: Negative for cough, sputum production, shortness of breath and wheezing.   Cardiovascular: Negative for chest pain, palpitations and leg swelling.  Gastrointestinal: Negative for heartburn, nausea, vomiting, abdominal pain, diarrhea, constipation and blood in stool.  Genitourinary: Negative for dysuria, urgency, frequency and hematuria.  Musculoskeletal: Negative for myalgias, back pain and falls.  Skin: Negative  for rash.  Neurological: Negative for dizziness, tremors, sensory change, focal weakness, loss of consciousness, weakness and headaches.  Endo/Heme/Allergies: Negative for polydipsia. Does not bruise/bleed easily.  Psychiatric/Behavioral: Negative for depression and suicidal ideas. The patient is not nervous/anxious and does not have insomnia.     Objective  BP 124/82 mmHg  Pulse 91  Temp(Src) 97.9 F (36.6 C) (Oral)  Resp 20  Ht 4\' 11"  (1.499 m)  Wt 141 lb (63.957 kg)  BMI 28.46 kg/m2  SpO2 96%  Physical Exam  Physical Exam  Constitutional: She is oriented to person, place, and time and well-developed, well-nourished, and in no distress. No distress.  HENT:  Head: Normocephalic and atraumatic.  Eyes: Conjunctivae are normal.  Neck: Neck supple. No thyromegaly present.  Cardiovascular: Normal rate, regular rhythm and normal heart sounds.   No murmur heard. Pulmonary/Chest: Effort normal and breath sounds normal. She has no wheezes.  Abdominal: She exhibits no distension and no mass.  Musculoskeletal: She exhibits no edema.  Lymphadenopathy:    She has no cervical adenopathy.  Neurological: She is alert and oriented to person, place, and time.  Skin: Skin is warm and dry. No rash noted. She is not diaphoretic.  Psychiatric: Memory, affect and judgment normal.    Lab Results  Component Value Date   TSH 0.52 11/07/2014   Lab Results  Component Value Date   WBC 10.3 11/07/2014   HGB 12.7 11/07/2014   HCT 38.8 11/07/2014   MCV 98.8 11/07/2014   PLT 245.0 11/07/2014   Lab Results  Component Value Date   CREATININE 0.66 11/07/2014   BUN 19 11/07/2014   NA 137 11/07/2014   K 4.6 11/07/2014   CL 95* 11/07/2014   CO2 37* 11/07/2014   Lab Results  Component Value Date   ALT 13 11/07/2014   AST 18 11/07/2014   ALKPHOS 58 11/07/2014   BILITOT 0.2 11/07/2014   Lab Results  Component Value Date   CHOL 172 11/07/2014   Lab Results  Component Value Date   HDL  58.40 11/07/2014   Lab Results  Component Value Date   LDLCALC 81 11/07/2014   Lab Results  Component Value Date   TRIG 164.0* 11/07/2014   Lab Results  Component Value Date   CHOLHDL 3 11/07/2014     Assessment & Plan  COPD (chronic obstructive pulmonary disease) No recent flares   Hyperlipidemia, mixed Encouraged heart healthy diet, increase exercise, avoid trans fats, consider a krill oil cap daily. Tolerating Atorvastatin   Hyperglycemia hgba1c acceptable, minimize simple carbs. Increase exercise as tolerated. Continue current meds   Elevated BP Well controlled, no changes to meds. Encouraged heart healthy diet such as the DASH diet and exercise as  tolerated.    Memory impairment Subjective but patient scores a 30/30 on MMSE. Will continue to monitor

## 2014-11-13 NOTE — Assessment & Plan Note (Signed)
Well controlled, no changes to meds. Encouraged heart healthy diet such as the DASH diet and exercise as tolerated.  °

## 2014-11-24 ENCOUNTER — Other Ambulatory Visit: Payer: Self-pay | Admitting: Family Medicine

## 2014-11-24 DIAGNOSIS — Z1231 Encounter for screening mammogram for malignant neoplasm of breast: Secondary | ICD-10-CM

## 2014-12-05 DIAGNOSIS — J449 Chronic obstructive pulmonary disease, unspecified: Secondary | ICD-10-CM | POA: Diagnosis not present

## 2014-12-06 ENCOUNTER — Ambulatory Visit (HOSPITAL_BASED_OUTPATIENT_CLINIC_OR_DEPARTMENT_OTHER)
Admission: RE | Admit: 2014-12-06 | Discharge: 2014-12-06 | Disposition: A | Discharge status: Home / Self Care | Payer: Medicare Other | Source: Ambulatory Visit | Attending: Family Medicine | Admitting: Family Medicine

## 2014-12-06 DIAGNOSIS — Z1231 Encounter for screening mammogram for malignant neoplasm of breast: Secondary | ICD-10-CM

## 2014-12-06 DIAGNOSIS — R911 Solitary pulmonary nodule: Secondary | ICD-10-CM | POA: Diagnosis not present

## 2014-12-06 DIAGNOSIS — R0902 Hypoxemia: Secondary | ICD-10-CM | POA: Diagnosis not present

## 2014-12-06 DIAGNOSIS — J441 Chronic obstructive pulmonary disease with (acute) exacerbation: Secondary | ICD-10-CM | POA: Diagnosis not present

## 2014-12-07 DIAGNOSIS — J441 Chronic obstructive pulmonary disease with (acute) exacerbation: Secondary | ICD-10-CM | POA: Diagnosis not present

## 2014-12-08 DIAGNOSIS — J441 Chronic obstructive pulmonary disease with (acute) exacerbation: Secondary | ICD-10-CM | POA: Diagnosis not present

## 2014-12-30 DIAGNOSIS — J441 Chronic obstructive pulmonary disease with (acute) exacerbation: Secondary | ICD-10-CM | POA: Diagnosis not present

## 2015-01-05 DIAGNOSIS — J449 Chronic obstructive pulmonary disease, unspecified: Secondary | ICD-10-CM | POA: Diagnosis not present

## 2015-01-08 DIAGNOSIS — J441 Chronic obstructive pulmonary disease with (acute) exacerbation: Secondary | ICD-10-CM | POA: Diagnosis not present

## 2015-01-31 DIAGNOSIS — J441 Chronic obstructive pulmonary disease with (acute) exacerbation: Secondary | ICD-10-CM | POA: Diagnosis not present

## 2015-02-04 DIAGNOSIS — J441 Chronic obstructive pulmonary disease with (acute) exacerbation: Secondary | ICD-10-CM | POA: Diagnosis not present

## 2015-02-04 DIAGNOSIS — J449 Chronic obstructive pulmonary disease, unspecified: Secondary | ICD-10-CM | POA: Diagnosis not present

## 2015-02-07 DIAGNOSIS — J441 Chronic obstructive pulmonary disease with (acute) exacerbation: Secondary | ICD-10-CM | POA: Diagnosis not present

## 2015-02-23 ENCOUNTER — Other Ambulatory Visit: Payer: Self-pay | Admitting: Family Medicine

## 2015-03-02 DIAGNOSIS — J441 Chronic obstructive pulmonary disease with (acute) exacerbation: Secondary | ICD-10-CM | POA: Diagnosis not present

## 2015-03-07 DIAGNOSIS — J449 Chronic obstructive pulmonary disease, unspecified: Secondary | ICD-10-CM | POA: Diagnosis not present

## 2015-03-07 DIAGNOSIS — J441 Chronic obstructive pulmonary disease with (acute) exacerbation: Secondary | ICD-10-CM | POA: Diagnosis not present

## 2015-03-10 DIAGNOSIS — J441 Chronic obstructive pulmonary disease with (acute) exacerbation: Secondary | ICD-10-CM | POA: Diagnosis not present

## 2015-03-31 DIAGNOSIS — J441 Chronic obstructive pulmonary disease with (acute) exacerbation: Secondary | ICD-10-CM | POA: Diagnosis not present

## 2015-04-07 DIAGNOSIS — J441 Chronic obstructive pulmonary disease with (acute) exacerbation: Secondary | ICD-10-CM | POA: Diagnosis not present

## 2015-04-07 DIAGNOSIS — J449 Chronic obstructive pulmonary disease, unspecified: Secondary | ICD-10-CM | POA: Diagnosis not present

## 2015-04-10 DIAGNOSIS — J441 Chronic obstructive pulmonary disease with (acute) exacerbation: Secondary | ICD-10-CM | POA: Diagnosis not present

## 2015-04-25 DIAGNOSIS — J441 Chronic obstructive pulmonary disease with (acute) exacerbation: Secondary | ICD-10-CM | POA: Diagnosis not present

## 2015-04-28 DIAGNOSIS — H0015 Chalazion left lower eyelid: Secondary | ICD-10-CM | POA: Diagnosis not present

## 2015-05-07 DIAGNOSIS — J441 Chronic obstructive pulmonary disease with (acute) exacerbation: Secondary | ICD-10-CM | POA: Diagnosis not present

## 2015-05-07 DIAGNOSIS — J449 Chronic obstructive pulmonary disease, unspecified: Secondary | ICD-10-CM | POA: Diagnosis not present

## 2015-05-10 ENCOUNTER — Telehealth: Payer: Self-pay | Admitting: Behavioral Health

## 2015-05-10 DIAGNOSIS — J441 Chronic obstructive pulmonary disease with (acute) exacerbation: Secondary | ICD-10-CM | POA: Diagnosis not present

## 2015-05-10 NOTE — Telephone Encounter (Signed)
Unable to reach patient at time of Pre-Visit Call.  Left message for patient to return call when available.    

## 2015-05-11 ENCOUNTER — Encounter: Payer: Self-pay | Admitting: Family Medicine

## 2015-05-11 ENCOUNTER — Ambulatory Visit (INDEPENDENT_AMBULATORY_CARE_PROVIDER_SITE_OTHER): Payer: Medicare Other | Admitting: Family Medicine

## 2015-05-11 VITALS — BP 132/86 | HR 95 | Temp 98.3°F | Ht 59.0 in | Wt 140.5 lb

## 2015-05-11 DIAGNOSIS — R739 Hyperglycemia, unspecified: Secondary | ICD-10-CM | POA: Diagnosis not present

## 2015-05-11 DIAGNOSIS — R0602 Shortness of breath: Secondary | ICD-10-CM | POA: Diagnosis not present

## 2015-05-11 DIAGNOSIS — R030 Elevated blood-pressure reading, without diagnosis of hypertension: Secondary | ICD-10-CM

## 2015-05-11 DIAGNOSIS — J449 Chronic obstructive pulmonary disease, unspecified: Secondary | ICD-10-CM

## 2015-05-11 DIAGNOSIS — Z0000 Encounter for general adult medical examination without abnormal findings: Secondary | ICD-10-CM

## 2015-05-11 DIAGNOSIS — M810 Age-related osteoporosis without current pathological fracture: Secondary | ICD-10-CM | POA: Diagnosis not present

## 2015-05-11 DIAGNOSIS — Z780 Asymptomatic menopausal state: Secondary | ICD-10-CM

## 2015-05-11 DIAGNOSIS — E782 Mixed hyperlipidemia: Secondary | ICD-10-CM

## 2015-05-11 DIAGNOSIS — R0902 Hypoxemia: Secondary | ICD-10-CM | POA: Diagnosis not present

## 2015-05-11 DIAGNOSIS — Z23 Encounter for immunization: Secondary | ICD-10-CM | POA: Diagnosis not present

## 2015-05-11 DIAGNOSIS — R2681 Unsteadiness on feet: Secondary | ICD-10-CM

## 2015-05-11 DIAGNOSIS — IMO0001 Reserved for inherently not codable concepts without codable children: Secondary | ICD-10-CM

## 2015-05-11 NOTE — Patient Instructions (Signed)
Preventive Care for Adults, Female A healthy lifestyle and preventive care can promote health and wellness. Preventive health guidelines for women include the following key practices.  A routine yearly physical is a good way to check with your health care provider about your health and preventive screening. It is a chance to share any concerns and updates on your health and to receive a thorough exam.  Visit your dentist for a routine exam and preventive care every 6 months. Brush your teeth twice a day and floss once a day. Good oral hygiene prevents tooth decay and gum disease.  The frequency of eye exams is based on your age, health, family medical history, use of contact lenses, and other factors. Follow your health care provider's recommendations for frequency of eye exams.  Eat a healthy diet. Foods like vegetables, fruits, whole grains, low-fat dairy products, and lean protein foods contain the nutrients you need without too many calories. Decrease your intake of foods high in solid fats, added sugars, and salt. Eat the right amount of calories for you.Get information about a proper diet from your health care provider, if necessary.  Regular physical exercise is one of the most important things you can do for your health. Most adults should get at least 150 minutes of moderate-intensity exercise (any activity that increases your heart rate and causes you to sweat) each week. In addition, most adults need muscle-strengthening exercises on 2 or more days a week.  Maintain a healthy weight. The body mass index (BMI) is a screening tool to identify possible weight problems. It provides an estimate of body fat based on height and weight. Your health care provider can find your BMI and can help you achieve or maintain a healthy weight.For adults 20 years and older:  A BMI below 18.5 is considered underweight.  A BMI of 18.5 to 24.9 is normal.  A BMI of 25 to 29.9 is considered  overweight.  A BMI of 30 and above is considered obese.  Maintain normal blood lipids and cholesterol levels by exercising and minimizing your intake of saturated fat. Eat a balanced diet with plenty of fruit and vegetables. Blood tests for lipids and cholesterol should begin at age 64 and be repeated every 5 years. If your lipid or cholesterol levels are high, you are over 50, or you are at high risk for heart disease, you may need your cholesterol levels checked more frequently.Ongoing high lipid and cholesterol levels should be treated with medicines if diet and exercise are not working.  If you smoke, find out from your health care provider how to quit. If you do not use tobacco, do not start.  Lung cancer screening is recommended for adults aged 52-80 years who are at high risk for developing lung cancer because of a history of smoking. A yearly low-dose CT scan of the lungs is recommended for people who have at least a 30-pack-year history of smoking and are a current smoker or have quit within the past 15 years. A pack year of smoking is smoking an average of 1 pack of cigarettes a day for 1 year (for example: 1 pack a day for 30 years or 2 packs a day for 15 years). Yearly screening should continue until the smoker has stopped smoking for at least 15 years. Yearly screening should be stopped for people who develop a health problem that would prevent them from having lung cancer treatment.  If you are pregnant, do not drink alcohol. If you are  breastfeeding, be very cautious about drinking alcohol. If you are not pregnant and choose to drink alcohol, do not have more than 1 drink per day. One drink is considered to be 12 ounces (355 mL) of beer, 5 ounces (148 mL) of wine, or 1.5 ounces (44 mL) of liquor.  Avoid use of street drugs. Do not share needles with anyone. Ask for help if you need support or instructions about stopping the use of drugs.  High blood pressure causes heart disease and  increases the risk of stroke. Your blood pressure should be checked at least every 1 to 2 years. Ongoing high blood pressure should be treated with medicines if weight loss and exercise do not work.  If you are 25-78 years old, ask your health care provider if you should take aspirin to prevent strokes.  Diabetes screening is done by taking a blood sample to check your blood glucose level after you have not eaten for a certain period of time (fasting). If you are not overweight and you do not have risk factors for diabetes, you should be screened once every 3 years starting at age 86. If you are overweight or obese and you are 3-87 years of age, you should be screened for diabetes every year as part of your cardiovascular risk assessment.  Breast cancer screening is essential preventive care for women. You should practice "breast self-awareness." This means understanding the normal appearance and feel of your breasts and may include breast self-examination. Any changes detected, no matter how small, should be reported to a health care provider. Women in their 66s and 30s should have a clinical breast exam (CBE) by a health care provider as part of a regular health exam every 1 to 3 years. After age 43, women should have a CBE every year. Starting at age 37, women should consider having a mammogram (breast X-ray test) every year. Women who have a family history of breast cancer should talk to their health care provider about genetic screening. Women at a high risk of breast cancer should talk to their health care providers about having an MRI and a mammogram every year.  Breast cancer gene (BRCA)-related cancer risk assessment is recommended for women who have family members with BRCA-related cancers. BRCA-related cancers include breast, ovarian, tubal, and peritoneal cancers. Having family members with these cancers may be associated with an increased risk for harmful changes (mutations) in the breast  cancer genes BRCA1 and BRCA2. Results of the assessment will determine the need for genetic counseling and BRCA1 and BRCA2 testing.  Your health care provider may recommend that you be screened regularly for cancer of the pelvic organs (ovaries, uterus, and vagina). This screening involves a pelvic examination, including checking for microscopic changes to the surface of your cervix (Pap test). You may be encouraged to have this screening done every 3 years, beginning at age 78.  For women ages 79-65, health care providers may recommend pelvic exams and Pap testing every 3 years, or they may recommend the Pap and pelvic exam, combined with testing for human papilloma virus (HPV), every 5 years. Some types of HPV increase your risk of cervical cancer. Testing for HPV may also be done on women of any age with unclear Pap test results.  Other health care providers may not recommend any screening for nonpregnant women who are considered low risk for pelvic cancer and who do not have symptoms. Ask your health care provider if a screening pelvic exam is right for  you.  If you have had past treatment for cervical cancer or a condition that could lead to cancer, you need Pap tests and screening for cancer for at least 20 years after your treatment. If Pap tests have been discontinued, your risk factors (such as having a new sexual partner) need to be reassessed to determine if screening should resume. Some women have medical problems that increase the chance of getting cervical cancer. In these cases, your health care provider may recommend more frequent screening and Pap tests.  Colorectal cancer can be detected and often prevented. Most routine colorectal cancer screening begins at the age of 50 years and continues through age 75 years. However, your health care provider may recommend screening at an earlier age if you have risk factors for colon cancer. On a yearly basis, your health care provider may provide  home test kits to check for hidden blood in the stool. Use of a small camera at the end of a tube, to directly examine the colon (sigmoidoscopy or colonoscopy), can detect the earliest forms of colorectal cancer. Talk to your health care provider about this at age 50, when routine screening begins. Direct exam of the colon should be repeated every 5-10 years through age 75 years, unless early forms of precancerous polyps or small growths are found.  People who are at an increased risk for hepatitis B should be screened for this virus. You are considered at high risk for hepatitis B if:  You were born in a country where hepatitis B occurs often. Talk with your health care provider about which countries are considered high risk.  Your parents were born in a high-risk country and you have not received a shot to protect against hepatitis B (hepatitis B vaccine).  You have HIV or AIDS.  You use needles to inject street drugs.  You live with, or have sex with, someone who has hepatitis B.  You get hemodialysis treatment.  You take certain medicines for conditions like cancer, organ transplantation, and autoimmune conditions.  Hepatitis C blood testing is recommended for all people born from 1945 through 1965 and any individual with known risks for hepatitis C.  Practice safe sex. Use condoms and avoid high-risk sexual practices to reduce the spread of sexually transmitted infections (STIs). STIs include gonorrhea, chlamydia, syphilis, trichomonas, herpes, HPV, and human immunodeficiency virus (HIV). Herpes, HIV, and HPV are viral illnesses that have no cure. They can result in disability, cancer, and death.  You should be screened for sexually transmitted illnesses (STIs) including gonorrhea and chlamydia if:  You are sexually active and are younger than 24 years.  You are older than 24 years and your health care provider tells you that you are at risk for this type of infection.  Your sexual  activity has changed since you were last screened and you are at an increased risk for chlamydia or gonorrhea. Ask your health care provider if you are at risk.  If you are at risk of being infected with HIV, it is recommended that you take a prescription medicine daily to prevent HIV infection. This is called preexposure prophylaxis (PrEP). You are considered at risk if:  You are sexually active and do not regularly use condoms or know the HIV status of your partner(s).  You take drugs by injection.  You are sexually active with a partner who has HIV.  Talk with your health care provider about whether you are at high risk of being infected with HIV. If   you choose to begin PrEP, you should first be tested for HIV. You should then be tested every 3 months for as long as you are taking PrEP.  Osteoporosis is a disease in which the bones lose minerals and strength with aging. This can result in serious bone fractures or breaks. The risk of osteoporosis can be identified using a bone density scan. Women ages 1 years and over and women at risk for fractures or osteoporosis should discuss screening with their health care providers. Ask your health care provider whether you should take a calcium supplement or vitamin D to reduce the rate of osteoporosis.  Menopause can be associated with physical symptoms and risks. Hormone replacement therapy is available to decrease symptoms and risks. You should talk to your health care provider about whether hormone replacement therapy is right for you.  Use sunscreen. Apply sunscreen liberally and repeatedly throughout the day. You should seek shade when your shadow is shorter than you. Protect yourself by wearing long sleeves, pants, a wide-brimmed hat, and sunglasses year round, whenever you are outdoors.  Once a month, do a whole body skin exam, using a mirror to look at the skin on your back. Tell your health care provider of new moles, moles that have irregular  borders, moles that are larger than a pencil eraser, or moles that have changed in shape or color.  Stay current with required vaccines (immunizations).  Influenza vaccine. All adults should be immunized every year.  Tetanus, diphtheria, and acellular pertussis (Td, Tdap) vaccine. Pregnant women should receive 1 dose of Tdap vaccine during each pregnancy. The dose should be obtained regardless of the length of time since the last dose. Immunization is preferred during the 27th-36th week of gestation. An adult who has not previously received Tdap or who does not know her vaccine status should receive 1 dose of Tdap. This initial dose should be followed by tetanus and diphtheria toxoids (Td) booster doses every 10 years. Adults with an unknown or incomplete history of completing a 3-dose immunization series with Td-containing vaccines should begin or complete a primary immunization series including a Tdap dose. Adults should receive a Td booster every 10 years.  Varicella vaccine. An adult without evidence of immunity to varicella should receive 2 doses or a second dose if she has previously received 1 dose. Pregnant females who do not have evidence of immunity should receive the first dose after pregnancy. This first dose should be obtained before leaving the health care facility. The second dose should be obtained 4-8 weeks after the first dose.  Human papillomavirus (HPV) vaccine. Females aged 13-26 years who have not received the vaccine previously should obtain the 3-dose series. The vaccine is not recommended for use in pregnant females. However, pregnancy testing is not needed before receiving a dose. If a female is found to be pregnant after receiving a dose, no treatment is needed. In that case, the remaining doses should be delayed until after the pregnancy. Immunization is recommended for any person with an immunocompromised condition through the age of 24 years if she did not get any or all doses  earlier. During the 3-dose series, the second dose should be obtained 4-8 weeks after the first dose. The third dose should be obtained 24 weeks after the first dose and 16 weeks after the second dose.  Zoster vaccine. One dose is recommended for adults aged 97 years or older unless certain conditions are present.  Measles, mumps, and rubella (MMR) vaccine. Adults born  before 1957 generally are considered immune to measles and mumps. Adults born in [birth year] or later should have 1 or more doses of MMR vaccine unless there is a contraindication to the vaccine or there is laboratory evidence of immunity to each of the three diseases. A routine second dose of MMR vaccine should be obtained at least 28 days after the first dose for students attending postsecondary schools, health care workers, or international travelers. People who received inactivated measles vaccine or an unknown type of measles vaccine during 1963-1967 should receive 2 doses of MMR vaccine. People who received inactivated mumps vaccine or an unknown type of mumps vaccine before 1979 and are at high risk for mumps infection should consider immunization with 2 doses of MMR vaccine. For females of childbearing age, rubella immunity should be determined. If there is no evidence of immunity, females who are not pregnant should be vaccinated. If there is no evidence of immunity, females who are pregnant should delay immunization until after pregnancy. Unvaccinated health care workers born before 60 who lack laboratory evidence of measles, mumps, or rubella immunity or laboratory confirmation of disease should consider measles and mumps immunization with 2 doses of MMR vaccine or rubella immunization with 1 dose of MMR vaccine.  Pneumococcal 13-valent conjugate (PCV13) vaccine. When indicated, a person who is uncertain of his immunization history and has no record of immunization should receive the PCV13 vaccine. All adults 61 years of age and older  should receive this vaccine. An adult aged 92 years or older who has certain medical conditions and has not been previously immunized should receive 1 dose of PCV13 vaccine. This PCV13 should be followed with a dose of pneumococcal polysaccharide (PPSV23) vaccine. Adults who are at high risk for pneumococcal disease should obtain the PPSV23 vaccine at least 8 weeks after the dose of PCV13 vaccine. Adults older than 68 years of age who have normal immune system function should obtain the PPSV23 vaccine dose at least 1 year after the dose of PCV13 vaccine.  Pneumococcal polysaccharide (PPSV23) vaccine. When PCV13 is also indicated, PCV13 should be obtained first. All adults aged 2 years and older should be immunized. An adult younger than age 30 years who has certain medical conditions should be immunized. Any person who resides in a nursing home or long-term care facility should be immunized. An adult smoker should be immunized. People with an immunocompromised condition and certain other conditions should receive both PCV13 and PPSV23 vaccines. People with human immunodeficiency virus (HIV) infection should be immunized as soon as possible after diagnosis. Immunization during chemotherapy or radiation therapy should be avoided. Routine use of PPSV23 vaccine is not recommended for American Indians, [place] Natives, or people younger than 65 years unless there are medical conditions that require PPSV23 vaccine. When indicated, people who have unknown immunization and have no record of immunization should receive PPSV23 vaccine. One-time revaccination 5 years after the first dose of PPSV23 is recommended for people aged 19-64 years who have chronic kidney failure, nephrotic syndrome, asplenia, or immunocompromised conditions. People who received 1-2 doses of PPSV23 before age 44 years should receive another dose of PPSV23 vaccine at age 83 years or later if at least 5 years have passed since the previous dose. Doses  of PPSV23 are not needed for people immunized with PPSV23 at or after age 20 years.  Meningococcal vaccine. Adults with asplenia or persistent complement component deficiencies should receive 2 doses of quadrivalent meningococcal conjugate (MenACWY-D) vaccine. The doses should be obtained  at least 2 months apart. Microbiologists working with certain meningococcal bacteria, [place] recruits, people at risk during an outbreak, and people who travel to or live in countries with a high rate of meningitis should be immunized. A first-year college student up through age 28 years who is living in a residence hall should receive a dose if she did not receive a dose on or after her 16th birthday. Adults who have certain high-risk conditions should receive one or more doses of vaccine.  Hepatitis A vaccine. Adults who wish to be protected from this disease, have certain high-risk conditions, work with hepatitis A-infected animals, work in hepatitis A research labs, or travel to or work in countries with a high rate of hepatitis A should be immunized. Adults who were previously unvaccinated and who anticipate close contact with an international adoptee during the first 60 days after arrival in the [place] from a country with a high rate of hepatitis A should be immunized.  Hepatitis B vaccine. Adults who wish to be protected from this disease, have certain high-risk conditions, may be exposed to blood or other infectious body fluids, are household contacts or sex partners of hepatitis B positive people, are clients or workers in certain care facilities, or travel to or work in countries with a high rate of hepatitis B should be immunized.  Haemophilus influenzae type b (Hib) vaccine. A previously unvaccinated person with asplenia or sickle cell disease or having a scheduled splenectomy should receive 1 dose of Hib vaccine. Regardless of previous immunization, a recipient of a hematopoietic stem cell transplant  should receive a 3-dose series 6-12 months after her successful transplant. Hib vaccine is not recommended for adults with HIV infection. Preventive Services / Frequency Ages 71 to 87 years  Blood pressure check.** / Every 3-5 years.  Lipid and cholesterol check.** / Every 5 years beginning at age 1.  Clinical breast exam.** / Every 3 years for women in their 3s and 31s.  BRCA-related cancer risk assessment.** / For women who have family members with a BRCA-related cancer (breast, ovarian, tubal, or peritoneal cancers).  Pap test.** / Every 2 years from ages 50 through 86. Every 3 years starting at age 87 through age 7 or 75 with a history of 3 consecutive normal Pap tests.  HPV screening.** / Every 3 years from ages 59 through ages 35 to 6 with a history of 3 consecutive normal Pap tests.  Hepatitis C blood test.** / For any individual with known risks for hepatitis C.  Skin self-exam. / Monthly.  Influenza vaccine. / Every year.  Tetanus, diphtheria, and acellular pertussis (Tdap, Td) vaccine.** / Consult your health care provider. Pregnant women should receive 1 dose of Tdap vaccine during each pregnancy. 1 dose of Td every 10 years.  Varicella vaccine.** / Consult your health care provider. Pregnant females who do not have evidence of immunity should receive the first dose after pregnancy.  HPV vaccine. / 3 doses over 6 months, if 72 and younger. The vaccine is not recommended for use in pregnant females. However, pregnancy testing is not needed before receiving a dose.  Measles, mumps, rubella (MMR) vaccine.** / You need at least 1 dose of MMR if you were born in 1957 or later. You may also need a 2nd dose. For females of childbearing age, rubella immunity should be determined. If there is no evidence of immunity, females who are not pregnant should be vaccinated. If there is no evidence of immunity, females who are  pregnant should delay immunization until after  pregnancy.  Pneumococcal 13-valent conjugate (PCV13) vaccine.** / Consult your health care provider.  Pneumococcal polysaccharide (PPSV23) vaccine.** / 1 to 2 doses if you smoke cigarettes or if you have certain conditions.  Meningococcal vaccine.** / 1 dose if you are age 87 to 44 years and a Market researcher living in a residence hall, or have one of several medical conditions, you need to get vaccinated against meningococcal disease. You may also need additional booster doses.  Hepatitis A vaccine.** / Consult your health care provider.  Hepatitis B vaccine.** / Consult your health care provider.  Haemophilus influenzae type b (Hib) vaccine.** / Consult your health care provider. Ages 86 to 38 years  Blood pressure check.** / Every year.  Lipid and cholesterol check.** / Every 5 years beginning at age 49 years.  Lung cancer screening. / Every year if you are aged 71-80 years and have a 30-pack-year history of smoking and currently smoke or have quit within the past 15 years. Yearly screening is stopped once you have quit smoking for at least 15 years or develop a health problem that would prevent you from having lung cancer treatment.  Clinical breast exam.** / Every year after age 51 years.  BRCA-related cancer risk assessment.** / For women who have family members with a BRCA-related cancer (breast, ovarian, tubal, or peritoneal cancers).  Mammogram.** / Every year beginning at age 18 years and continuing for as long as you are in good health. Consult with your health care provider.  Pap test.** / Every 3 years starting at age 63 years through age 37 or 57 years with a history of 3 consecutive normal Pap tests.  HPV screening.** / Every 3 years from ages 41 years through ages 76 to 23 years with a history of 3 consecutive normal Pap tests.  Fecal occult blood test (FOBT) of stool. / Every year beginning at age 36 years and continuing until age 51 years. You may not need  to do this test if you get a colonoscopy every 10 years.  Flexible sigmoidoscopy or colonoscopy.** / Every 5 years for a flexible sigmoidoscopy or every 10 years for a colonoscopy beginning at age 36 years and continuing until age 35 years.  Hepatitis C blood test.** / For all people born from [birth year] through 1965 and any individual with known risks for hepatitis C.  Skin self-exam. / Monthly.  Influenza vaccine. / Every year.  Tetanus, diphtheria, and acellular pertussis (Tdap/Td) vaccine.** / Consult your health care provider. Pregnant women should receive 1 dose of Tdap vaccine during each pregnancy. 1 dose of Td every 10 years.  Varicella vaccine.** / Consult your health care provider. Pregnant females who do not have evidence of immunity should receive the first dose after pregnancy.  Zoster vaccine.** / 1 dose for adults aged 73 years or older.  Measles, mumps, rubella (MMR) vaccine.** / You need at least 1 dose of MMR if you were born in 1957 or later. You may also need a second dose. For females of childbearing age, rubella immunity should be determined. If there is no evidence of immunity, females who are not pregnant should be vaccinated. If there is no evidence of immunity, females who are pregnant should delay immunization until after pregnancy.  Pneumococcal 13-valent conjugate (PCV13) vaccine.** / Consult your health care provider.  Pneumococcal polysaccharide (PPSV23) vaccine.** / 1 to 2 doses if you smoke cigarettes or if you have certain conditions.  Meningococcal vaccine.** /  Consult your health care provider.  Hepatitis A vaccine.** / Consult your health care provider.  Hepatitis B vaccine.** / Consult your health care provider.  Haemophilus influenzae type b (Hib) vaccine.** / Consult your health care provider. Ages 80 years and over  Blood pressure check.** / Every year.  Lipid and cholesterol check.** / Every 5 years beginning at age 62 years.  Lung cancer  screening. / Every year if you are aged 32-80 years and have a 30-pack-year history of smoking and currently smoke or have quit within the past 15 years. Yearly screening is stopped once you have quit smoking for at least 15 years or develop a health problem that would prevent you from having lung cancer treatment.  Clinical breast exam.** / Every year after age 61 years.  BRCA-related cancer risk assessment.** / For women who have family members with a BRCA-related cancer (breast, ovarian, tubal, or peritoneal cancers).  Mammogram.** / Every year beginning at age 39 years and continuing for as long as you are in good health. Consult with your health care provider.  Pap test.** / Every 3 years starting at age 85 years through age 74 or 72 years with 3 consecutive normal Pap tests. Testing can be stopped between 65 and 70 years with 3 consecutive normal Pap tests and no abnormal Pap or HPV tests in the past 10 years.  HPV screening.** / Every 3 years from ages 55 years through ages 67 or 77 years with a history of 3 consecutive normal Pap tests. Testing can be stopped between 65 and 70 years with 3 consecutive normal Pap tests and no abnormal Pap or HPV tests in the past 10 years.  Fecal occult blood test (FOBT) of stool. / Every year beginning at age 81 years and continuing until age 22 years. You may not need to do this test if you get a colonoscopy every 10 years.  Flexible sigmoidoscopy or colonoscopy.** / Every 5 years for a flexible sigmoidoscopy or every 10 years for a colonoscopy beginning at age 67 years and continuing until age 22 years.  Hepatitis C blood test.** / For all people born from [birth year] through 1964 and any individual with known risks for hepatitis C.  Osteoporosis screening.** / A one-time screening for women ages 8 years and over and women at risk for fractures or osteoporosis.  Skin self-exam. / Monthly.  Influenza vaccine. / Every year.  Tetanus, diphtheria, and  acellular pertussis (Tdap/Td) vaccine.** / 1 dose of Td every 10 years.  Varicella vaccine.** / Consult your health care provider.  Zoster vaccine.** / 1 dose for adults aged 56 years or older.  Pneumococcal 13-valent conjugate (PCV13) vaccine.** / Consult your health care provider.  Pneumococcal polysaccharide (PPSV23) vaccine.** / 1 dose for all adults aged 15 years and older.  Meningococcal vaccine.** / Consult your health care provider.  Hepatitis A vaccine.** / Consult your health care provider.  Hepatitis B vaccine.** / Consult your health care provider.  Haemophilus influenzae type b (Hib) vaccine.** / Consult your health care provider. ** Family history and personal history of risk and conditions may change your health care provider's recommendations.   This information is not intended to replace advice given to you by your health care provider. Make sure you discuss any questions you have with your health care provider.   Document Released: 09/17/2001 Document Revised: 08/12/2014 Document Reviewed: 12/17/2010 Elsevier Interactive Patient Education [employer].

## 2015-05-11 NOTE — Assessment & Plan Note (Signed)
Tolerating statin, encouraged heart healthy diet, avoid trans fats, minimize simple carbs and saturated fats. Increase exercise as tolerated 

## 2015-05-11 NOTE — Progress Notes (Signed)
Pre visit review using our clinic review tool, if applicable. No additional management support is needed unless otherwise documented below in the visit note. 

## 2015-05-14 ENCOUNTER — Encounter: Payer: Self-pay | Admitting: Family Medicine

## 2015-05-14 DIAGNOSIS — M810 Age-related osteoporosis without current pathological fracture: Secondary | ICD-10-CM | POA: Insufficient documentation

## 2015-05-14 DIAGNOSIS — R2681 Unsteadiness on feet: Secondary | ICD-10-CM | POA: Insufficient documentation

## 2015-05-14 HISTORY — DX: Unsteadiness on feet: R26.81

## 2015-05-14 HISTORY — DX: Age-related osteoporosis without current pathological fracture: M81.0

## 2015-05-14 NOTE — Assessment & Plan Note (Signed)
Encouraged to consider PT referral declines for now but will call if decides to go

## 2015-05-14 NOTE — Assessment & Plan Note (Signed)
Encouraged activity increase as tolerated especially with upper body, calcium via diet and supplements tid and vitamin D 2000 IU daily

## 2015-05-14 NOTE — Assessment & Plan Note (Signed)
Well controlled, no changes to meds. Encouraged heart healthy diet such as the DASH diet and exercise as tolerated.  °

## 2015-05-14 NOTE — Assessment & Plan Note (Signed)
No recent flares, follows with [facility]

## 2015-05-14 NOTE — Progress Notes (Signed)
Subjective:    Patient ID: Tanya Rush, female    DOB: Nov 11, 1945, 69 y.o.   MRN: [id number]  Chief Complaint  Patient presents with  . Medicare Wellness    HPI Patient is in today for annual exam and follow-up on numerous concerns. Overall feeling well. Continues to follow with [facility] chest and had her flu shot there. Is struggling with some recent weakness and gait and is encouraged to consider a course of physical therapy. Is using O2 via nasal cannula for shortness of breath on exertion and gets some relief from that. Has recently been seen by [facility] file left eye infection but that has improved. No other recent illness or acute concerns. Denies CP/palp/SOB/HA/congestion/fevers/GI or GU c/o. Taking meds as prescribed  Past Medical History  Diagnosis Date  . COPD (chronic obstructive pulmonary disease) (HCC)     severe; O2 dependent-- followed by pulm at [facility]  . Asthma   . Genital herpes   . History of ovarian cyst   . Elevated BP 09/28/2012  . Scabies 01/16/2013  . Sun-damaged skin 04/04/2013  . Medicare annual wellness visit, subsequent 05/04/2014  . Overactive bladder 05/04/2014  . Memory impairment 11/13/2014  . Osteoporosis 05/14/2015  . Unsteady gait 05/14/2015    History reviewed. No pertinent past surgical history.  Family History  Problem Relation Age of Onset  . Heart disease Mother   . Stroke Mother   . Arthritis Father     rheumatoid  . Lymphoma Father   . COPD Father     smoker  . Alcohol abuse Brother     TBI  . Birth defects Brother   . Cancer Neg Hx   . Alcohol abuse Son   . Alcohol abuse Son     recovering x 3 years  . Reye's syndrome Brother   . Birth defects Brother   . Hip fracture Maternal Aunt     Social History   Social History  . Marital Status: Married    Spouse Name: N/A  . Number of Children: 3  . Years of Education: N/A   Occupational History  . Not on file.   Social History Main Topics  . Smoking status: Former Smoker  -- 39 years    Quit date: 08/05/2006  . Smokeless tobacco: Never Used  . Alcohol Use: No  . Drug Use: No  . Sexual Activity: No     Comment: lives with husband, no dietary restrictions   Other Topics Concern  . Not on file   Social History Narrative   3 children: 2 adopted, 1 biological          Outpatient Prescriptions Prior to Visit  Medication Sig Dispense Refill  . albuterol (PROAIR HFA) 108 (90 BASE) MCG/ACT inhaler Inhale 2 puffs into the lungs daily as needed.      Marland Kitchen atorvastatin (LIPITOR) 20 MG tablet TAKE 1 TABLET AT 6PM 90 tablet 0  . Fluticasone-Salmeterol (ADVAIR DISKUS) 250-50 MCG/DOSE AEPB Inhale 1 puff into the lungs every 12 (twelve) hours.     . hydrochlorothiazide (MICROZIDE) 12.5 MG capsule Take 1 capsule (12.5 mg total) by mouth daily. 90 capsule 1  . Multiple Vitamin (MULTIVITAMIN) tablet Take 1 tablet by mouth daily.    Marland Kitchen oxybutynin (OXYTROL) 3.9 MG/24HR Place 1 patch onto the skin as directed. Change patch every 4 days    . tiotropium (SPIRIVA) 18 MCG inhalation capsule Place 18 mcg into inhaler and inhale daily.      Marland Kitchen  valACYclovir (VALTREX) 1000 MG tablet Take 1 tablet (1,000 mg total) by mouth daily. 90 tablet 1   Facility-Administered Medications Prior to Visit  Medication Dose Route Frequency Provider Last Rate Last Dose  . pneumococcal 13-valent conjugate vaccine (PREVNAR 13) injection 0.5 mL  0.5 mL Intramuscular Once Mosie Lukes, MD        Allergies  Allergen Reactions  . Aspirin     REACTION: sever chest pain    Review of Systems  Constitutional: Negative for fever, chills and malaise/fatigue.  HENT: Negative for congestion and hearing loss.   Eyes: Negative for discharge.  Respiratory: Negative for cough, sputum production and shortness of breath.   Cardiovascular: Negative for chest pain, palpitations and leg swelling.  Gastrointestinal: Negative for heartburn, nausea, vomiting, abdominal pain, diarrhea, constipation and blood in  stool.  Genitourinary: Negative for dysuria, urgency, frequency and hematuria.  Musculoskeletal: Negative for myalgias, back pain and falls.  Skin: Negative for rash.  Neurological: Negative for dizziness, sensory change, loss of consciousness, weakness and headaches.  Endo/Heme/Allergies: Negative for environmental allergies. Does not bruise/bleed easily.  Psychiatric/Behavioral: Negative for depression and suicidal ideas. The patient is not nervous/anxious and does not have insomnia.        Objective:    Physical Exam  Constitutional: She is oriented to person, place, and time. She appears well-developed and well-nourished. No distress.  HENT:  Head: Normocephalic and atraumatic.  Eyes: Conjunctivae are normal.  Neck: Neck supple. No thyromegaly present.  Cardiovascular: Normal rate, regular rhythm and normal heart sounds.   No murmur heard. Pulmonary/Chest: Effort normal and breath sounds normal. No respiratory distress.  Abdominal: Soft. Bowel sounds are normal. She exhibits no distension and no mass. There is no tenderness.  Musculoskeletal: She exhibits no edema.  Lymphadenopathy:    She has no cervical adenopathy.  Neurological: She is alert and oriented to person, place, and time.  Skin: Skin is warm and dry.  Psychiatric: She has a normal mood and affect. Her behavior is normal.    BP 132/86 mmHg  Pulse 95  Temp(Src) 98.3 F (36.8 C) (Oral)  Ht 4\' 11"  (1.499 m)  Wt 140 lb 8 oz (63.73 kg)  BMI 28.36 kg/m2  SpO2 92% Wt Readings from Last 3 Encounters:  05/11/15 140 lb 8 oz (63.73 kg)  11/07/14 141 lb (63.957 kg)  05/02/14 135 lb (61.236 kg)     Lab Results  Component Value Date   WBC 10.3 11/07/2014   HGB 12.7 11/07/2014   HCT 38.8 11/07/2014   PLT 245.0 11/07/2014   GLUCOSE 99 11/07/2014   CHOL 172 11/07/2014   TRIG 164.0* 11/07/2014   HDL 58.40 11/07/2014   LDLDIRECT 147.1 06/28/2008   LDLCALC 81 11/07/2014   ALT 13 11/07/2014   AST 18 11/07/2014    NA 137 11/07/2014   K 4.6 11/07/2014   CL 95* 11/07/2014   CREATININE 0.66 11/07/2014   BUN 19 11/07/2014   CO2 37* 11/07/2014   TSH 0.52 11/07/2014   HGBA1C 6.1 11/07/2014    Lab Results  Component Value Date   TSH 0.52 11/07/2014   Lab Results  Component Value Date   WBC 10.3 11/07/2014   HGB 12.7 11/07/2014   HCT 38.8 11/07/2014   MCV 98.8 11/07/2014   PLT 245.0 11/07/2014   Lab Results  Component Value Date   NA 137 11/07/2014   K 4.6 11/07/2014   CO2 37* 11/07/2014   GLUCOSE 99 11/07/2014   BUN 19 11/07/2014  CREATININE 0.66 11/07/2014   BILITOT 0.2 11/07/2014   ALKPHOS 58 11/07/2014   AST 18 11/07/2014   ALT 13 11/07/2014   PROT 7.4 11/07/2014   ALBUMIN 4.4 11/07/2014   CALCIUM 10.0 11/07/2014   GFR 94.77 11/07/2014   Lab Results  Component Value Date   CHOL 172 11/07/2014   Lab Results  Component Value Date   HDL 58.40 11/07/2014   Lab Results  Component Value Date   LDLCALC 81 11/07/2014   Lab Results  Component Value Date   TRIG 164.0* 11/07/2014   Lab Results  Component Value Date   CHOLHDL 3 11/07/2014   Lab Results  Component Value Date   HGBA1C 6.1 11/07/2014       Assessment & Plan:   Problem List Items Addressed This Visit    Unsteady gait    Encouraged to consider PT referral declines for now but will call if decides to go      Osteoporosis    Encouraged activity increase as tolerated especially with upper body, calcium via diet and supplements tid and vitamin D 2000 IU daily      Relevant Orders   DG Bone Density   Medicare annual wellness visit, subsequent    Patient denies any difficulties at home. No trouble with ADLs, depression or falls. No recent changes to vision or hearing. Is UTD with immunizations. Is UTD with screening. Discussed Advanced Directives, patient agrees to bring [place] copies of documents if can. Encouraged heart healthy diet, exercise as tolerated and adequate sleep. Labs reviewed. Follows with Dr  Vickki Muff, pulmonology at [place] with opthamology [facility] Declines colonoscopy Declines PAP MGM 2016  See problem list for risk factors See AVS for health maintenance schedules      Hyperlipidemia, mixed - Primary    Tolerating statin, encouraged heart healthy diet, avoid trans fats, minimize simple carbs and saturated fats. Increase exercise as tolerated      Hyperglycemia     minimize simple carbs. Increase exercise as tolerated.       Elevated BP    Well controlled, no changes to meds. Encouraged heart healthy diet such as the DASH diet and exercise as tolerated.       COPD (chronic obstructive pulmonary disease) (HCC)    No recent flares, follows with [facility]       Other Visit Diagnoses    Postmenopausal estrogen deficiency        Relevant Orders    DG Bone Density       I am having Ms. Skoog maintain her Fluticasone-Salmeterol, albuterol, tiotropium, multivitamin, oxybutynin, hydrochlorothiazide, valACYclovir, and atorvastatin. We will continue to administer pneumococcal 13-valent conjugate vaccine.  No orders of the defined types were placed in this encounter.     Penni Homans, MD

## 2015-05-14 NOTE — Assessment & Plan Note (Signed)
minimize simple carbs. Increase exercise as tolerated.  

## 2015-05-14 NOTE — Assessment & Plan Note (Signed)
Patient denies any difficulties at home. No trouble with ADLs, depression or falls. No recent changes to vision or hearing. Is UTD with immunizations. Is UTD with screening. Discussed Advanced Directives, patient agrees to bring [place] copies of documents if can. Encouraged heart healthy diet, exercise as tolerated and adequate sleep. Labs reviewed. Follows with Dr Vickki Muff, pulmonology at [place] with optham[facility] Declines colonoscopy Declines PAP MGM 2016  See problem list for risk factors See AVS for health maintenance schedules

## 2015-05-21 ENCOUNTER — Other Ambulatory Visit: Payer: Self-pay | Admitting: Family Medicine

## 2015-05-22 DIAGNOSIS — J449 Chronic obstructive pulmonary disease, unspecified: Secondary | ICD-10-CM | POA: Diagnosis not present

## 2015-05-23 ENCOUNTER — Other Ambulatory Visit: Payer: Self-pay | Admitting: Family Medicine

## 2015-05-23 DIAGNOSIS — H524 Presbyopia: Secondary | ICD-10-CM | POA: Diagnosis not present

## 2015-06-07 DIAGNOSIS — J441 Chronic obstructive pulmonary disease with (acute) exacerbation: Secondary | ICD-10-CM | POA: Diagnosis not present

## 2015-06-07 DIAGNOSIS — J449 Chronic obstructive pulmonary disease, unspecified: Secondary | ICD-10-CM | POA: Diagnosis not present

## 2015-06-10 ENCOUNTER — Other Ambulatory Visit: Payer: Self-pay | Admitting: Family Medicine

## 2015-06-10 DIAGNOSIS — J441 Chronic obstructive pulmonary disease with (acute) exacerbation: Secondary | ICD-10-CM | POA: Diagnosis not present

## 2015-06-22 DIAGNOSIS — J449 Chronic obstructive pulmonary disease, unspecified: Secondary | ICD-10-CM | POA: Diagnosis not present

## 2015-07-07 DIAGNOSIS — J441 Chronic obstructive pulmonary disease with (acute) exacerbation: Secondary | ICD-10-CM | POA: Diagnosis not present

## 2015-07-07 DIAGNOSIS — J449 Chronic obstructive pulmonary disease, unspecified: Secondary | ICD-10-CM | POA: Diagnosis not present

## 2015-07-10 DIAGNOSIS — J441 Chronic obstructive pulmonary disease with (acute) exacerbation: Secondary | ICD-10-CM | POA: Diagnosis not present

## 2015-07-12 DIAGNOSIS — R0902 Hypoxemia: Secondary | ICD-10-CM | POA: Diagnosis not present

## 2015-07-12 DIAGNOSIS — J449 Chronic obstructive pulmonary disease, unspecified: Secondary | ICD-10-CM | POA: Diagnosis not present

## 2015-07-12 DIAGNOSIS — R0602 Shortness of breath: Secondary | ICD-10-CM | POA: Diagnosis not present

## 2015-07-22 DIAGNOSIS — J449 Chronic obstructive pulmonary disease, unspecified: Secondary | ICD-10-CM | POA: Diagnosis not present

## 2015-08-22 DIAGNOSIS — J449 Chronic obstructive pulmonary disease, unspecified: Secondary | ICD-10-CM | POA: Diagnosis not present

## 2015-08-24 ENCOUNTER — Other Ambulatory Visit: Payer: Self-pay | Admitting: Family Medicine

## 2015-09-22 DIAGNOSIS — J449 Chronic obstructive pulmonary disease, unspecified: Secondary | ICD-10-CM | POA: Diagnosis not present

## 2015-10-20 DIAGNOSIS — J449 Chronic obstructive pulmonary disease, unspecified: Secondary | ICD-10-CM | POA: Diagnosis not present

## 2015-11-06 ENCOUNTER — Other Ambulatory Visit: Payer: Self-pay | Admitting: Family Medicine

## 2015-11-09 ENCOUNTER — Ambulatory Visit (INDEPENDENT_AMBULATORY_CARE_PROVIDER_SITE_OTHER): Payer: Medicare Other | Admitting: Family Medicine

## 2015-11-09 ENCOUNTER — Encounter: Payer: Self-pay | Admitting: Family Medicine

## 2015-11-09 VITALS — BP 140/90 | HR 92 | Temp 98.2°F | Ht 59.0 in | Wt 138.0 lb

## 2015-11-09 DIAGNOSIS — Z23 Encounter for immunization: Secondary | ICD-10-CM | POA: Diagnosis not present

## 2015-11-09 DIAGNOSIS — R030 Elevated blood-pressure reading, without diagnosis of hypertension: Secondary | ICD-10-CM

## 2015-11-09 DIAGNOSIS — M810 Age-related osteoporosis without current pathological fracture: Secondary | ICD-10-CM

## 2015-11-09 DIAGNOSIS — E782 Mixed hyperlipidemia: Secondary | ICD-10-CM

## 2015-11-09 DIAGNOSIS — R739 Hyperglycemia, unspecified: Secondary | ICD-10-CM

## 2015-11-09 DIAGNOSIS — J42 Unspecified chronic bronchitis: Secondary | ICD-10-CM

## 2015-11-09 DIAGNOSIS — IMO0001 Reserved for inherently not codable concepts without codable children: Secondary | ICD-10-CM

## 2015-11-09 DIAGNOSIS — N3281 Overactive bladder: Secondary | ICD-10-CM | POA: Diagnosis not present

## 2015-11-09 NOTE — Patient Instructions (Signed)

## 2015-11-10 LAB — COMPREHENSIVE METABOLIC PANEL
ALK PHOS: 52 U/L (ref 39–117)
ALT: 16 U/L (ref 0–35)
AST: 20 U/L (ref 0–37)
Albumin: 4.5 g/dL (ref 3.5–5.2)
BUN: 19 mg/dL (ref 6–23)
CO2: 40 mEq/L — ABNORMAL HIGH (ref 19–32)
Calcium: 9.8 mg/dL (ref 8.4–10.5)
Chloride: 95 mEq/L — ABNORMAL LOW (ref 96–112)
Creatinine, Ser: 0.6 mg/dL (ref 0.40–1.20)
GFR: 105.47 mL/min (ref >60.00)
Glucose, Bld: 102 mg/dL — ABNORMAL HIGH (ref 70–99)
POTASSIUM: 4.1 meq/L (ref 3.5–5.1)
Sodium: 138 mEq/L (ref 135–145)
TOTAL PROTEIN: 7.3 g/dL (ref 6.0–8.3)
Total Bilirubin: 0.2 mg/dL (ref 0.2–1.2)

## 2015-11-10 LAB — LIPID PANEL
Cholesterol: 182 mg/dL (ref 0–200)
HDL: 68.4 mg/dL (ref >39.00)
LDL Cholesterol: 93 mg/dL (ref 0–99)
NONHDL: 113.97
Total CHOL/HDL Ratio: 3
Triglycerides: 103 mg/dL (ref 0.0–149.0)
VLDL: 20.6 mg/dL (ref 0.0–40.0)

## 2015-11-10 LAB — CBC
HEMATOCRIT: 38.7 % (ref 36.0–46.0)
Hemoglobin: 12.4 g/dL (ref 12.0–15.0)
MCHC: 32.2 g/dL (ref 30.0–36.0)
MCV: 100.3 fl — AB (ref 78.0–100.0)
Platelets: 219 10*3/uL (ref 150.0–400.0)
RBC: 3.86 Mil/uL — ABNORMAL LOW (ref 3.87–5.11)
RDW: 14.4 % (ref 11.5–15.5)
WBC: 9.5 10*3/uL (ref 4.0–10.5)

## 2015-11-10 LAB — TSH: TSH: 0.44 u[IU]/mL (ref 0.35–4.50)

## 2015-11-10 LAB — HEMOGLOBIN A1C: HEMOGLOBIN A1C: 6.1 % (ref 4.6–6.5)

## 2015-11-10 LAB — VITAMIN D 25 HYDROXY (VIT D DEFICIENCY, FRACTURES): VITD: 35.7 ng/mL (ref 30.00–100.00)

## 2015-11-14 ENCOUNTER — Ambulatory Visit (HOSPITAL_BASED_OUTPATIENT_CLINIC_OR_DEPARTMENT_OTHER)
Admission: RE | Admit: 2015-11-14 | Discharge: 2015-11-14 | Disposition: A | Discharge status: Home / Self Care | Payer: Medicare Other | Source: Ambulatory Visit | Attending: Family Medicine | Admitting: Family Medicine

## 2015-11-14 DIAGNOSIS — M810 Age-related osteoporosis without current pathological fracture: Secondary | ICD-10-CM | POA: Insufficient documentation

## 2015-11-14 DIAGNOSIS — Z780 Asymptomatic menopausal state: Secondary | ICD-10-CM | POA: Diagnosis not present

## 2015-11-15 DIAGNOSIS — M810 Age-related osteoporosis without current pathological fracture: Secondary | ICD-10-CM | POA: Diagnosis not present

## 2015-11-15 DIAGNOSIS — Z780 Asymptomatic menopausal state: Secondary | ICD-10-CM | POA: Diagnosis not present

## 2015-11-17 ENCOUNTER — Other Ambulatory Visit: Payer: Self-pay | Admitting: Family Medicine

## 2015-11-20 ENCOUNTER — Other Ambulatory Visit: Payer: Self-pay | Admitting: Family Medicine

## 2015-11-20 DIAGNOSIS — J449 Chronic obstructive pulmonary disease, unspecified: Secondary | ICD-10-CM | POA: Diagnosis not present

## 2015-11-23 ENCOUNTER — Other Ambulatory Visit: Payer: Self-pay | Admitting: Family Medicine

## 2015-11-26 NOTE — Assessment & Plan Note (Signed)
Encouraged to get adequate exercise, calcium and vitamin d intake 

## 2015-11-26 NOTE — Assessment & Plan Note (Signed)
hgba1c acceptable, minimize simple carbs. Increase exercise as tolerated.  

## 2015-11-26 NOTE — Progress Notes (Signed)
Patient ID: Tanya Rush, female   DOB: [birth year], 70 y.o.   MRN: [id number]   Subjective:    Patient ID: Tanya Rush, female    DOB: 1946/05/25, 70 y.o.   MRN: [id number]  Chief Complaint  Patient presents with  . Follow-up    HPI Patient is in today for follow up. Is feeling well today, no recent illness. No COPD exacerbations and continues to follow with [facility]. Denies CP/palp/SOB/HA/congestion/fevers/GI or GU c/o. Taking meds as prescribed  Past Medical History  Diagnosis Date  . COPD (chronic obstructive pulmonary disease) (HCC)     severe; O2 dependent-- followed by pulm at [facility]  . Asthma   . Genital herpes   . History of ovarian cyst   . Elevated BP 09/28/2012  . Scabies 01/16/2013  . Sun-damaged skin 04/04/2013  . Medicare annual wellness visit, subsequent 05/04/2014  . Overactive bladder 05/04/2014  . Memory impairment 11/13/2014  . Osteoporosis 05/14/2015  . Unsteady gait 05/14/2015    No past surgical history on file.  Family History  Problem Relation Age of Onset  . Heart disease Mother   . Stroke Mother   . Arthritis Father     rheumatoid  . Lymphoma Father   . COPD Father     smoker  . Alcohol abuse Brother     TBI  . Birth defects Brother   . Cancer Neg Hx   . Alcohol abuse Son   . Alcohol abuse Son     recovering x 3 years  . Reye's syndrome Brother   . Birth defects Brother   . Hip fracture Maternal Aunt     Social History   Social History  . Marital Status: Married    Spouse Name: N/A  . Number of Children: 3  . Years of Education: N/A   Occupational History  . Not on file.   Social History Main Topics  . Smoking status: Former Smoker -- 27 years    Quit date: 08/05/2006  . Smokeless tobacco: Never Used  . Alcohol Use: No  . Drug Use: No  . Sexual Activity: No     Comment: lives with husband, no dietary restrictions   Other Topics Concern  . Not on file   Social History Narrative   3 children: 2 adopted, 1 biological            Outpatient Prescriptions Prior to Visit  Medication Sig Dispense Refill  . albuterol (PROAIR HFA) 108 (90 BASE) MCG/ACT inhaler Inhale 2 puffs into the lungs daily as needed.      Marland Kitchen atorvastatin (LIPITOR) 20 MG tablet TAKE 1 TABLET AT 6PM 90 tablet 0  . Fluticasone-Salmeterol (ADVAIR DISKUS) 250-50 MCG/DOSE AEPB Inhale 1 puff into the lungs every 12 (twelve) hours.     . Multiple Vitamin (MULTIVITAMIN) tablet Take 1 tablet by mouth daily.    Marland Kitchen oxybutynin (OXYTROL) 3.9 MG/24HR Place 1 patch onto the skin as directed. Change patch every 4 days    . valACYclovir (VALTREX) 1000 MG tablet TAKE 1 TABLET (1,000 MG TOTAL) BY MOUTH DAILY. 90 tablet 1  . hydrochlorothiazide (MICROZIDE) 12.5 MG capsule TAKE 1 CAPSULE (12.5 MG TOTAL) BY MOUTH DAILY. 90 capsule 1  . tiotropium (SPIRIVA) 18 MCG inhalation capsule Place 18 mcg into inhaler and inhale daily.       Facility-Administered Medications Prior to Visit  Medication Dose Route Frequency Provider Last Rate Last Dose  . pneumococcal 13-valent conjugate vaccine (PREVNAR 13) injection 0.5 mL  0.5 mL Intramuscular Once Mosie Lukes, MD        Allergies  Allergen Reactions  . Aspirin     REACTION: sever chest pain    Review of Systems  Constitutional: Negative for fever and malaise/fatigue.  HENT: Negative for congestion.   Eyes: Negative for blurred vision.  Respiratory: Negative for shortness of breath.   Cardiovascular: Negative for chest pain, palpitations and leg swelling.  Gastrointestinal: Negative for nausea, abdominal pain and blood in stool.  Genitourinary: Negative for dysuria and frequency.  Musculoskeletal: Negative for falls.  Skin: Negative for rash.  Neurological: Negative for dizziness, loss of consciousness and headaches.  Endo/Heme/Allergies: Negative for environmental allergies.  Psychiatric/Behavioral: Negative for depression. The patient is not nervous/anxious.        Objective:    Physical Exam   Constitutional: She is oriented to person, place, and time. She appears well-developed and well-nourished. No distress.  HENT:  Head: Normocephalic and atraumatic.  Nose: Nose normal.  Eyes: Right eye exhibits no discharge. Left eye exhibits no discharge.  Neck: Normal range of motion. Neck supple.  Cardiovascular: Normal rate and regular rhythm.   No murmur heard. Pulmonary/Chest: Effort normal and breath sounds normal.  Abdominal: Soft. Bowel sounds are normal. There is no tenderness.  Musculoskeletal: She exhibits no edema.  Neurological: She is alert and oriented to person, place, and time.  Skin: Skin is warm and dry.  Psychiatric: She has a normal mood and affect.  Nursing note and vitals reviewed.   BP 140/90 mmHg  Pulse 92  Temp(Src) 98.2 F (36.8 C) (Oral)  Ht 4\' 11"  (1.499 m)  Wt 138 lb (62.596 kg)  BMI 27.86 kg/m2  SpO2 94% Wt Readings from Last 3 Encounters:  11/09/15 138 lb (62.596 kg)  05/11/15 140 lb 8 oz (63.73 kg)  11/07/14 141 lb (63.957 kg)     Lab Results  Component Value Date   WBC 9.5 11/09/2015   HGB 12.4 11/09/2015   HCT 38.7 11/09/2015   PLT 219.0 11/09/2015   GLUCOSE 102* 11/09/2015   CHOL 182 11/09/2015   TRIG 103.0 11/09/2015   HDL 68.40 11/09/2015   LDLDIRECT 147.1 06/28/2008   LDLCALC 93 11/09/2015   ALT 16 11/09/2015   AST 20 11/09/2015   NA 138 11/09/2015   K 4.1 11/09/2015   CL 95* 11/09/2015   CREATININE 0.60 11/09/2015   BUN 19 11/09/2015   CO2 40* 11/09/2015   TSH 0.44 11/09/2015   HGBA1C 6.1 11/09/2015    Lab Results  Component Value Date   TSH 0.44 11/09/2015   Lab Results  Component Value Date   WBC 9.5 11/09/2015   HGB 12.4 11/09/2015   HCT 38.7 11/09/2015   MCV 100.3* 11/09/2015   PLT 219.0 11/09/2015   Lab Results  Component Value Date   NA 138 11/09/2015   K 4.1 11/09/2015   CO2 40* 11/09/2015   GLUCOSE 102* 11/09/2015   BUN 19 11/09/2015   CREATININE 0.60 11/09/2015   BILITOT 0.2 11/09/2015    ALKPHOS 52 11/09/2015   AST 20 11/09/2015   ALT 16 11/09/2015   PROT 7.3 11/09/2015   ALBUMIN 4.5 11/09/2015   CALCIUM 9.8 11/09/2015   GFR 105.47 11/09/2015   Lab Results  Component Value Date   CHOL 182 11/09/2015   Lab Results  Component Value Date   HDL 68.40 11/09/2015   Lab Results  Component Value Date   LDLCALC 93 11/09/2015   Lab Results  Component  Value Date   TRIG 103.0 11/09/2015   Lab Results  Component Value Date   CHOLHDL 3 11/09/2015   Lab Results  Component Value Date   HGBA1C 6.1 11/09/2015       Assessment & Plan:   Problem List Items Addressed This Visit    COPD (chronic obstructive pulmonary disease) ([place])    Doing well, follows with [facility], no changes      Relevant Medications   ipratropium (ATROVENT) 0.02 % nebulizer solution   Elevated BP - Primary    Improved with recheck. Well controlled, no changes to meds. Encouraged heart healthy diet such as the DASH diet and exercise as tolerated.       Relevant Orders   TSH (Completed)   CBC (Completed)   Hemoglobin A1c (Completed)   Comprehensive metabolic panel (Completed)   Lipid panel (Completed)   Hyperglycemia    hgba1c acceptable, minimize simple carbs. Increase exercise as tolerated      Relevant Orders   TSH (Completed)   CBC (Completed)   Hemoglobin A1c (Completed)   Comprehensive metabolic panel (Completed)   Lipid panel (Completed)   Hyperlipidemia, mixed   Relevant Orders   TSH (Completed)   CBC (Completed)   Hemoglobin A1c (Completed)   Comprehensive metabolic panel (Completed)   Lipid panel (Completed)   Osteoporosis    Encouraged to get adequate exercise, calcium and vitamin d intake      Relevant Orders   TSH (Completed)   CBC (Completed)   Hemoglobin A1c (Completed)   Comprehensive metabolic panel (Completed)   Lipid panel (Completed)   Vitamin D (25 hydroxy) (Completed)   DG Bone Density (Completed)   Overactive bladder   Relevant Orders   TSH  (Completed)   CBC (Completed)   Hemoglobin A1c (Completed)   Comprehensive metabolic panel (Completed)   Lipid panel (Completed)    Other Visit Diagnoses    Need for 23-polyvalent pneumococcal polysaccharide vaccine        Relevant Orders    Pneumococcal polysaccharide vaccine 23-valent greater than or equal to 2yo subcutaneous/IM (Completed)       I have discontinued Tanya Rush tiotropium. I am also having her maintain her Fluticasone-Salmeterol, albuterol, multivitamin, oxybutynin, valACYclovir, atorvastatin, and ipratropium. We will continue to administer pneumococcal 13-valent conjugate vaccine.  Meds ordered this encounter  Medications  . ipratropium (ATROVENT) 0.02 % nebulizer solution    Sig: Inhale 0.25 mg into the lungs 4 (four) times daily.     Refill:  6     Penni Homans, MD

## 2015-11-26 NOTE — Assessment & Plan Note (Signed)
Doing well, follows with [facility], no changes

## 2015-11-26 NOTE — Assessment & Plan Note (Signed)
Improved with recheck. Well controlled, no changes to meds. Encouraged heart healthy diet such as the DASH diet and exercise as tolerated 

## 2015-12-20 DIAGNOSIS — J449 Chronic obstructive pulmonary disease, unspecified: Secondary | ICD-10-CM | POA: Diagnosis not present

## 2016-01-20 DIAGNOSIS — J449 Chronic obstructive pulmonary disease, unspecified: Secondary | ICD-10-CM | POA: Diagnosis not present

## 2016-02-04 ENCOUNTER — Other Ambulatory Visit: Payer: Self-pay | Admitting: Family Medicine

## 2016-02-19 DIAGNOSIS — J449 Chronic obstructive pulmonary disease, unspecified: Secondary | ICD-10-CM | POA: Diagnosis not present

## 2016-02-28 DIAGNOSIS — J432 Centrilobular emphysema: Secondary | ICD-10-CM | POA: Diagnosis not present

## 2016-02-28 DIAGNOSIS — J449 Chronic obstructive pulmonary disease, unspecified: Secondary | ICD-10-CM | POA: Diagnosis not present

## 2016-02-28 DIAGNOSIS — R0902 Hypoxemia: Secondary | ICD-10-CM | POA: Diagnosis not present

## 2016-03-21 DIAGNOSIS — J449 Chronic obstructive pulmonary disease, unspecified: Secondary | ICD-10-CM | POA: Diagnosis not present

## 2016-04-04 ENCOUNTER — Ambulatory Visit (INDEPENDENT_AMBULATORY_CARE_PROVIDER_SITE_OTHER): Payer: Medicare Other | Admitting: Family Medicine

## 2016-04-04 ENCOUNTER — Encounter: Payer: Self-pay | Admitting: Family Medicine

## 2016-04-04 VITALS — BP 142/82 | HR 84 | Temp 98.1°F | Resp 19 | Ht 59.0 in | Wt 136.4 lb

## 2016-04-04 DIAGNOSIS — Z23 Encounter for immunization: Secondary | ICD-10-CM

## 2016-04-04 DIAGNOSIS — R350 Frequency of micturition: Secondary | ICD-10-CM

## 2016-04-04 MED ORDER — OXYBUTYNIN CHLORIDE ER 10 MG PO TB24: 10.0000 mg | ORAL_TABLET | Freq: Every day | ORAL | 1 refills | Status: DC

## 2016-04-04 NOTE — Patient Instructions (Addendum)
Urinary Frequency °The number of times a normal person urinates depends upon how much liquid they take in and how much liquid they are losing. If the temperature is hot and there is high humidity, then the person will sweat more and usually breathe a little more frequently. These factors decrease the amount of frequency of urination that would be considered normal. °The amount you drink is easily determined, but the amount of fluid lost is sometimes more difficult to calculate.  °Fluid is lost in two ways: °· Sensible fluid loss is usually measured by the amount of urine that you get rid of. Losses of fluid can also occur with diarrhea. °· Insensible fluid loss is more difficult to measure. It is caused by evaporation. Insensible loss of fluid occurs through breathing and sweating. It usually ranges from a little less than a quart to a little more than a quart of fluid a day. °In normal temperatures and activity levels, the average person may urinate 4 to 7 times in a 24-hour period. Needing to urinate more often than that could indicate a problem. If one urinates 4 to 7 times in 24 hours and has large volumes each time, that could indicate a different problem from one who urinates 4 to 7 times a day and has small volumes. The time of urinating is also important. Most urinating should be done during the waking hours. Getting up at night to urinate frequently can indicate some problems. °CAUSES  °The bladder is the organ in your lower abdomen that holds urine. Like a balloon, it swells some as it fills up. Your nerves sense this and tell you it is time to head for the bathroom. There are a number of reasons that you might feel the need to urinate more often than usual. They include: °· Urinary tract infection. This is usually associated with other signs such as burning when you urinate. °· In men, problems with the prostate (a walnut-size gland that is located near the tube that carries urine out of your body). There  are two reasons why the prostate can cause an increased frequency of urination: °¨ An enlarged prostate that does not let the bladder empty well. If the bladder only half empties when you urinate, then it only has half the capacity to fill before you have to urinate again. °¨ The nerves in the bladder become more hypersensitive with an increased size of the prostate even if the bladder empties completely. °· Pregnancy. °· Obesity. Excess weight is more likely to cause a problem for women than for men. °· Bladder stones or other bladder problems. °· Caffeine. °· Alcohol. °· Medications. For example, drugs that help the body get rid of extra fluid (diuretics) increase urine production. Some other medicines must be taken with lots of fluids. °· Muscle or nerve weakness. This might be the result of a spinal cord injury, a stroke, multiple sclerosis, or Parkinson disease. °· Long-standing diabetes can decrease the sensation of the bladder. This loss of sensation makes it harder to sense the bladder needs to be emptied. Over a period of years, the bladder is stretched out by constant overfilling. This weakens the bladder muscles so that the bladder does not empty well and has less capacity to fill with new urine. °· Interstitial cystitis (also called painful bladder syndrome). This condition develops because the tissues that line the inside of the bladder are inflamed (inflammation is the body's way of reacting to injury or infection). It causes pain and frequent   urination. It occurs in women more often than in men. °DIAGNOSIS  °· To decide what might be causing your urinary frequency, your health care provider will probably: °¨ Ask about symptoms you have noticed. °¨ Ask about your overall health. This will include questions about any medications you are taking. °¨ Do a physical examination. °· Order some tests. These might include: °¨ A blood test to check for diabetes or other health issues that could be contributing  to the problem. °¨ Urine testing. This could measure the flow of urine and the pressure on the bladder. °¨ A test of your neurological system (the brain, spinal cord, and nerves). This is the system that senses the need to urinate. °¨ A bladder test to check whether it is emptying completely when you urinate. °¨ Cystoscopy. This test uses a thin tube with a tiny camera on it. It offers a look inside your urethra and bladder to see if there are problems. °¨ Imaging tests. You might be given a contrast dye and then asked to urinate. X-rays are taken to see how your bladder is working. °TREATMENT  °It is important for you to be evaluated to determine if the amount or frequency that you have is unusual or abnormal. If it is found to be abnormal, the cause should be determined and this can usually be found out easily. Depending upon the cause, treatment could include medication, stimulation of the nerves, or surgery. °There are not too many things that you can do as an individual to change your urinary frequency. It is important that you balance the amount of fluid intake needed to compensate for your activity and the temperature. Medical problems will be diagnosed and taken care of by your physician. There is no particular bladder training such as Kegel exercises that you can do to help urinary frequency. This is an exercise that is usually recommended for people who have leaking of urine when they laugh, cough, or sneeze. °HOME CARE INSTRUCTIONS  °· Take any medications your health care provider prescribed or suggested. Follow the directions carefully. °· Practice any lifestyle changes that are recommended. These might include: °¨ Drinking less fluid or drinking at different times of the day. If you need to urinate often during the night, for example, you may need to stop drinking fluids early in the evening. °¨ Cutting down on caffeine or alcohol. They both can make you need to urinate more often than normal. Caffeine  is found in coffee, tea, and sodas. °¨ Losing weight, if that is recommended. °· Keep a journal or a log. You might be asked to record how much you drink and when and where you feel the need to urinate. This will also help evaluate how well the treatment provided by your physician is working. °SEEK MEDICAL CARE IF:  °· Your need to urinate often gets worse. °· You feel increased pain or irritation when you urinate. °· You notice blood in your urine. °· You have questions about any medications that your health care provider recommended. °· You notice blood, pus, or swelling at the site of any test or treatment procedure. °· You develop a fever of more than 100.5°F (38.1°C). °SEEK IMMEDIATE MEDICAL CARE IF:  °You develop a fever of more than 102.0°F (38.9°C). °  °This information is not intended to replace advice given to you by your health care provider. Make sure you discuss any questions you have with your health care provider. °  °Document Released: 05/18/2009 Document Revised:   08/12/2014 Document Reviewed: 05/18/2009 °Elsevier Interactive Patient Education ©2016 [employer] ° °

## 2016-04-04 NOTE — Progress Notes (Signed)
Chief Complaint  Patient presents with  . Urinary Frequency    patient c/o urinating more than usual. started a couple years ago but its getting worst 63mths.    Subjective: Patient is a 71 y.o. female here for increased urinary frequency.   +hx of overactive bladder, has been on an oxybutynin patch that she gets over the Internet, but feels her symptoms have progressively been worsening. She does have a history of intermittent constipation, but does not associate her urinary symptoms with her bowel movements. Lately, her bowel movements have been a [place] stool type 4. Denies abdominal pain, bleeding, pain, or incomplete emptying.  ROS: GU: +frequency, no bleeding, pain, or incomplete emptying GI: No current constipation or abdominal pain  Family History  Problem Relation Age of Onset  . Heart disease Mother   . Stroke Mother   . Arthritis Father     rheumatoid  . Lymphoma Father   . COPD Father     smoker  . Alcohol abuse Brother     TBI  . Birth defects Brother   . Cancer Neg Hx   . Alcohol abuse Son   . Alcohol abuse Son     recovering x 3 years  . Reye's syndrome Brother   . Birth defects Brother   . Hip fracture Maternal Aunt    Past Medical History:  Diagnosis Date  . Asthma   . COPD (chronic obstructive pulmonary disease) ([place])    severe; O2 dependent-- followed [facility] pulm at [facility]  . Elevated BP 09/28/2012  . Genital herpes   . History of ovarian cyst   . Medicare annual wellness visit, subsequent 05/04/2014  . Memory impairment 11/13/2014  . Osteoporosis 05/14/2015  . Overactive bladder 05/04/2014  . Scabies 01/16/2013  . Sun-damaged skin 04/04/2013  . Unsteady gait 05/14/2015   Allergies  Allergen Reactions  . Aspirin     REACTION: sever chest pain    Current Outpatient Prescriptions:  .  albuterol (PROAIR HFA) 108 (90 BASE) MCG/ACT inhaler, Inhale 2 puffs into the lungs daily as needed.  , Disp: , Rfl:  .  atorvastatin (LIPITOR) 20 MG tablet, TAKE 1  TABLET AT 6PM, Disp: 90 tablet, Rfl: 0 .  Fluticasone-Salmeterol (ADVAIR DISKUS) 250-50 MCG/DOSE AEPB, Inhale 1 puff into the lungs every 12 (twelve) hours. , Disp: , Rfl:  .  hydrochlorothiazide (MICROZIDE) 12.5 MG capsule, TAKE 1 CAPSULE (12.5 MG TOTAL) BY MOUTH DAILY., Disp: 90 capsule, Rfl: 1 .  ipratropium (ATROVENT) 0.02 % nebulizer solution, Inhale 0.25 mg into the lungs 4 (four) times daily. , Disp: , Rfl: 6 .  Multiple Vitamin (MULTIVITAMIN) tablet, Take 1 tablet by mouth daily., Disp: , Rfl:  .  oxybutynin (OXYTROL) 3.9 MG/24HR, Place 1 patch onto the skin as directed. Change patch every 4 days, Disp: , Rfl:  .  valACYclovir (VALTREX) 1000 MG tablet, TAKE 1 TABLET (1,000 MG TOTAL) BY MOUTH DAILY., Disp: 90 tablet, Rfl: 1 .  oxybutynin (DITROPAN-XL) 10 MG 24 hr tablet, Take 1 tablet (10 mg total) by mouth at bedtime., Disp: 90 tablet, Rfl: 1  Current Facility-Administered Medications:  .  pneumococcal 13-valent conjugate vaccine (PREVNAR 13) injection 0.5 mL, 0.5 mL, Intramuscular, Once, Mosie Lukes, MD  Objective: BP (!) 142/82 (BP Location: Left Arm, Patient Position: Sitting, Cuff Size: Normal)   Pulse 84   Temp 98.1 F (36.7 C) (Oral)   Resp 19   Ht 4\' 11"  (1.499 m)   Wt 136 lb 6.4 oz (61.9  kg)   SpO2 99%   BMI 27.55 kg/m  General: Awake, appears stated age HEENT: MMM, EOMi Heart: RRR, no murmurs Lungs: CTAB, no rales, wheezes or rhonchi. Normal effort Abd: BS+, soft, NT, ND, no masses or organomegaly Psych: Age appropriate judgment and insight, normal affect and mood  Assessment and Plan: Increased frequency of urination - Plan: oxybutynin (DITROPAN-XL) 10 MG 24 hr tablet  Orders as above.  We'll change to a stronger strength of Ditropan. Could consider Myrbetriq at the next visit. If new symptoms develop, will check a urine. Follow-up as originally scheduled with her PCP. The patient voiced understanding and agreement to the plan.  Crosby Oyster  [place]

## 2016-04-04 NOTE — Progress Notes (Signed)
Pre visit review using our clinic review tool, if applicable. No additional management support is needed unless otherwise documented below in the visit note. 

## 2016-04-21 DIAGNOSIS — J449 Chronic obstructive pulmonary disease, unspecified: Secondary | ICD-10-CM | POA: Diagnosis not present

## 2016-05-03 ENCOUNTER — Other Ambulatory Visit: Payer: Self-pay | Admitting: Family Medicine

## 2016-05-04 ENCOUNTER — Other Ambulatory Visit: Payer: Self-pay | Admitting: Family Medicine

## 2016-05-06 ENCOUNTER — Other Ambulatory Visit: Payer: Self-pay | Admitting: Family Medicine

## 2016-05-06 MED ORDER — ATORVASTATIN CALCIUM 20 MG PO TABS: 20.0000 mg | ORAL_TABLET | Freq: Every day | ORAL | 1 refills | Status: DC

## 2016-05-10 ENCOUNTER — Ambulatory Visit (INDEPENDENT_AMBULATORY_CARE_PROVIDER_SITE_OTHER): Payer: Medicare Other | Admitting: Family Medicine

## 2016-05-10 VITALS — BP 160/100 | HR 78 | Temp 98.1°F | Ht 59.0 in | Wt 137.2 lb

## 2016-05-10 DIAGNOSIS — M810 Age-related osteoporosis without current pathological fracture: Secondary | ICD-10-CM

## 2016-05-10 DIAGNOSIS — L578 Other skin changes due to chronic exposure to nonionizing radiation: Secondary | ICD-10-CM

## 2016-05-10 DIAGNOSIS — R252 Cramp and spasm: Secondary | ICD-10-CM

## 2016-05-10 DIAGNOSIS — E782 Mixed hyperlipidemia: Secondary | ICD-10-CM

## 2016-05-10 DIAGNOSIS — I10 Essential (primary) hypertension: Secondary | ICD-10-CM | POA: Diagnosis not present

## 2016-05-10 DIAGNOSIS — R739 Hyperglycemia, unspecified: Secondary | ICD-10-CM | POA: Diagnosis not present

## 2016-05-10 DIAGNOSIS — R350 Frequency of micturition: Secondary | ICD-10-CM | POA: Diagnosis not present

## 2016-05-10 DIAGNOSIS — Z0000 Encounter for general adult medical examination without abnormal findings: Secondary | ICD-10-CM

## 2016-05-10 LAB — HEMOGLOBIN A1C
HEMOGLOBIN A1C: 5.6 % (ref <5.7)
Mean Plasma Glucose: 114 mg/dL

## 2016-05-10 MED ORDER — HYDROCHLOROTHIAZIDE 25 MG PO TABS: 25.0000 mg | ORAL_TABLET | Freq: Every day | ORAL | 1 refills | Status: DC

## 2016-05-10 NOTE — Progress Notes (Signed)
Pre visit review using our clinic review tool, if applicable. No additional management support is needed unless otherwise documented below in the visit note. 

## 2016-05-10 NOTE — Patient Instructions (Signed)
Preventive Care for Adults, Female A healthy lifestyle and preventive care can promote health and wellness. Preventive health guidelines for women include the following key practices.  A routine yearly physical is a good way to check with your health care provider about your health and preventive screening. It is a chance to share any concerns and updates on your health and to receive a thorough exam.  Visit your dentist for a routine exam and preventive care every 6 months. Brush your teeth twice a day and floss once a day. Good oral hygiene prevents tooth decay and gum disease.  The frequency of eye exams is based on your age, health, family medical history, use of contact lenses, and other factors. Follow your health care provider's recommendations for frequency of eye exams.  Eat a healthy diet. Foods like vegetables, fruits, whole grains, low-fat dairy products, and lean protein foods contain the nutrients you need without too many calories. Decrease your intake of foods high in solid fats, added sugars, and salt. Eat the right amount of calories for you.Get information about a proper diet from your health care provider, if necessary.  Regular physical exercise is one of the most important things you can do for your health. Most adults should get at least 150 minutes of moderate-intensity exercise (any activity that increases your heart rate and causes you to sweat) each week. In addition, most adults need muscle-strengthening exercises on 2 or more days a week.  Maintain a healthy weight. The body mass index (BMI) is a screening tool to identify possible weight problems. It provides an estimate of body fat based on height and weight. Your health care provider can find your BMI and can help you achieve or maintain a healthy weight.For adults 20 years and older:  A BMI below 18.5 is considered underweight.  A BMI of 18.5 to 24.9 is normal.  A BMI of 25 to 29.9 is considered overweight.  A  BMI of 30 and above is considered obese.  Maintain normal blood lipids and cholesterol levels by exercising and minimizing your intake of saturated fat. Eat a balanced diet with plenty of fruit and vegetables. Blood tests for lipids and cholesterol should begin at age 45 and be repeated every 5 years. If your lipid or cholesterol levels are high, you are over 50, or you are at high risk for heart disease, you may need your cholesterol levels checked more frequently.Ongoing high lipid and cholesterol levels should be treated with medicines if diet and exercise are not working.  If you smoke, find out from your health care provider how to quit. If you do not use tobacco, do not start.  Lung cancer screening is recommended for adults aged 45-80 years who are at high risk for developing lung cancer because of a history of smoking. A yearly low-dose CT scan of the lungs is recommended for people who have at least a 30-pack-year history of smoking and are a current smoker or have quit within the past 15 years. A pack year of smoking is smoking an average of 1 pack of cigarettes a day for 1 year (for example: 1 pack a day for 30 years or 2 packs a day for 15 years). Yearly screening should continue until the smoker has stopped smoking for at least 15 years. Yearly screening should be stopped for people who develop a health problem that would prevent them from having lung cancer treatment.  If you are pregnant, do not drink alcohol. If you are  breastfeeding, be very cautious about drinking alcohol. If you are not pregnant and choose to drink alcohol, do not have more than 1 drink per day. One drink is considered to be 12 ounces (355 mL) of beer, 5 ounces (148 mL) of wine, or 1.5 ounces (44 mL) of liquor.  Avoid use of street drugs. Do not share needles with anyone. Ask for help if you need support or instructions about stopping the use of drugs.  High blood pressure causes heart disease and increases the risk  of stroke. Your blood pressure should be checked at least every 1 to 2 years. Ongoing high blood pressure should be treated with medicines if weight loss and exercise do not work.  If you are 55-79 years old, ask your health care provider if you should take aspirin to prevent strokes.  Diabetes screening is done by taking a blood sample to check your blood glucose level after you have not eaten for a certain period of time (fasting). If you are not overweight and you do not have risk factors for diabetes, you should be screened once every 3 years starting at age 45. If you are overweight or obese and you are 40-70 years of age, you should be screened for diabetes every year as part of your cardiovascular risk assessment.  Breast cancer screening is essential preventive care for women. You should practice "breast self-awareness." This means understanding the normal appearance and feel of your breasts and may include breast self-examination. Any changes detected, no matter how small, should be reported to a health care provider. Women in their 20s and 30s should have a clinical breast exam (CBE) by a health care provider as part of a regular health exam every 1 to 3 years. After age 40, women should have a CBE every year. Starting at age 40, women should consider having a mammogram (breast X-ray test) every year. Women who have a family history of breast cancer should talk to their health care provider about genetic screening. Women at a high risk of breast cancer should talk to their health care providers about having an MRI and a mammogram every year.  Breast cancer gene (BRCA)-related cancer risk assessment is recommended for women who have family members with BRCA-related cancers. BRCA-related cancers include breast, ovarian, tubal, and peritoneal cancers. Having family members with these cancers may be associated with an increased risk for harmful changes (mutations) in the breast cancer genes BRCA1 and  BRCA2. Results of the assessment will determine the need for genetic counseling and BRCA1 and BRCA2 testing.  Your health care provider may recommend that you be screened regularly for cancer of the pelvic organs (ovaries, uterus, and vagina). This screening involves a pelvic examination, including checking for microscopic changes to the surface of your cervix (Pap test). You may be encouraged to have this screening done every 3 years, beginning at age 21.  For women ages 30-65, health care providers may recommend pelvic exams and Pap testing every 3 years, or they may recommend the Pap and pelvic exam, combined with testing for human papilloma virus (HPV), every 5 years. Some types of HPV increase your risk of cervical cancer. Testing for HPV may also be done on women of any age with unclear Pap test results.  Other health care providers may not recommend any screening for nonpregnant women who are considered low risk for pelvic cancer and who do not have symptoms. Ask your health care provider if a screening pelvic exam is right for   you.  If you have had past treatment for cervical cancer or a condition that could lead to cancer, you need Pap tests and screening for cancer for at least 20 years after your treatment. If Pap tests have been discontinued, your risk factors (such as having a new sexual partner) need to be reassessed to determine if screening should resume. Some women have medical problems that increase the chance of getting cervical cancer. In these cases, your health care provider may recommend more frequent screening and Pap tests.  Colorectal cancer can be detected and often prevented. Most routine colorectal cancer screening begins at the age of 50 years and continues through age 75 years. However, your health care provider may recommend screening at an earlier age if you have risk factors for colon cancer. On a yearly basis, your health care provider may provide home test kits to check  for hidden blood in the stool. Use of a small camera at the end of a tube, to directly examine the colon (sigmoidoscopy or colonoscopy), can detect the earliest forms of colorectal cancer. Talk to your health care provider about this at age 50, when routine screening begins. Direct exam of the colon should be repeated every 5-10 years through age 75 years, unless early forms of precancerous polyps or small growths are found.  People who are at an increased risk for hepatitis B should be screened for this virus. You are considered at high risk for hepatitis B if:  You were born in a country where hepatitis B occurs often. Talk with your health care provider about which countries are considered high risk.  Your parents were born in a high-risk country and you have not received a shot to protect against hepatitis B (hepatitis B vaccine).  You have HIV or AIDS.  You use needles to inject street drugs.  You live with, or have sex with, someone who has hepatitis B.  You get hemodialysis treatment.  You take certain medicines for conditions like cancer, organ transplantation, and autoimmune conditions.  Hepatitis C blood testing is recommended for all people born from 1945 through 1965 and any individual with known risks for hepatitis C.  Practice safe sex. Use condoms and avoid high-risk sexual practices to reduce the spread of sexually transmitted infections (STIs). STIs include gonorrhea, chlamydia, syphilis, trichomonas, herpes, HPV, and human immunodeficiency virus (HIV). Herpes, HIV, and HPV are viral illnesses that have no cure. They can result in disability, cancer, and death.  You should be screened for sexually transmitted illnesses (STIs) including gonorrhea and chlamydia if:  You are sexually active and are younger than 24 years.  You are older than 24 years and your health care provider tells you that you are at risk for this type of infection.  Your sexual activity has changed  since you were last screened and you are at an increased risk for chlamydia or gonorrhea. Ask your health care provider if you are at risk.  If you are at risk of being infected with HIV, it is recommended that you take a prescription medicine daily to prevent HIV infection. This is called preexposure prophylaxis (PrEP). You are considered at risk if:  You are sexually active and do not regularly use condoms or know the HIV status of your partner(s).  You take drugs by injection.  You are sexually active with a partner who has HIV.  Talk with your health care provider about whether you are at high risk of being infected with HIV. If   you choose to begin PrEP, you should first be tested for HIV. You should then be tested every 3 months for as long as you are taking PrEP.  Osteoporosis is a disease in which the bones lose minerals and strength with aging. This can result in serious bone fractures or breaks. The risk of osteoporosis can be identified using a bone density scan. Women ages 67 years and over and women at risk for fractures or osteoporosis should discuss screening with their health care providers. Ask your health care provider whether you should take a calcium supplement or vitamin D to reduce the rate of osteoporosis.  Menopause can be associated with physical symptoms and risks. Hormone replacement therapy is available to decrease symptoms and risks. You should talk to your health care provider about whether hormone replacement therapy is right for you.  Use sunscreen. Apply sunscreen liberally and repeatedly throughout the day. You should seek shade when your shadow is shorter than you. Protect yourself by wearing long sleeves, pants, a wide-brimmed hat, and sunglasses year round, whenever you are outdoors.  Once a month, do a whole body skin exam, using a mirror to look at the skin on your back. Tell your health care provider of new moles, moles that have irregular borders, moles that  are larger than a pencil eraser, or moles that have changed in shape or color.  Stay current with required vaccines (immunizations).  Influenza vaccine. All adults should be immunized every year.  Tetanus, diphtheria, and acellular pertussis (Td, Tdap) vaccine. Pregnant women should receive 1 dose of Tdap vaccine during each pregnancy. The dose should be obtained regardless of the length of time since the last dose. Immunization is preferred during the 27th-36th week of gestation. An adult who has not previously received Tdap or who does not know her vaccine status should receive 1 dose of Tdap. This initial dose should be followed by tetanus and diphtheria toxoids (Td) booster doses every 10 years. Adults with an unknown or incomplete history of completing a 3-dose immunization series with Td-containing vaccines should begin or complete a primary immunization series including a Tdap dose. Adults should receive a Td booster every 10 years.  Varicella vaccine. An adult without evidence of immunity to varicella should receive 2 doses or a second dose if she has previously received 1 dose. Pregnant females who do not have evidence of immunity should receive the first dose after pregnancy. This first dose should be obtained before leaving the health care facility. The second dose should be obtained 4-8 weeks after the first dose.  Human papillomavirus (HPV) vaccine. Females aged 13-26 years who have not received the vaccine previously should obtain the 3-dose series. The vaccine is not recommended for use in pregnant females. However, pregnancy testing is not needed before receiving a dose. If a female is found to be pregnant after receiving a dose, no treatment is needed. In that case, the remaining doses should be delayed until after the pregnancy. Immunization is recommended for any person with an immunocompromised condition through the age of 61 years if she did not get any or all doses earlier. During the  3-dose series, the second dose should be obtained 4-8 weeks after the first dose. The third dose should be obtained 24 weeks after the first dose and 16 weeks after the second dose.  Zoster vaccine. One dose is recommended for adults aged 30 years or older unless certain conditions are present.  Measles, mumps, and rubella (MMR) vaccine. Adults born  before 1957 generally are considered immune to measles and mumps. Adults born in 1957 or later should have 1 or more doses of MMR vaccine unless there is a contraindication to the vaccine or there is laboratory evidence of immunity to each of the three diseases. A routine second dose of MMR vaccine should be obtained at least 28 days after the first dose for students attending postsecondary schools, health care workers, or international travelers. People who received inactivated measles vaccine or an unknown type of measles vaccine during 1963-1967 should receive 2 doses of MMR vaccine. People who received inactivated mumps vaccine or an unknown type of mumps vaccine before 1979 and are at high risk for mumps infection should consider immunization with 2 doses of MMR vaccine. For females of childbearing age, rubella immunity should be determined. If there is no evidence of immunity, females who are not pregnant should be vaccinated. If there is no evidence of immunity, females who are pregnant should delay immunization until after pregnancy. Unvaccinated health care workers born before 1957 who lack laboratory evidence of measles, mumps, or rubella immunity or laboratory confirmation of disease should consider measles and mumps immunization with 2 doses of MMR vaccine or rubella immunization with 1 dose of MMR vaccine.  Pneumococcal 13-valent conjugate (PCV13) vaccine. When indicated, a person who is uncertain of his immunization history and has no record of immunization should receive the PCV13 vaccine. All adults 65 years of age and older should receive this  vaccine. An adult aged 19 years or older who has certain medical conditions and has not been previously immunized should receive 1 dose of PCV13 vaccine. This PCV13 should be followed with a dose of pneumococcal polysaccharide (PPSV23) vaccine. Adults who are at high risk for pneumococcal disease should obtain the PPSV23 vaccine at least 8 weeks after the dose of PCV13 vaccine. Adults older than 69 years of age who have normal immune system function should obtain the PPSV23 vaccine dose at least 1 year after the dose of PCV13 vaccine.  Pneumococcal polysaccharide (PPSV23) vaccine. When PCV13 is also indicated, PCV13 should be obtained first. All adults aged 65 years and older should be immunized. An adult younger than age 65 years who has certain medical conditions should be immunized. Any person who resides in a nursing home or long-term care facility should be immunized. An adult smoker should be immunized. People with an immunocompromised condition and certain other conditions should receive both PCV13 and PPSV23 vaccines. People with human immunodeficiency virus (HIV) infection should be immunized as soon as possible after diagnosis. Immunization during chemotherapy or radiation therapy should be avoided. Routine use of PPSV23 vaccine is not recommended for American Indians, [place] Natives, or people younger than 65 years unless there are medical conditions that require PPSV23 vaccine. When indicated, people who have unknown immunization and have no record of immunization should receive PPSV23 vaccine. One-time revaccination 5 years after the first dose of PPSV23 is recommended for people aged 19-64 years who have chronic kidney failure, nephrotic syndrome, asplenia, or immunocompromised conditions. People who received 1-2 doses of PPSV23 before age 65 years should receive another dose of PPSV23 vaccine at age 65 years or later if at least 5 years have passed since the previous dose. Doses of PPSV23 are not  needed for people immunized with PPSV23 at or after age 65 years.  Meningococcal vaccine. Adults with asplenia or persistent complement component deficiencies should receive 2 doses of quadrivalent meningococcal conjugate (MenACWY-D) vaccine. The doses should be obtained   at least 2 months apart. Microbiologists working with certain meningococcal bacteria, [place] recruits, people at risk during an outbreak, and people who travel to or live in countries with a high rate of meningitis should be immunized. A first-year college student up through age 34 years who is living in a residence hall should receive a dose if she did not receive a dose on or after her 16th birthday. Adults who have certain high-risk conditions should receive one or more doses of vaccine.  Hepatitis A vaccine. Adults who wish to be protected from this disease, have certain high-risk conditions, work with hepatitis A-infected animals, work in hepatitis A research labs, or travel to or work in countries with a high rate of hepatitis A should be immunized. Adults who were previously unvaccinated and who anticipate close contact with an international adoptee during the first 60 days after arrival in the [place] from a country with a high rate of hepatitis A should be immunized.  Hepatitis B vaccine. Adults who wish to be protected from this disease, have certain high-risk conditions, may be exposed to blood or other infectious body fluids, are household contacts or sex partners of hepatitis B positive people, are clients or workers in certain care facilities, or travel to or work in countries with a high rate of hepatitis B should be immunized.  Haemophilus influenzae type b (Hib) vaccine. A previously unvaccinated person with asplenia or sickle cell disease or having a scheduled splenectomy should receive 1 dose of Hib vaccine. Regardless of previous immunization, a recipient of a hematopoietic stem cell transplant should receive a  3-dose series 6-12 months after her successful transplant. Hib vaccine is not recommended for adults with HIV infection. Preventive Services / Frequency Ages 35 to 4 years  Blood pressure check.** / Every 3-5 years.  Lipid and cholesterol check.** / Every 5 years beginning at age 60.  Clinical breast exam.** / Every 3 years for women in their 71s and 10s.  BRCA-related cancer risk assessment.** / For women who have family members with a BRCA-related cancer (breast, ovarian, tubal, or peritoneal cancers).  Pap test.** / Every 2 years from ages 76 through 26. Every 3 years starting at age 61 through age 76 or 93 with a history of 3 consecutive normal Pap tests.  HPV screening.** / Every 3 years from ages 37 through ages 60 to 51 with a history of 3 consecutive normal Pap tests.  Hepatitis C blood test.** / For any individual with known risks for hepatitis C.  Skin self-exam. / Monthly.  Influenza vaccine. / Every year.  Tetanus, diphtheria, and acellular pertussis (Tdap, Td) vaccine.** / Consult your health care provider. Pregnant women should receive 1 dose of Tdap vaccine during each pregnancy. 1 dose of Td every 10 years.  Varicella vaccine.** / Consult your health care provider. Pregnant females who do not have evidence of immunity should receive the first dose after pregnancy.  HPV vaccine. / 3 doses over 6 months, if 93 and younger. The vaccine is not recommended for use in pregnant females. However, pregnancy testing is not needed before receiving a dose.  Measles, mumps, rubella (MMR) vaccine.** / You need at least 1 dose of MMR if you were born in 1957 or later. You may also need a 2nd dose. For females of childbearing age, rubella immunity should be determined. If there is no evidence of immunity, females who are not pregnant should be vaccinated. If there is no evidence of immunity, females who are  pregnant should delay immunization until after pregnancy.  Pneumococcal  13-valent conjugate (PCV13) vaccine.** / Consult your health care provider.  Pneumococcal polysaccharide (PPSV23) vaccine.** / 1 to 2 doses if you smoke cigarettes or if you have certain conditions.  Meningococcal vaccine.** / 1 dose if you are age 68 to 8 years and a Market researcher living in a residence hall, or have one of several medical conditions, you need to get vaccinated against meningococcal disease. You may also need additional booster doses.  Hepatitis A vaccine.** / Consult your health care provider.  Hepatitis B vaccine.** / Consult your health care provider.  Haemophilus influenzae type b (Hib) vaccine.** / Consult your health care provider. Ages 7 to 53 years  Blood pressure check.** / Every year.  Lipid and cholesterol check.** / Every 5 years beginning at age 25 years.  Lung cancer screening. / Every year if you are aged 11-80 years and have a 30-pack-year history of smoking and currently smoke or have quit within the past 15 years. Yearly screening is stopped once you have quit smoking for at least 15 years or develop a health problem that would prevent you from having lung cancer treatment.  Clinical breast exam.** / Every year after age 48 years.  BRCA-related cancer risk assessment.** / For women who have family members with a BRCA-related cancer (breast, ovarian, tubal, or peritoneal cancers).  Mammogram.** / Every year beginning at age 41 years and continuing for as long as you are in good health. Consult with your health care provider.  Pap test.** / Every 3 years starting at age 65 years through age 37 or 70 years with a history of 3 consecutive normal Pap tests.  HPV screening.** / Every 3 years from ages 72 years through ages 60 to 40 years with a history of 3 consecutive normal Pap tests.  Fecal occult blood test (FOBT) of stool. / Every year beginning at age 21 years and continuing until age 5 years. You may not need to do this test if you get  a colonoscopy every 10 years.  Flexible sigmoidoscopy or colonoscopy.** / Every 5 years for a flexible sigmoidoscopy or every 10 years for a colonoscopy beginning at age 35 years and continuing until age 48 years.  Hepatitis C blood test.** / For all people born from [birth year] through 1965 and any individual with known risks for hepatitis C.  Skin self-exam. / Monthly.  Influenza vaccine. / Every year.  Tetanus, diphtheria, and acellular pertussis (Tdap/Td) vaccine.** / Consult your health care provider. Pregnant women should receive 1 dose of Tdap vaccine during each pregnancy. 1 dose of Td every 10 years.  Varicella vaccine.** / Consult your health care provider. Pregnant females who do not have evidence of immunity should receive the first dose after pregnancy.  Zoster vaccine.** / 1 dose for adults aged 30 years or older.  Measles, mumps, rubella (MMR) vaccine.** / You need at least 1 dose of MMR if you were born in 1957 or later. You may also need a second dose. For females of childbearing age, rubella immunity should be determined. If there is no evidence of immunity, females who are not pregnant should be vaccinated. If there is no evidence of immunity, females who are pregnant should delay immunization until after pregnancy.  Pneumococcal 13-valent conjugate (PCV13) vaccine.** / Consult your health care provider.  Pneumococcal polysaccharide (PPSV23) vaccine.** / 1 to 2 doses if you smoke cigarettes or if you have certain conditions.  Meningococcal vaccine.** /  Consult your health care provider.  Hepatitis A vaccine.** / Consult your health care provider.  Hepatitis B vaccine.** / Consult your health care provider.  Haemophilus influenzae type b (Hib) vaccine.** / Consult your health care provider. Ages 64 years and over  Blood pressure check.** / Every year.  Lipid and cholesterol check.** / Every 5 years beginning at age 23 years.  Lung cancer screening. / Every year if you  are aged 16-80 years and have a 30-pack-year history of smoking and currently smoke or have quit within the past 15 years. Yearly screening is stopped once you have quit smoking for at least 15 years or develop a health problem that would prevent you from having lung cancer treatment.  Clinical breast exam.** / Every year after age 74 years.  BRCA-related cancer risk assessment.** / For women who have family members with a BRCA-related cancer (breast, ovarian, tubal, or peritoneal cancers).  Mammogram.** / Every year beginning at age 44 years and continuing for as long as you are in good health. Consult with your health care provider.  Pap test.** / Every 3 years starting at age 58 years through age 22 or 39 years with 3 consecutive normal Pap tests. Testing can be stopped between 65 and 70 years with 3 consecutive normal Pap tests and no abnormal Pap or HPV tests in the past 10 years.  HPV screening.** / Every 3 years from ages 64 years through ages 70 or 61 years with a history of 3 consecutive normal Pap tests. Testing can be stopped between 65 and 70 years with 3 consecutive normal Pap tests and no abnormal Pap or HPV tests in the past 10 years.  Fecal occult blood test (FOBT) of stool. / Every year beginning at age 40 years and continuing until age 27 years. You may not need to do this test if you get a colonoscopy every 10 years.  Flexible sigmoidoscopy or colonoscopy.** / Every 5 years for a flexible sigmoidoscopy or every 10 years for a colonoscopy beginning at age 7 years and continuing until age 32 years.  Hepatitis C blood test.** / For all people born from [birth year] through 1964 and any individual with known risks for hepatitis C.  Osteoporosis screening.** / A one-time screening for women ages 30 years and over and women at risk for fractures or osteoporosis.  Skin self-exam. / Monthly.  Influenza vaccine. / Every year.  Tetanus, diphtheria, and acellular pertussis (Tdap/Td)  vaccine.** / 1 dose of Td every 10 years.  Varicella vaccine.** / Consult your health care provider.  Zoster vaccine.** / 1 dose for adults aged 35 years or older.  Pneumococcal 13-valent conjugate (PCV13) vaccine.** / Consult your health care provider.  Pneumococcal polysaccharide (PPSV23) vaccine.** / 1 dose for all adults aged 46 years and older.  Meningococcal vaccine.** / Consult your health care provider.  Hepatitis A vaccine.** / Consult your health care provider.  Hepatitis B vaccine.** / Consult your health care provider.  Haemophilus influenzae type b (Hib) vaccine.** / Consult your health care provider. ** Family history and personal history of risk and conditions may change your health care provider's recommendations.   This information is not intended to replace advice given to you by your health care provider. Make sure you discuss any questions you have with your health care provider.   Document Released: 09/17/2001 Document Revised: 08/12/2014 Document Reviewed: 12/17/2010 Elsevier Interactive Patient Education [employer].

## 2016-05-10 NOTE — Assessment & Plan Note (Signed)
Hydrate well and check magnesium

## 2016-05-10 NOTE — Assessment & Plan Note (Signed)
Encouraged to get adequate exercise, calcium and vitamin d intake 

## 2016-05-11 LAB — URINALYSIS
BILIRUBIN URINE: NEGATIVE
GLUCOSE, UA: NEGATIVE
Hgb urine dipstick: NEGATIVE
Ketones, ur: NEGATIVE
LEUKOCYTES UA: NEGATIVE
Nitrite: NEGATIVE
PROTEIN: NEGATIVE
SPECIFIC GRAVITY, URINE: 1.018 (ref 1.001–1.035)
pH: 5.5 (ref 5.0–8.0)

## 2016-05-11 LAB — COMPREHENSIVE METABOLIC PANEL
ALT: 14 U/L (ref 6–29)
AST: 19 U/L (ref 10–35)
Albumin: 4.2 g/dL (ref 3.6–5.1)
Alkaline Phosphatase: 52 U/L (ref 33–130)
BUN: 14 mg/dL (ref 7–25)
CHLORIDE: 93 mmol/L — AB (ref 98–110)
CO2: 37 mmol/L — AB (ref 20–31)
CREATININE: 0.56 mg/dL (ref 0.50–0.99)
Calcium: 9.6 mg/dL (ref 8.6–10.4)
GLUCOSE: 94 mg/dL (ref 65–99)
POTASSIUM: 4.1 mmol/L (ref 3.5–5.3)
SODIUM: 139 mmol/L (ref 135–146)
Total Bilirubin: 0.3 mg/dL (ref 0.2–1.2)
Total Protein: 7.3 g/dL (ref 6.1–8.1)

## 2016-05-11 LAB — LIPID PANEL
CHOL/HDL RATIO: 2.2 ratio (ref <=5.0)
Cholesterol: 167 mg/dL (ref 125–200)
HDL: 75 mg/dL (ref >=46)
LDL CALC: 72 mg/dL (ref <130)
TRIGLYCERIDES: 99 mg/dL (ref <150)
VLDL: 20 mg/dL (ref <30)

## 2016-05-11 LAB — VITAMIN D 25 HYDROXY (VIT D DEFICIENCY, FRACTURES): VIT D 25 HYDROXY: 40 ng/mL (ref 30–100)

## 2016-05-11 LAB — CBC
HCT: 40.3 % (ref 35.0–45.0)
HEMOGLOBIN: 12.7 g/dL (ref 11.7–15.5)
MCH: 31.8 pg (ref 27.0–33.0)
MCHC: 31.5 g/dL — AB (ref 32.0–36.0)
MCV: 100.8 fL — AB (ref 80.0–100.0)
MPV: 10.8 fL (ref 7.5–12.5)
Platelets: 248 10*3/uL (ref 140–400)
RBC: 4 MIL/uL (ref 3.80–5.10)
RDW: 13.7 % (ref 11.0–15.0)
WBC: 8.9 10*3/uL (ref 3.8–10.8)

## 2016-05-11 LAB — TSH: TSH: 0.45 m[IU]/L

## 2016-05-12 DIAGNOSIS — Z0000 Encounter for general adult medical examination without abnormal findings: Secondary | ICD-10-CM | POA: Insufficient documentation

## 2016-05-12 LAB — URINE CULTURE

## 2016-05-12 NOTE — Progress Notes (Signed)
Patient ID: Tanya Rush, female   DOB: November 27, 1945, 70 y.o.   MRN: [id number]   Subjective:    Patient ID: Tanya Rush, female    DOB: Apr 17, 1946, 70 y.o.   MRN: [id number]  Chief Complaint  Patient presents with  . Annual Exam    HPI Patient is in today for annual preventative physical exam and follow up on medical concerns. No recent illness or hospitalizations. She is staying active and trying to maintain a heart healthy diet. No recent falls or injury. No difficulties with ADLs. Denies CP/palp/SOB/HA/congestion/fevers/GI or GU c/o. Taking meds as prescribed  Past Medical History:  Diagnosis Date  . Asthma   . COPD (chronic obstructive pulmonary disease) ([place])    severe; O2 dependent-- followed by pulm at [facility]  . Elevated BP 09/28/2012  . Genital herpes   . History of ovarian cyst   . Medicare annual wellness visit, subsequent 05/04/2014  . Memory impairment 11/13/2014  . Osteoporosis 05/14/2015  . Overactive bladder 05/04/2014  . Scabies 01/16/2013  . Sun-damaged skin 04/04/2013  . Unsteady gait 05/14/2015    No past surgical history on file.  Family History  Problem Relation Age of Onset  . Heart disease Mother   . Stroke Mother   . Arthritis Father     rheumatoid  . Lymphoma Father   . COPD Father     smoker  . Alcohol abuse Brother     TBI  . Birth defects Brother   . Cancer Neg Hx   . Alcohol abuse Son   . Alcohol abuse Son     recovering x 3 years  . Reye's syndrome Brother   . Birth defects Brother   . Hip fracture Maternal Aunt     Social History   Social History  . Marital status: Married    Spouse name: N/A  . Number of children: 3  . Years of education: N/A   Occupational History  . Not on file.   Social History Main Topics  . Smoking status: Former Smoker    Years: 45.00    Quit date: 08/05/2006  . Smokeless tobacco: Never Used  . Alcohol use No  . Drug use: No  . Sexual activity: No     Comment: lives with husband, no dietary  restrictions   Other Topics Concern  . Not on file   Social History Narrative   3 children: 2 adopted, 1 biological          Outpatient Medications Prior to Visit  Medication Sig Dispense Refill  . albuterol (PROAIR HFA) 108 (90 BASE) MCG/ACT inhaler Inhale 2 puffs into the lungs daily as needed.      Marland Kitchen atorvastatin (LIPITOR) 20 MG tablet Take 1 tablet (20 mg total) by mouth daily at 6 PM. 90 tablet 1  . Fluticasone-Salmeterol (ADVAIR DISKUS) 250-50 MCG/DOSE AEPB Inhale 1 puff into the lungs every 12 (twelve) hours.     Marland Kitchen ipratropium (ATROVENT) 0.02 % nebulizer solution Inhale 0.25 mg into the lungs 4 (four) times daily.   6  . Multiple Vitamin (MULTIVITAMIN) tablet Take 1 tablet by mouth daily.    Marland Kitchen oxybutynin (DITROPAN-XL) 10 MG 24 hr tablet Take 1 tablet (10 mg total) by mouth at bedtime. 90 tablet 1  . valACYclovir (VALTREX) 1000 MG tablet TAKE 1 TABLET (1,000 MG TOTAL) BY MOUTH DAILY. 90 tablet 1  . hydrochlorothiazide (MICROZIDE) 12.5 MG capsule TAKE 1 CAPSULE (12.5 MG TOTAL) BY MOUTH DAILY. 90 capsule 1  .  oxybutynin (OXYTROL) 3.9 MG/24HR Place 1 patch onto the skin as directed. Change patch every 4 days    . pneumococcal 13-valent conjugate vaccine (PREVNAR 13) injection 0.5 mL      No facility-administered medications prior to visit.     Allergies  Allergen Reactions  . Chocolate     Other reaction(s): Unknown  . Aspirin     REACTION: sever chest pain    Review of Systems  Constitutional: Negative for chills, fever and malaise/fatigue.  HENT: Negative for congestion and hearing loss.   Eyes: Negative for discharge.  Respiratory: Negative for cough, sputum production and shortness of breath.   Cardiovascular: Negative for chest pain, palpitations and leg swelling.  Gastrointestinal: Negative for abdominal pain, blood in stool, constipation, diarrhea, heartburn, nausea and vomiting.  Genitourinary: Negative for dysuria, frequency, hematuria and urgency.    Musculoskeletal: Negative for back pain, falls and myalgias.  Skin: Negative for rash.  Neurological: Negative for dizziness, sensory change, loss of consciousness, weakness and headaches.  Endo/Heme/Allergies: Negative for environmental allergies. Does not bruise/bleed easily.  Psychiatric/Behavioral: Negative for depression and suicidal ideas. The patient is not nervous/anxious and does not have insomnia.        Objective:    Physical Exam  Constitutional: She is oriented to person, place, and time. She appears well-developed and well-nourished. No distress.  HENT:  Head: Normocephalic and atraumatic.  Eyes: Conjunctivae are normal.  Neck: Neck supple. No thyromegaly present.  Cardiovascular: Normal rate, regular rhythm and normal heart sounds.   No murmur heard. Pulmonary/Chest: Effort normal and breath sounds normal. No respiratory distress.  Abdominal: Soft. Bowel sounds are normal. She exhibits no distension and no mass. There is no tenderness.  Musculoskeletal: She exhibits no edema.  Lymphadenopathy:    She has no cervical adenopathy.  Neurological: She is alert and oriented to person, place, and time.  Skin: Skin is warm and dry.  Psychiatric: She has a normal mood and affect. Her behavior is normal.    BP (!) 160/100 (BP Location: Right Arm, Patient Position: Sitting, Cuff Size: Normal)   Pulse 78   Temp 98.1 F (36.7 C) (Oral)   Ht 4\' 11"  (1.499 m)   Wt 137 lb 3.2 oz (62.2 kg)   SpO2 99%   BMI 27.71 kg/m  Wt Readings from Last 3 Encounters:  05/10/16 137 lb 3.2 oz (62.2 kg)  04/04/16 136 lb 6.4 oz (61.9 kg)  11/09/15 138 lb (62.6 kg)     Lab Results  Component Value Date   WBC 8.9 05/10/2016   HGB 12.7 05/10/2016   HCT 40.3 05/10/2016   PLT 248 05/10/2016   GLUCOSE 94 05/10/2016   CHOL 167 05/10/2016   TRIG 99 05/10/2016   HDL 75 05/10/2016   LDLDIRECT 147.1 06/28/2008   LDLCALC 72 05/10/2016   ALT 14 05/10/2016   AST 19 05/10/2016   NA 139  05/10/2016   K 4.1 05/10/2016   CL 93 (L) 05/10/2016   CREATININE 0.56 05/10/2016   BUN 14 05/10/2016   CO2 37 (H) 05/10/2016   TSH 0.45 05/10/2016   HGBA1C 5.6 05/10/2016    Lab Results  Component Value Date   TSH 0.45 05/10/2016   Lab Results  Component Value Date   WBC 8.9 05/10/2016   HGB 12.7 05/10/2016   HCT 40.3 05/10/2016   MCV 100.8 (H) 05/10/2016   PLT 248 05/10/2016   Lab Results  Component Value Date   NA 139 05/10/2016   K  4.1 05/10/2016   CO2 37 (H) 05/10/2016   GLUCOSE 94 05/10/2016   BUN 14 05/10/2016   CREATININE 0.56 05/10/2016   BILITOT 0.3 05/10/2016   ALKPHOS 52 05/10/2016   AST 19 05/10/2016   ALT 14 05/10/2016   PROT 7.3 05/10/2016   ALBUMIN 4.2 05/10/2016   CALCIUM 9.6 05/10/2016   GFR 105.47 11/09/2015   Lab Results  Component Value Date   CHOL 167 05/10/2016   Lab Results  Component Value Date   HDL 75 05/10/2016   Lab Results  Component Value Date   LDLCALC 72 05/10/2016   Lab Results  Component Value Date   TRIG 99 05/10/2016   Lab Results  Component Value Date   CHOLHDL 2.2 05/10/2016   Lab Results  Component Value Date   HGBA1C 5.6 05/10/2016       Assessment & Plan:   Problem List Items Addressed This Visit    LEG CRAMPS    Hydrate well and check magnesium      Hyperlipidemia, mixed - Primary    Encouraged heart healthy diet, increase exercise, avoid trans fats, consider a krill oil cap daily. Tolerating Atorvastatin      Relevant Medications   hydrochlorothiazide (HYDRODIURIL) 25 MG tablet   Other Relevant Orders   Lipid panel (Completed)   Sun-damaged skin    Report sun damage concerns, wear sun screen when out of doors      Hyperglycemia   Relevant Orders   Hemoglobin A1c (Completed)   Comprehensive metabolic panel (Completed)   Osteoporosis    Encouraged to get adequate exercise, calcium and vitamin d intake      Relevant Orders   VITAMIN D 25 Hydroxy (Vit-D Deficiency, Fractures)  (Completed)   Preventative health care    Patient encouraged to maintain heart healthy diet, regular exercise, adequate sleep. Consider daily probiotics. Take medications as prescribed. Given and reviewed copy of ACP documents from [place] of State and encouraged to complete and return       Other Visit Diagnoses    Urinary frequency       Relevant Orders   Urinalysis (Completed)   Urine culture (Completed)   Hypertension, unspecified type       Relevant Medications   hydrochlorothiazide (HYDRODIURIL) 25 MG tablet   Other Relevant Orders   TSH (Completed)   CBC (Completed)      I have discontinued Ms. Rasbury hydrochlorothiazide. I am also having her start on hydrochlorothiazide. Additionally, I am having her maintain her Fluticasone-Salmeterol, albuterol, multivitamin, valACYclovir, ipratropium, oxybutynin, and atorvastatin. We will stop administering pneumococcal 13-valent conjugate vaccine.  Meds ordered this encounter  Medications  . hydrochlorothiazide (HYDRODIURIL) 25 MG tablet    Sig: Take 1 tablet (25 mg total) by mouth daily.    Dispense:  90 tablet    Refill:  1     Penni Homans, MD

## 2016-05-12 NOTE — Assessment & Plan Note (Signed)
Encouraged heart healthy diet, increase exercise, avoid trans fats, consider a krill oil cap daily. Tolerating Atorvastatin 

## 2016-05-12 NOTE — Assessment & Plan Note (Signed)
Well controlled. Encouraged heart healthy diet such as the DASH diet and exercise as tolerated.  

## 2016-05-12 NOTE — Assessment & Plan Note (Signed)
Report sun damage concerns, wear sun screen when out of doors

## 2016-05-12 NOTE — Assessment & Plan Note (Signed)
Patient encouraged to maintain heart healthy diet, regular exercise, adequate sleep. Consider daily probiotics. Take medications as prescribed. Given and reviewed copy of ACP documents from [place] Secretary of State and encouraged to complete and return 

## 2016-05-21 DIAGNOSIS — J449 Chronic obstructive pulmonary disease, unspecified: Secondary | ICD-10-CM | POA: Diagnosis not present

## 2016-06-11 ENCOUNTER — Ambulatory Visit (INDEPENDENT_AMBULATORY_CARE_PROVIDER_SITE_OTHER): Payer: Medicare Other | Admitting: Family Medicine

## 2016-06-11 VITALS — BP 135/100

## 2016-06-11 DIAGNOSIS — R030 Elevated blood-pressure reading, without diagnosis of hypertension: Secondary | ICD-10-CM | POA: Diagnosis not present

## 2016-06-11 MED ORDER — LISINOPRIL 10 MG PO TABS: 10.0000 mg | ORAL_TABLET | Freq: Every day | ORAL | 0 refills | Status: DC

## 2016-06-11 NOTE — Progress Notes (Signed)
Pre visit review using our clinic review tool, if applicable. No additional management support is needed unless otherwise documented below in the visit note.   BP check per office note 05/10/16: Return in about 4 weeks (around 06/07/2016) for RN BP check with CMP.           BP 127/100 and 135/100. Pt asymptomatic.          Dr.Blyth notified.    Begin Lisinopril 10 mg daily. Recheck BP in 1 week.  Pt notified of instructions and verbalizes understanding.

## 2016-06-11 NOTE — Patient Instructions (Signed)
Per Dr.Blyth:Begin Lisinopril 10mg  daily (take in the morning). Recheck BP in 1 week.

## 2016-06-12 LAB — COMPREHENSIVE METABOLIC PANEL
ALK PHOS: 48 U/L (ref 39–117)
ALT: 17 U/L (ref 0–35)
AST: 21 U/L (ref 0–37)
Albumin: 4.3 g/dL (ref 3.5–5.2)
BILIRUBIN TOTAL: 0.3 mg/dL (ref 0.2–1.2)
BUN: 14 mg/dL (ref 6–23)
CALCIUM: 9.8 mg/dL (ref 8.4–10.5)
CO2: 42 mEq/L — ABNORMAL HIGH (ref 19–32)
CREATININE: 0.62 mg/dL (ref 0.40–1.20)
Chloride: 90 mEq/L — ABNORMAL LOW (ref 96–112)
GFR: 101.38 mL/min (ref >60.00)
Glucose, Bld: 122 mg/dL — ABNORMAL HIGH (ref 70–99)
Potassium: 3.9 mEq/L (ref 3.5–5.1)
Sodium: 137 mEq/L (ref 135–145)
Total Protein: 7.5 g/dL (ref 6.0–8.3)

## 2016-06-12 NOTE — Progress Notes (Signed)
RN blood check note reviewed. Agree with documention and plan. 

## 2016-06-20 ENCOUNTER — Ambulatory Visit (INDEPENDENT_AMBULATORY_CARE_PROVIDER_SITE_OTHER): Payer: Medicare Other | Admitting: Family Medicine

## 2016-06-20 VITALS — BP 142/76 | HR 81

## 2016-06-20 DIAGNOSIS — R030 Elevated blood-pressure reading, without diagnosis of hypertension: Secondary | ICD-10-CM

## 2016-06-20 NOTE — Progress Notes (Signed)
Pre visit review using our clinic review tool, if applicable. No additional management support is needed unless otherwise documented below in the visit note.  Patient presents in office for blood pressure check. Medications reviewed with the patient. Today's readings were as follow: BP 148/72 P 80 & BP 142/76 P 81.  Per Dr. Charlett Blake: Increase Lisinopril to 10 MG twice daily. Continue current regimen with all other medications. Return to clinic one week from Tuesday on 07/02/16 for nurse visit to have blood pressure checked. Also, complete CMP lab work on that day.  Patient was made aware of the provider's instructions and verbalized understanding.   RN blood pressure check note reviewed. Agree with documention and plan.  Penni Homans, MD

## 2016-06-20 NOTE — Patient Instructions (Signed)
Per Dr. Charlett Blake: Increase Lisinopril to 10 MG twice daily. Continue current regimen with all other medications. Return to clinic one week from Tuesday on 07/02/16 for nurse visit to have blood pressure checked. Also, complete CMP lab work on that day.

## 2016-06-20 NOTE — Progress Notes (Signed)
RN blood pressure check note reviewed. Agree with documention and plan. 

## 2016-06-21 DIAGNOSIS — M25552 Pain in left hip: Secondary | ICD-10-CM | POA: Diagnosis not present

## 2016-06-21 DIAGNOSIS — S3690XA Unspecified injury of unspecified intra-abdominal organ, initial encounter: Secondary | ICD-10-CM | POA: Diagnosis not present

## 2016-06-21 DIAGNOSIS — I10 Essential (primary) hypertension: Secondary | ICD-10-CM | POA: Diagnosis not present

## 2016-06-21 DIAGNOSIS — J449 Chronic obstructive pulmonary disease, unspecified: Secondary | ICD-10-CM | POA: Diagnosis not present

## 2016-06-21 DIAGNOSIS — Z79899 Other long term (current) drug therapy: Secondary | ICD-10-CM | POA: Diagnosis not present

## 2016-06-21 DIAGNOSIS — R10819 Abdominal tenderness, unspecified site: Secondary | ICD-10-CM | POA: Diagnosis not present

## 2016-06-21 DIAGNOSIS — Z7951 Long term (current) use of inhaled steroids: Secondary | ICD-10-CM | POA: Diagnosis not present

## 2016-06-21 DIAGNOSIS — Z886 Allergy status to analgesic agent status: Secondary | ICD-10-CM | POA: Diagnosis not present

## 2016-06-21 DIAGNOSIS — S3289XA Fracture of other parts of pelvis, initial encounter for closed fracture: Secondary | ICD-10-CM | POA: Diagnosis not present

## 2016-06-21 DIAGNOSIS — Z91018 Allergy to other foods: Secondary | ICD-10-CM | POA: Diagnosis not present

## 2016-06-21 DIAGNOSIS — J439 Emphysema, unspecified: Secondary | ICD-10-CM | POA: Diagnosis not present

## 2016-06-21 DIAGNOSIS — E785 Hyperlipidemia, unspecified: Secondary | ICD-10-CM | POA: Diagnosis not present

## 2016-06-21 DIAGNOSIS — S76012A Strain of muscle, fascia and tendon of left hip, initial encounter: Secondary | ICD-10-CM | POA: Diagnosis not present

## 2016-06-21 DIAGNOSIS — Z87891 Personal history of nicotine dependence: Secondary | ICD-10-CM | POA: Diagnosis not present

## 2016-06-29 ENCOUNTER — Other Ambulatory Visit: Payer: Self-pay | Admitting: Family Medicine

## 2016-07-03 ENCOUNTER — Ambulatory Visit (HOSPITAL_BASED_OUTPATIENT_CLINIC_OR_DEPARTMENT_OTHER)
Admission: RE | Admit: 2016-07-03 | Discharge: 2016-07-03 | Disposition: A | Discharge status: Home / Self Care | Payer: Medicare Other | Source: Ambulatory Visit | Attending: Family Medicine | Admitting: Family Medicine

## 2016-07-03 ENCOUNTER — Ambulatory Visit (INDEPENDENT_AMBULATORY_CARE_PROVIDER_SITE_OTHER): Payer: Medicare Other | Admitting: Family Medicine

## 2016-07-03 VITALS — BP 127/68 | HR 95 | Temp 98.2°F | Ht 59.0 in

## 2016-07-03 DIAGNOSIS — R4182 Altered mental status, unspecified: Secondary | ICD-10-CM | POA: Diagnosis not present

## 2016-07-03 DIAGNOSIS — R918 Other nonspecific abnormal finding of lung field: Secondary | ICD-10-CM | POA: Insufficient documentation

## 2016-07-03 DIAGNOSIS — R52 Pain, unspecified: Secondary | ICD-10-CM

## 2016-07-03 DIAGNOSIS — R05 Cough: Secondary | ICD-10-CM | POA: Insufficient documentation

## 2016-07-03 DIAGNOSIS — D539 Nutritional anemia, unspecified: Secondary | ICD-10-CM

## 2016-07-03 DIAGNOSIS — S32502D Unspecified fracture of left pubis, subsequent encounter for fracture with routine healing: Secondary | ICD-10-CM | POA: Diagnosis not present

## 2016-07-03 DIAGNOSIS — R3915 Urgency of urination: Secondary | ICD-10-CM

## 2016-07-03 DIAGNOSIS — R059 Cough, unspecified: Secondary | ICD-10-CM

## 2016-07-03 MED ORDER — HYDROCODONE-ACETAMINOPHEN 5-325 MG PO TABS: 1.0000 | ORAL_TABLET | Freq: Four times a day (QID) | ORAL | 0 refills | Status: DC | PRN

## 2016-07-03 NOTE — Patient Instructions (Signed)
Please go to lab, then downstairs for a chest x-ray.  Then you can head home and I will be in touch with results Use the hydrocodone as needed for pain; remember this can make you drowsy.   I will arrange an MRI of your head to make sure nothing occurred recently such as a stroke

## 2016-07-03 NOTE — Progress Notes (Addendum)
[facility] [address] [place], [place] [address] [phone]  Date:  07/03/2016   Name:  JOSCELYN HARDRICK   DOB:  10/21/1945   MRN:  [id number]  PCP:  Danise Edge, MD    Chief Complaint: Hip Pain (c/o left hip pain after falling last week. Pt was seen at [facility] ER 11/17.  )   History of Present Illness:  Tanya Rush is a 69 y.o. very pleasant female patient who presents with the following:  Here today with hip pain.  She was seen at the ER at [facility] on 11/17 after a fall She had a CT of the hip while in the ER on 11/17 which revealed a pelvic fracture as below.  This is her first follow-up visit after this injury She did not hit her head so no imaging of her head was done.    INDICATION: Inability to walk after fall, negative x-ray  COMPARISON: Radiograph today  TECHNIQUE: Axial CT images of the left hip without contrast with multiplanar reformatsThree-dimensional reformats also performed.  FINDINGS: Linear lucency involving junction of the anterior aspect of the superior and inferior pubic rami with extension into the pubic body. Mild bowing of the mid aspect left inferior pubic ramus. No dislocation. Mineralization associated with the gluteus medius/minimus insertion likely representing sequela chronic tendinopathy or prior injury. Mild/moderate left hip osteoarthritis. Thickening of the obturator internus on the left Limited assessment of the tendons and ligaments by CT. Subcutaneous soft tissue reticulation left hip. Colonic diverticulosis without surrounding inflammatory changes. Additional comments:None.  She has complaint of persistent pain and does not have adequate pain control.  States that she has terrible pain every time she tries to move to transfer, toilet, or stand.    She is on oxygen chronically for her COPD. This is long term- her pulmonologist is at [facility].  No change in her oxygen  needs recently She has not noted any fever.  Some chills She has had some cough over the last couple of days- she is worried that she will develop pneumonia due to her immobility   She was given tramadol from the ER.  However this is not helping with her pain.    She does not have any history of CAD or stroke. However she has fallen several times over the last couple of years. They have not told anyone about these falls.   Also her husband states that over the last 10 days or so they have noted some decrease in her coordination (such as difficulty holding a glass steady to drink water), and some trouble with word finding/ difficulty finishing a sentence.   She is not having headaches in particular.  They also notice that her legs seem to be weak- she has difficulty standing for more than a minute or two.  However she admits this may be due to her pelvic pain.   Also they have noted urinary frequency since her accident.  Also, she cannot move fast enough to get to the toilet when she needs to go so she is wearing a diaper   Patient Active Problem List   Diagnosis Date Noted  . Preventative health care 05/12/2016  . Osteoporosis 05/14/2015  . Unsteady gait 05/14/2015  . Memory impairment 11/13/2014  . Hyperglycemia 05/04/2014  . Medicare annual wellness visit, subsequent 05/04/2014  . Insomnia 05/04/2014  . Overactive bladder 05/04/2014  . Sun-damaged skin 04/04/2013  . Elevated  BP 09/28/2012  . Arthralgia 05/03/2012  . Hyperlipidemia, mixed 09/30/2011  . COPD (chronic obstructive pulmonary disease) ([place]) 07/06/2008  . GENITAL HERPES 06/28/2008  . LEG CRAMPS 06/28/2008    Past Medical History:  Diagnosis Date  . Asthma   . COPD (chronic obstructive pulmonary disease) ([place])    severe; O2 dependent-- followed by pulm at [facility]  . Elevated BP 09/28/2012  . Genital herpes   . History of ovarian cyst   . Medicare annual wellness visit, subsequent 05/04/2014  . Memory impairment  11/13/2014  . Osteoporosis 05/14/2015  . Overactive bladder 05/04/2014  . Scabies 01/16/2013  . Sun-damaged skin 04/04/2013  . Unsteady gait 05/14/2015    No past surgical history on file.  Social History  Substance Use Topics  . Smoking status: Former Smoker    Years: 45.00    Quit date: 08/05/2006  . Smokeless tobacco: Never Used  . Alcohol use No    Family History  Problem Relation Age of Onset  . Heart disease Mother   . Stroke Mother   . Arthritis Father     rheumatoid  . Lymphoma Father   . COPD Father     smoker  . Alcohol abuse Brother     TBI  . Birth defects Brother   . Cancer Neg Hx   . Alcohol abuse Son   . Alcohol abuse Son     recovering x 3 years  . Reye's syndrome Brother   . Birth defects Brother   . Hip fracture Maternal Aunt     Allergies  Allergen Reactions  . Chocolate     Other reaction(s): Unknown  . Aspirin     REACTION: sever chest pain    Medication list has been reviewed and updated.  Current Outpatient Prescriptions on File Prior to Visit  Medication Sig Dispense Refill  . albuterol (PROAIR HFA) 108 (90 BASE) MCG/ACT inhaler Inhale 2 puffs into the lungs daily as needed.      Marland Kitchen atorvastatin (LIPITOR) 20 MG tablet Take 1 tablet (20 mg total) by mouth daily at 6 PM. 90 tablet 1  . Fluticasone-Salmeterol (ADVAIR DISKUS) 250-50 MCG/DOSE AEPB Inhale 1 puff into the lungs every 12 (twelve) hours.     . hydrochlorothiazide (HYDRODIURIL) 25 MG tablet Take 1 tablet (25 mg total) by mouth daily. 90 tablet 1  . ipratropium (ATROVENT) 0.02 % nebulizer solution Inhale 0.25 mg into the lungs 4 (four) times daily.   6  . lisinopril (PRINIVIL,ZESTRIL) 10 MG tablet Take 1 tablet (10 mg total) by mouth daily. (Patient taking differently: Take 10 mg by mouth 2 (two) times daily. ) 30 tablet 0  . Multiple Vitamin (MULTIVITAMIN) tablet Take 1 tablet by mouth daily.    Marland Kitchen oxybutynin (DITROPAN-XL) 10 MG 24 hr tablet Take 1 tablet (10 mg total) by mouth at  bedtime. 90 tablet 1  . valACYclovir (VALTREX) 1000 MG tablet TAKE 1 TABLET (1,000 MG TOTAL) BY MOUTH DAILY. 90 tablet 1  . valACYclovir (VALTREX) 1000 MG tablet TAKE 1 TABLET EVERY DAY 90 tablet 1   No current facility-administered medications on file prior to visit.     Review of Systems:  As per HPI- otherwise negative.   Physical Examination: Vitals:   07/03/16 1359  BP: 127/68  Pulse: 95  Temp: 98.2 F (36.8 C)   Vitals:   07/03/16 1359  Height: '4\' 11"'$  (1.499 m)   There is no height or weight on file to calculate BMI. Ideal Body Weight:  Weight in (lb) to have BMI = 25: 123.5  GEN: WDWN, NAD, Non-toxic, A & O x 3, normal weight, looks well but is in pain with movement.  Here today with her husband  HEENT: Atraumatic, Normocephalic. Neck supple. No masses, No LAD. Ears and Nose: No external deformity. CV: RRR, No M/G/R. No JVD. No thrill. No extra heart sounds. PULM: CTA B, no wheezes, crackles, rhonchi. No retractions. No resp. distress. No accessory muscle use.  Moving air normally Wearing oxygen via [place] ABD: S, NT, ND EXTR: No c/c/e.  On exam she has normal movement and strength, sensation, RAM of both UE.  I do not appreciate any mental slowing or sign of an acute stroke.  LE testing is limited by pelvic fracture- normal strength right leg, too much pain to test left leg NEURO sitting in [facility] PSYCH: Normally interactive. Conversant. Not depressed or anxious appearing.  Calm demeanor.   Results for orders placed or performed in visit on 06/11/16  Comp Met (CMET)  Result Value Ref Range   Sodium 137 135 - 145 mEq/L   Potassium 3.9 3.5 - 5.1 mEq/L   Chloride 90 (L) 96 - 112 mEq/L   CO2 42 (H) 19 - 32 mEq/L   Glucose, Bld 122 (H) 70 - 99 mg/dL   BUN 14 6 - 23 mg/dL   Creatinine, Ser 0.62 0.40 - 1.20 mg/dL   Total Bilirubin 0.3 0.2 - 1.2 mg/dL   Alkaline Phosphatase 48 39 - 117 U/L   AST 21 0 - 37 U/L   ALT 17 0 - 35 U/L   Total Protein 7.5 6.0 - 8.3 g/dL   Albumin  4.3 3.5 - 5.2 g/dL   Calcium 9.8 8.4 - 10.5 mg/dL   GFR 101.38 >60.00 mL/min   Tried to get a urine sample today but pt missed hat, could not go again.  Will do another day soon.  Also decided to defer repeat films of her pelvis today as she is in a lot off pain that is not controlled.  Will plan to get these in a day or so once her pain is controlled.   Assessment and Plan: Closed fracture of left pubis with routine healing, unspecified portion of pubis, subsequent encounter - Plan: HYDROcodone-acetaminophen (NORCO/VICODIN) 5-325 MG tablet, DG Pelvis 1-2 Views  Cough - Plan: DG Chest 2 View  Altered mental status, unspecified altered mental status type - Plan: Comprehensive metabolic panel, CBC, MR Brain W Wo Contrast  Urinary urgency - Plan: Urine culture, POCT urinalysis dipstick, CANCELED: POCT urinalysis dipstick, CANCELED: Urine culture, CANCELED: Urine culture  Uncontrolled pain  Here today to follow-up from dx of pelvic fracture made at [facility] ER 12 days ago She has just tramadol for pain but this is not giving her adequate control.  Will step up to hydrocodone 18m; advised to start with 1/2 or 1 tablet every 6-8 hours as needed and titrate to pain relief.  She is not on any other sedating mediations  Concern that she may develop pneumonia given reduced mobility- CXR today.  Called and discussed with his husband when report came in (pt was sleeping and has good pain relief per his report); encouraged her to take deep breaths several times a day to keep her lungs well inflated   Dg Chest 2 View  Result Date: 07/03/2016 CLINICAL DATA:  Cough for several days EXAM: CHEST  2 VIEW COMPARISON:  Chest x-ray of 04/08/2010, and CT of the chest of 01/17/2009 FINDINGS: The  lungs are clear but somewhat hyperaerated with minimal basilar atelectasis or scarring noted. The hyper aeration suggest emphysema with increased AP diameter. No pneumonia or effusion is seen. Mediastinal and hilar contours  are unremarkable. The heart is within normal limits in size. No acute bony abnormality is seen. IMPRESSION: Hyperaeration consistent with emphysema. Mild basilar atelectasis or scarring. No definite active process. Electronically Signed   By: Ivar Drape M.D.   On: 07/03/2016 15:45   Will have her come back in 1-2 days to give a urine sample and have pelvis films; ordered these today They have noted some apparent mental status changes and difficulty with her motor function over the last 10 days.  Will arrange an MRI to evaluate for possible stroke.    Signed Lamar Blinks, MD 12/1- Called to check on pt and go over her labs.  CMP is ok. She has mild leukocytosis which is likely due to stress.  However will want to get her urine culture done (CXR negative).  Also anemia- this is new from the past.  Her injury occurred 2 weeks ago and she is stable so we do not suspect any internal bleeding.  She will get her MRI and pelvic films tomorrow. They plan to come in for urine culture and repeat CBC- lab visit only-  next week.  Tahirah reports that she is feeling much better and that her pain is under control Will follow-up with them pending her imaging results tomorrow

## 2016-07-03 NOTE — Progress Notes (Signed)
Pre visit review using our clinic review tool, if applicable. No additional management support is needed unless otherwise documented below in the visit note. 

## 2016-07-04 LAB — CBC
HCT: 32.7 % — ABNORMAL LOW (ref 36.0–46.0)
Hemoglobin: 10.6 g/dL — ABNORMAL LOW (ref 12.0–15.0)
MCHC: 32.4 g/dL (ref 30.0–36.0)
MCV: 100.7 fl — ABNORMAL HIGH (ref 78.0–100.0)
PLATELETS: 290 10*3/uL (ref 150.0–400.0)
RBC: 3.25 Mil/uL — ABNORMAL LOW (ref 3.87–5.11)
RDW: 13.8 % (ref 11.5–15.5)
WBC: 13.8 10*3/uL — AB (ref 4.0–10.5)

## 2016-07-04 LAB — COMPREHENSIVE METABOLIC PANEL
ALBUMIN: 3.6 g/dL (ref 3.5–5.2)
ALK PHOS: 65 U/L (ref 39–117)
ALT: 22 U/L (ref 0–35)
AST: 26 U/L (ref 0–37)
BILIRUBIN TOTAL: 0.3 mg/dL (ref 0.2–1.2)
BUN: 17 mg/dL (ref 6–23)
CALCIUM: 9.5 mg/dL (ref 8.4–10.5)
CO2: 41 mEq/L — ABNORMAL HIGH (ref 19–32)
Chloride: 92 mEq/L — ABNORMAL LOW (ref 96–112)
Creatinine, Ser: 0.67 mg/dL (ref 0.40–1.20)
GFR: 92.68 mL/min (ref >60.00)
GLUCOSE: 107 mg/dL — AB (ref 70–99)
Potassium: 4.5 mEq/L (ref 3.5–5.1)
Sodium: 138 mEq/L (ref 135–145)
TOTAL PROTEIN: 6.8 g/dL (ref 6.0–8.3)

## 2016-07-05 NOTE — Addendum Note (Signed)
Addended by: Lamar Blinks C on: 07/05/2016 08:42 AM   Modules accepted: Orders

## 2016-07-06 ENCOUNTER — Ambulatory Visit (HOSPITAL_BASED_OUTPATIENT_CLINIC_OR_DEPARTMENT_OTHER)
Admission: RE | Admit: 2016-07-06 | Discharge: 2016-07-06 | Disposition: A | Discharge status: Home / Self Care | Payer: Medicare Other | Source: Ambulatory Visit | Attending: Family Medicine | Admitting: Family Medicine

## 2016-07-06 DIAGNOSIS — G319 Degenerative disease of nervous system, unspecified: Secondary | ICD-10-CM | POA: Insufficient documentation

## 2016-07-06 DIAGNOSIS — S32502D Unspecified fracture of left pubis, subsequent encounter for fracture with routine healing: Secondary | ICD-10-CM

## 2016-07-06 DIAGNOSIS — X58XXXD Exposure to other specified factors, subsequent encounter: Secondary | ICD-10-CM | POA: Diagnosis not present

## 2016-07-06 DIAGNOSIS — R4182 Altered mental status, unspecified: Secondary | ICD-10-CM | POA: Insufficient documentation

## 2016-07-06 DIAGNOSIS — S32502A Unspecified fracture of left pubis, initial encounter for closed fracture: Secondary | ICD-10-CM | POA: Diagnosis not present

## 2016-07-08 ENCOUNTER — Other Ambulatory Visit: Payer: Self-pay | Admitting: Family Medicine

## 2016-07-08 DIAGNOSIS — R030 Elevated blood-pressure reading, without diagnosis of hypertension: Secondary | ICD-10-CM

## 2016-07-08 NOTE — Telephone Encounter (Signed)
Called to go over recent films. [place] radiologist was not able to compare her pelvic plain films directly with CT from [facility], but she has minor displacement and should do well with conservative treatment.  She is doing better with pain control and is seeing me on Thursday for follow-up Her MRI brain does not show any specific finding but does show likely age related atrophy and small vessel disease.  We will want to control her BP and cholesterol to keep her vessels as healthy as we can

## 2016-07-11 ENCOUNTER — Ambulatory Visit (INDEPENDENT_AMBULATORY_CARE_PROVIDER_SITE_OTHER): Payer: Medicare Other | Admitting: Family Medicine

## 2016-07-11 VITALS — BP 126/68 | HR 90 | Temp 98.4°F | Ht 59.0 in

## 2016-07-11 DIAGNOSIS — I952 Hypotension due to drugs: Secondary | ICD-10-CM | POA: Diagnosis not present

## 2016-07-11 DIAGNOSIS — N390 Urinary tract infection, site not specified: Secondary | ICD-10-CM

## 2016-07-11 DIAGNOSIS — R3915 Urgency of urination: Secondary | ICD-10-CM | POA: Diagnosis not present

## 2016-07-11 DIAGNOSIS — B952 Enterococcus as the cause of diseases classified elsewhere: Secondary | ICD-10-CM

## 2016-07-11 DIAGNOSIS — S32502D Unspecified fracture of left pubis, subsequent encounter for fracture with routine healing: Secondary | ICD-10-CM | POA: Diagnosis not present

## 2016-07-11 LAB — POCT URINALYSIS DIP (MANUAL ENTRY)
Bilirubin, UA: NEGATIVE
Blood, UA: NEGATIVE
Glucose, UA: NEGATIVE
Ketones, POC UA: NEGATIVE
LEUKOCYTES UA: NEGATIVE
NITRITE UA: NEGATIVE
PH UA: 6
PROTEIN UA: NEGATIVE
Spec Grav, UA: >=1.03
UROBILINOGEN UA: NEGATIVE

## 2016-07-11 MED ORDER — HYDROCODONE-ACETAMINOPHEN 5-325 MG PO TABS: 1.0000 | ORAL_TABLET | Freq: Four times a day (QID) | ORAL | 0 refills | Status: DC | PRN

## 2016-07-11 NOTE — Progress Notes (Signed)
Pre visit review using our clinic review tool, if applicable. No additional management support is needed unless otherwise documented below in the visit note. 

## 2016-07-11 NOTE — Patient Instructions (Addendum)
Let's have you stop your lisinopril 10 mg at least for the time being- your blood pressure is too low.    We may need to decrease your HCTZ as well- please let me know how your BP looks over the next few days I will culture your urine and we will also repeat your blood count today Please monitor your BP at home 1-2x a day and keep a record, please let me know how it looks!   You can continue your pain medication as needed

## 2016-07-11 NOTE — Progress Notes (Addendum)
[facility] [address], [place], [place] [address] [phone]  Date:  07/11/2016   Name:  Tanya Rush   DOB:  09-19-1945   MRN:  [id number]  PCP:  Penni Homans, MD    Chief Complaint: Follow-up (Pt here for f/u visit. Would like refill on HYDROcodone. )   History of Present Illness:  Tanya Rush is a 70 y.o. very pleasant female patient who presents with the following:  Here today for a follow-up visit- seen here a week ago to follow-up from a pelvic fracture  She is wearing her oxygen as is normal for her- her breathing seems to be about the same as always Her pain is under adequate control- she is using the hydrocodone, could use a refill but still has some pills left She is not feeling constipated, but no BM since yesterday.  She will go back on her probiotics and use a stool softener as needed  She has not noted any urinary sx No fever  No belly pain She is eating and drinking normally Noted that her BP is significantly lower than is normal for her- she had not noted any sx such as feeling lightheaded No specific neurological sx other than what we had discussed at our last visit.  We did an MRI brain on 12/2 due to some dis-coordination and weakness sx that they had noted since her fall.  This was non-specific/ negative  IMPRESSION: Motion degraded scan demonstrating no gross intracranial abnormality or obvious postcontrast enhancement.  Atrophy and small vessel disease.  They have not noted any specific neurological changes- she still feels weak, but they think this is likely due to her injury Normal CMP at last visit except for chronically high Co2, but her Hg had dropped about 2 gm.  Will recheck for her today  BP Readings from Last 3 Encounters:  07/11/16 (!) 90/58  07/03/16 127/68  06/20/16 (!) 142/76   Pulse Readings from Last 3 Encounters:  07/11/16 (!) 106  07/03/16 95  06/20/16 81      Patient Active Problem List   Diagnosis Date Noted  . Preventative health care 05/12/2016  . Osteoporosis 05/14/2015  . Unsteady gait 05/14/2015  . Memory impairment 11/13/2014  . Hyperglycemia 05/04/2014  . Medicare annual wellness visit, subsequent 05/04/2014  . Insomnia 05/04/2014  . Overactive bladder 05/04/2014  . Sun-damaged skin 04/04/2013  . Elevated BP 09/28/2012  . Arthralgia 05/03/2012  . Hyperlipidemia, mixed 09/30/2011  . COPD (chronic obstructive pulmonary disease) ([place]) 07/06/2008  . GENITAL HERPES 06/28/2008  . LEG CRAMPS 06/28/2008    Past Medical History:  Diagnosis Date  . Asthma   . COPD (chronic obstructive pulmonary disease) ([place])    severe; O2 dependent-- followed by pulm at [facility]  . Elevated BP 09/28/2012  . Genital herpes   . History of ovarian cyst   . Medicare annual wellness visit, subsequent 05/04/2014  . Memory impairment 11/13/2014  . Osteoporosis 05/14/2015  . Overactive bladder 05/04/2014  . Scabies 01/16/2013  . Sun-damaged skin 04/04/2013  . Unsteady gait 05/14/2015    No past surgical history on file.  Social History  Substance Use Topics  . Smoking status: Former Smoker    Years: 45.00    Quit date: 08/05/2006  . Smokeless tobacco: Never Used  . Alcohol use No    Family History  Problem Relation Age of Onset  . Heart disease Mother   .  Stroke Mother   . Arthritis Father     rheumatoid  . Lymphoma Father   . COPD Father     smoker  . Alcohol abuse Brother     TBI  . Birth defects Brother   . Cancer Neg Hx   . Alcohol abuse Son   . Alcohol abuse Son     recovering x 3 years  . Reye's syndrome Brother   . Birth defects Brother   . Hip fracture Maternal Aunt     Allergies  Allergen Reactions  . Chocolate     Other reaction(s): Unknown  . Aspirin     REACTION: sever chest pain    Medication list has been reviewed and updated.  Current Outpatient Prescriptions on File Prior to Visit  Medication Sig Dispense  Refill  . albuterol (PROAIR HFA) 108 (90 BASE) MCG/ACT inhaler Inhale 2 puffs into the lungs daily as needed.      Marland Kitchen atorvastatin (LIPITOR) 20 MG tablet Take 1 tablet (20 mg total) by mouth daily at 6 PM. 90 tablet 1  . Fluticasone-Salmeterol (ADVAIR DISKUS) 250-50 MCG/DOSE AEPB Inhale 1 puff into the lungs every 12 (twelve) hours.     . hydrochlorothiazide (HYDRODIURIL) 25 MG tablet Take 1 tablet (25 mg total) by mouth daily. 90 tablet 1  . HYDROcodone-acetaminophen (NORCO/VICODIN) 5-325 MG tablet Take 1-2 tablets by mouth every 6 (six) hours as needed. 40 tablet 0  . ipratropium (ATROVENT) 0.02 % nebulizer solution Inhale 0.25 mg into the lungs 4 (four) times daily.   6  . lisinopril (PRINIVIL,ZESTRIL) 10 MG tablet TAKE 1 TABLET (10 MG TOTAL) BY MOUTH DAILY. 30 tablet 6  . Multiple Vitamin (MULTIVITAMIN) tablet Take 1 tablet by mouth daily.    Marland Kitchen oxybutynin (DITROPAN-XL) 10 MG 24 hr tablet Take 1 tablet (10 mg total) by mouth at bedtime. 90 tablet 1  . valACYclovir (VALTREX) 1000 MG tablet TAKE 1 TABLET (1,000 MG TOTAL) BY MOUTH DAILY. 90 tablet 1  . valACYclovir (VALTREX) 1000 MG tablet TAKE 1 TABLET EVERY DAY 90 tablet 1   No current facility-administered medications on file prior to visit.     Review of Systems:  As per HPI- otherwise negative. She is eating and drinking normally  Physical Examination: Blood pressure (!) 90/58, pulse (!) 106, temperature 98.4 F (36.9 C), temperature source Oral, height 4\' 11"  (1.499 m).  Vitals:   07/11/16 1451  Height: 4\' 11"  (1.499 m)   There is no height or weight on file to calculate BMI. Ideal Body Weight: Weight in (lb) to have BMI = 25: 123.5  GEN: WDWN, NAD, Non-toxic, A & O x 3, wearing O2 with portable concentrator HEENT: Atraumatic, Normocephalic. Neck supple. No masses, No LAD. Ears and Nose: No external deformity. CV: RRR, No M/G/R. No JVD. No thrill. No extra heart sounds. PULM: CTA B, no wheezes, crackles, rhonchi. No  retractions. No resp. distress. No accessory muscle use. ABD: S, NT, ND. No rebound. No HSM.  Belly is benign, pain over left anterior pelvis at fracture site EXTR: No c/c/e NEURO sitting in WC.  Able to get up with help and stand on her own, but cannot walk due to pain PSYCH: Normally interactive. Conversant. Not depressed or anxious appearing.  Calm demeanor.   Gave her water to drink until she was able to give a urine sample VS improved- BP to 126/68, pulse under 100  Results for orders placed or performed in visit on 07/11/16  POCT urinalysis dipstick  Result Value Ref Range   Color, UA yellow yellow   Clarity, UA clear clear   Glucose, UA negative negative   Bilirubin, UA negative negative   Ketones, POC UA negative negative   Spec Grav, UA >=1.030    Blood, UA negative negative   pH, UA 6.0    Protein Ur, POC negative negative   Urobilinogen, UA negative    Nitrite, UA Negative Negative   Leukocytes, UA Negative Negative    Assessment and Plan: Closed fracture of left pubis with routine healing, unspecified portion of pubis, subsequent encounter - Plan: HYDROcodone-acetaminophen (NORCO/VICODIN) 5-325 MG tablet  Hypotension due to drugs - Plan: POCT urinalysis dipstick, Urine culture, CBC  Here today to follow-up on her pelvic fracture We are controlling her pain- refilled vicodin for her today.  She feels that her pain is being managed although it is still present Repeat CBC today due to drop in her Hg Hypotension- likely due to immobility.  Will hold lisinopril. Check urine culture UA reassuring They are able to check her BP at home and will follow 1-2x a day and keep me updated via mychart Will plan further follow- up pending labs.   Signed Lamar Blinks, MD Called pt and spoke with her=  Urine culture shows a possible UTI- may be contamination but she is currently in a WC and less mobile, so will treat her with amoxicillin.  Called in to her drug store.    Results for orders placed or performed in visit on 07/11/16  Urine culture  Result Value Ref Range   Culture ENTEROCOCCUS SPECIES    Colony Count 50,000-100,000 CFU/mL    Organism ID, Bacteria ENTEROCOCCUS SPECIES       Susceptibility   Enterococcus species -  (no method available)    AMPICILLIN <=2 Sensitive     LEVOFLOXACIN 1 Sensitive     NITROFURANTOIN <=16 Sensitive     VANCOMYCIN 2 Sensitive     TETRACYCLINE >=16 Resistant   CBC  Result Value Ref Range   WBC 9.9 4.0 - 10.5 K/uL   RBC 3.22 (L) 3.87 - 5.11 Mil/uL   Platelets 253.0 150.0 - 400.0 K/uL   Hemoglobin 10.5 (L) 12.0 - 15.0 g/dL   HCT 32.2 (L) 36.0 - 46.0 %   MCV 100.0 78.0 - 100.0 fl   MCHC 32.7 30.0 - 36.0 g/dL   RDW 14.0 11.5 - 15.5 %  POCT urinalysis dipstick  Result Value Ref Range   Color, UA yellow yellow   Clarity, UA clear clear   Glucose, UA negative negative   Bilirubin, UA negative negative   Ketones, POC UA negative negative   Spec Grav, UA >=1.030    Blood, UA negative negative   pH, UA 6.0    Protein Ur, POC negative negative   Urobilinogen, UA negative    Nitrite, UA Negative Negative   Leukocytes, UA Negative Negative

## 2016-07-12 LAB — CBC
HEMATOCRIT: 32.2 % — AB (ref 36.0–46.0)
HEMOGLOBIN: 10.5 g/dL — AB (ref 12.0–15.0)
MCHC: 32.7 g/dL (ref 30.0–36.0)
MCV: 100 fl (ref 78.0–100.0)
Platelets: 253 10*3/uL (ref 150.0–400.0)
RBC: 3.22 Mil/uL — AB (ref 3.87–5.11)
RDW: 14 % (ref 11.5–15.5)
WBC: 9.9 10*3/uL (ref 4.0–10.5)

## 2016-07-13 LAB — URINE CULTURE

## 2016-07-15 ENCOUNTER — Encounter: Payer: Self-pay | Admitting: Family Medicine

## 2016-07-15 MED ORDER — AMOXICILLIN 500 MG PO CAPS: 500.0000 mg | ORAL_CAPSULE | Freq: Two times a day (BID) | ORAL | 0 refills | Status: DC

## 2016-07-15 NOTE — Addendum Note (Signed)
Addended by: Lamar Blinks C on: 07/15/2016 07:02 PM   Modules accepted: Orders

## 2016-07-21 DIAGNOSIS — J449 Chronic obstructive pulmonary disease, unspecified: Secondary | ICD-10-CM | POA: Diagnosis not present

## 2016-07-24 ENCOUNTER — Encounter: Payer: Self-pay | Admitting: Family Medicine

## 2016-07-25 ENCOUNTER — Encounter: Payer: Self-pay | Admitting: Family Medicine

## 2016-07-25 ENCOUNTER — Other Ambulatory Visit: Payer: Self-pay | Admitting: Family Medicine

## 2016-07-25 DIAGNOSIS — S32502D Unspecified fracture of left pubis, subsequent encounter for fracture with routine healing: Secondary | ICD-10-CM

## 2016-07-25 MED ORDER — HYDROCODONE-ACETAMINOPHEN 5-325 MG PO TABS: 1.0000 | ORAL_TABLET | Freq: Four times a day (QID) | ORAL | 0 refills | Status: DC | PRN

## 2016-07-25 NOTE — Telephone Encounter (Signed)
Called her- I will refill her medication for pain. She is taking the pain pill 2-3x a day and this enables her to move around.  She will come and see after new years for a repeat visit

## 2016-08-09 ENCOUNTER — Other Ambulatory Visit: Payer: Self-pay | Admitting: Family Medicine

## 2016-08-09 DIAGNOSIS — R030 Elevated blood-pressure reading, without diagnosis of hypertension: Secondary | ICD-10-CM

## 2016-08-09 MED ORDER — LISINOPRIL 10 MG PO TABS: 10.0000 mg | ORAL_TABLET | Freq: Every day | ORAL | 1 refills | Status: DC

## 2016-08-12 ENCOUNTER — Ambulatory Visit (HOSPITAL_BASED_OUTPATIENT_CLINIC_OR_DEPARTMENT_OTHER)
Admission: RE | Admit: 2016-08-12 | Discharge: 2016-08-12 | Disposition: A | Discharge status: Home / Self Care | Payer: Medicare Other | Source: Ambulatory Visit | Attending: Family Medicine | Admitting: Family Medicine

## 2016-08-12 ENCOUNTER — Ambulatory Visit (INDEPENDENT_AMBULATORY_CARE_PROVIDER_SITE_OTHER): Payer: Medicare Other | Admitting: Family Medicine

## 2016-08-12 VITALS — BP 110/65 | HR 83 | Temp 98.8°F | Ht 59.0 in | Wt 133.0 lb

## 2016-08-12 DIAGNOSIS — S32502D Unspecified fracture of left pubis, subsequent encounter for fracture with routine healing: Secondary | ICD-10-CM

## 2016-08-12 DIAGNOSIS — M47816 Spondylosis without myelopathy or radiculopathy, lumbar region: Secondary | ICD-10-CM | POA: Insufficient documentation

## 2016-08-12 DIAGNOSIS — R29898 Other symptoms and signs involving the musculoskeletal system: Secondary | ICD-10-CM

## 2016-08-12 DIAGNOSIS — M160 Bilateral primary osteoarthritis of hip: Secondary | ICD-10-CM | POA: Insufficient documentation

## 2016-08-12 DIAGNOSIS — X58XXXA Exposure to other specified factors, initial encounter: Secondary | ICD-10-CM | POA: Diagnosis not present

## 2016-08-12 DIAGNOSIS — D539 Nutritional anemia, unspecified: Secondary | ICD-10-CM

## 2016-08-12 DIAGNOSIS — I70208 Unspecified atherosclerosis of native arteries of extremities, other extremity: Secondary | ICD-10-CM | POA: Diagnosis not present

## 2016-08-12 DIAGNOSIS — S32512A Fracture of superior rim of left pubis, initial encounter for closed fracture: Secondary | ICD-10-CM | POA: Diagnosis not present

## 2016-08-12 DIAGNOSIS — S32592A Other specified fracture of left pubis, initial encounter for closed fracture: Secondary | ICD-10-CM | POA: Diagnosis not present

## 2016-08-12 MED ORDER — HYDROCODONE-ACETAMINOPHEN 5-325 MG PO TABS: 1.0000 | ORAL_TABLET | Freq: Four times a day (QID) | ORAL | 0 refills | Status: DC | PRN

## 2016-08-12 NOTE — Progress Notes (Signed)
[place] at [facility] [address] [place], [place] [address] [phone]  Date:  08/12/2016   Name:  Tanya Rush   DOB:  1946/05/05   MRN:  [id number]  PCP:  Penni Homans, MD    Chief Complaint: Follow-up   History of Present Illness:  Tanya Rush is a 71 y.o. very pleasant female patient who presents with the following:  Here today to follow-up from pelvic fragility fracture that occurred following a fall from standing on 06/21/2016.  She was last seen here about one month ago with same- overall she is making good progress.  She is using a WC for leaving the house and is often in the chair at home.  However she is walking in the home some daily, and today showered independently.  She is independent in dressing and grooming.  Her pain is better but she still does have pain with any walking/ other movement.  Admits that she does not want to use her pain medication so she will just hold still to avoid having any pain  No fever, chills, abd pain.  appetite is "so so" but her bowels and bladder are functioning normally.  She notes that her left leg feels weak- this is the side where she has more pain from her fracture.  However no numbness is present  She would be willing to do home PT in order to work on her strength   She does have COPD and uses oxygen at baseline; however prior to her injury she was able to walk a good distance - able to shop for groceries, etc.    Anemia noted on last CBC- will repeat today  Patient Active Problem List   Diagnosis Date Noted  . Preventative health care 05/12/2016  . Osteoporosis 05/14/2015  . Unsteady gait 05/14/2015  . Memory impairment 11/13/2014  . Hyperglycemia 05/04/2014  . Medicare annual wellness visit, subsequent 05/04/2014  . Insomnia 05/04/2014  . Overactive bladder 05/04/2014  . Sun-damaged skin 04/04/2013  . Elevated BP 09/28/2012  . Arthralgia 05/03/2012  .  Hyperlipidemia, mixed 09/30/2011  . COPD (chronic obstructive pulmonary disease) ([place]) 07/06/2008  . GENITAL HERPES 06/28/2008  . LEG CRAMPS 06/28/2008    Past Medical History:  Diagnosis Date  . Asthma   . COPD (chronic obstructive pulmonary disease) ([place])    severe; O2 dependent-- followed by pulm at [facility]  . Elevated BP 09/28/2012  . Genital herpes   . History of ovarian cyst   . Medicare annual wellness visit, subsequent 05/04/2014  . Memory impairment 11/13/2014  . Osteoporosis 05/14/2015  . Overactive bladder 05/04/2014  . Scabies 01/16/2013  . Sun-damaged skin 04/04/2013  . Unsteady gait 05/14/2015    No past surgical history on file.  Social History  Substance Use Topics  . Smoking status: Former Smoker    Years: 45.00    Quit date: 08/05/2006  . Smokeless tobacco: Never Used  . Alcohol use No    Family History  Problem Relation Age of Onset  . Heart disease Mother   . Stroke Mother   . Arthritis Father     rheumatoid  . Lymphoma Father   . COPD Father     smoker  . Alcohol abuse Brother     TBI  . Birth defects Brother   . Cancer Neg Hx   . Alcohol abuse Son   . Alcohol abuse Son     recovering x  3 years  . Reye's syndrome Brother   . Birth defects Brother   . Hip fracture Maternal Aunt     Allergies  Allergen Reactions  . Chocolate     Other reaction(s): Unknown  . Aspirin     REACTION: sever chest pain    Medication list has been reviewed and updated.  Current Outpatient Prescriptions on File Prior to Visit  Medication Sig Dispense Refill  . albuterol (PROAIR HFA) 108 (90 BASE) MCG/ACT inhaler Inhale 2 puffs into the lungs daily as needed.      Marland Kitchen atorvastatin (LIPITOR) 20 MG tablet Take 1 tablet (20 mg total) by mouth daily at 6 PM. 90 tablet 1  . Fluticasone-Salmeterol (ADVAIR DISKUS) 250-50 MCG/DOSE AEPB Inhale 1 puff into the lungs every 12 (twelve) hours.     . hydrochlorothiazide (HYDRODIURIL) 25 MG tablet Take 1 tablet (25 mg total) by  mouth daily. 90 tablet 1  . HYDROcodone-acetaminophen (NORCO/VICODIN) 5-325 MG tablet Take 1-2 tablets by mouth every 6 (six) hours as needed. 40 tablet 0  . lisinopril (PRINIVIL,ZESTRIL) 10 MG tablet Take 1 tablet (10 mg total) by mouth daily. 90 tablet 1  . Multiple Vitamin (MULTIVITAMIN) tablet Take 1 tablet by mouth daily.    Marland Kitchen oxybutynin (DITROPAN-XL) 10 MG 24 hr tablet Take 1 tablet (10 mg total) by mouth at bedtime. 90 tablet 1  . valACYclovir (VALTREX) 1000 MG tablet TAKE 1 TABLET (1,000 MG TOTAL) BY MOUTH DAILY. 90 tablet 1  . amoxicillin (AMOXIL) 500 MG capsule Take 1 capsule (500 mg total) by mouth 2 (two) times daily. (Patient not taking: Reported on 08/12/2016) 14 capsule 0  . ipratropium (ATROVENT) 0.02 % nebulizer solution Inhale 0.25 mg into the lungs 4 (four) times daily.   6   No current facility-administered medications on file prior to visit.     Review of Systems:  As per HPI- otherwise negative.   Physical Examination: Vitals:   08/12/16 1528  BP: 110/65  Pulse: 83  Temp: 98.8 F (37.1 C)   Vitals:   08/12/16 1528  Weight: 133 lb (60.3 kg)  Height: 4\' 11"  (1.499 m)   Body mass index is 26.86 kg/m. Ideal Body Weight: Weight in (lb) to have BMI = 25: 123.5  GEN: WDWN, NAD, Non-toxic, A & O x 3, looks well, wearing oxygen [place], sitting in chait HEENT: Atraumatic, Normocephalic. Neck supple. No masses, No LAD. Ears and Nose: No external deformity. CV: RRR, No M/G/R. No JVD. No thrill. No extra heart sounds. PULM: CTA B, no wheezes, crackles, rhonchi. No retractions. No resp. distress. No accessory muscle use. ABD: S, NT, ND EXTR: No c/c/e NEURO using wc today PSYCH: Normally interactive. Conversant. Not depressed or anxious appearing.  Calm demeanor.  No pain with flexion, rotation of right hip.  She does have pain with flexion of left hip but not with ROM.  Adequate strength of both legs. Normal sensation of both legs  Dg Pelvis 1-2 Views  Result Date:  08/12/2016 CLINICAL DATA:  Fracture. EXAM: PELVIS - 1-2 VIEW COMPARISON:  07/06/2016 . FINDINGS: Fractures of the superior and inferior pubic rami on the left noted. These appear unchanged. Degenerative changes lumbar spine and both hips. Calcified pelvic densities noted consistent phleboliths. IMPRESSION: 1. Fractures of the left superior and inferior pubic rami. No change from prior exam. 2. Degenerative changes lumbar spine and both hips. 3. Aortoiliac atherosclerotic vascular disease. Electronically Signed   By: Marcello Moores  Register   On: 08/12/2016 16:21  Assessment and Plan: Muscular deconditioning - Plan: Ambulatory referral to Home Health  Closed fracture of left pubis with routine healing, unspecified portion of pubis, subsequent encounter - Plan: DG Pelvis 1-2 Views, HYDROcodone-acetaminophen (NORCO/VICODIN) 5-325 MG tablet, Ambulatory referral to Home Health  Deficiency anemia - Plan: CBC  Here today to check on her recent pelvic fracture.  She is making progress with decreased pain and greater mobility.  However she is not walking a whole lot at home- this is partially due to pain.  Explained that if she needs to use her pain medication in order to be mobile this is ok.  Would like to get PT involved to work on her strength and balance- will consult them for home treatment for her Refilled pain medication for her today Await repeat CBC today  Meds ordered this encounter  Medications  . HYDROcodone-acetaminophen (NORCO/VICODIN) 5-325 MG tablet    Sig: Take 1-2 tablets by mouth every 6 (six) hours as needed.    Dispense:  40 tablet    Refill:  0    Signed Lamar Blinks, MD  Dg Pelvis 1-2 Views  Result Date: 08/12/2016 CLINICAL DATA:  Fracture. EXAM: PELVIS - 1-2 VIEW COMPARISON:  07/06/2016 . FINDINGS: Fractures of the superior and inferior pubic rami on the left noted. These appear unchanged. Degenerative changes lumbar spine and both hips. Calcified pelvic densities noted  consistent phleboliths. IMPRESSION: 1. Fractures of the left superior and inferior pubic rami. No change from prior exam. 2. Degenerative changes lumbar spine and both hips. 3. Aortoiliac atherosclerotic vascular disease. Electronically Signed   By: Marcello Moores  Register   On: 08/12/2016 16:21   Message to pt about her films.  Fracture is stable but no callus noted.  Clinically she is healing. Recheck in 2-3 weeks

## 2016-08-12 NOTE — Patient Instructions (Signed)
It was great to see you again today- I am glad that you are doing so well!  Please continue to use the pain medication if needed so that you can walk as tolerated.  We are going to set up physical therapy at home to help you build up your strength again Please go downstairs for an x-ray today and I will be in touch with your results asap

## 2016-08-12 NOTE — Progress Notes (Signed)
Pre visit review using our clinic tool,if applicable. No additional management support is needed unless otherwise documented below in the visit note.  

## 2016-08-13 ENCOUNTER — Encounter: Payer: Self-pay | Admitting: Family Medicine

## 2016-08-13 LAB — CBC
HCT: 32.2 % — ABNORMAL LOW (ref 36.0–46.0)
Hemoglobin: 10.7 g/dL — ABNORMAL LOW (ref 12.0–15.0)
MCHC: 33.3 g/dL (ref 30.0–36.0)
MCV: 98.9 fl (ref 78.0–100.0)
PLATELETS: 221 10*3/uL (ref 150.0–400.0)
RBC: 3.26 Mil/uL — AB (ref 3.87–5.11)
RDW: 13.9 % (ref 11.5–15.5)
WBC: 11.1 10*3/uL — ABNORMAL HIGH (ref 4.0–10.5)

## 2016-08-14 ENCOUNTER — Telehealth: Payer: Self-pay | Admitting: Family Medicine

## 2016-08-14 DIAGNOSIS — J439 Emphysema, unspecified: Secondary | ICD-10-CM | POA: Diagnosis not present

## 2016-08-14 DIAGNOSIS — M810 Age-related osteoporosis without current pathological fracture: Secondary | ICD-10-CM | POA: Diagnosis not present

## 2016-08-14 DIAGNOSIS — D539 Nutritional anemia, unspecified: Secondary | ICD-10-CM | POA: Diagnosis not present

## 2016-08-14 DIAGNOSIS — Z87891 Personal history of nicotine dependence: Secondary | ICD-10-CM | POA: Diagnosis not present

## 2016-08-14 DIAGNOSIS — I10 Essential (primary) hypertension: Secondary | ICD-10-CM | POA: Diagnosis not present

## 2016-08-14 DIAGNOSIS — Z9981 Dependence on supplemental oxygen: Secondary | ICD-10-CM | POA: Diagnosis not present

## 2016-08-14 DIAGNOSIS — S32502D Unspecified fracture of left pubis, subsequent encounter for fracture with routine healing: Secondary | ICD-10-CM | POA: Diagnosis not present

## 2016-08-14 DIAGNOSIS — G4700 Insomnia, unspecified: Secondary | ICD-10-CM | POA: Diagnosis not present

## 2016-08-14 DIAGNOSIS — E785 Hyperlipidemia, unspecified: Secondary | ICD-10-CM | POA: Diagnosis not present

## 2016-08-14 DIAGNOSIS — Z9181 History of falling: Secondary | ICD-10-CM | POA: Diagnosis not present

## 2016-08-14 DIAGNOSIS — N3289 Other specified disorders of bladder: Secondary | ICD-10-CM | POA: Diagnosis not present

## 2016-08-14 NOTE — Telephone Encounter (Signed)
Caller name: Angelyn Punt Relationship to patient: [facility] Can be reached: [phone] Pharmacy:  Reason for call: [place] request verbal orders for PT 2 times a week for 4 weeks for Balancing and strengthening. Also wants to inform PCP that patients feet are swollen

## 2016-08-16 ENCOUNTER — Other Ambulatory Visit: Payer: Self-pay | Admitting: Family Medicine

## 2016-08-16 DIAGNOSIS — D649 Anemia, unspecified: Secondary | ICD-10-CM

## 2016-08-16 NOTE — Progress Notes (Signed)
Hemoccult test mailed to pt.

## 2016-08-16 NOTE — Telephone Encounter (Signed)
Called and left a message on identifiable voicemail given verbal order for PT.  Angelyn Punt was encouraged to call me back on my direct line with any questions or concerns.

## 2016-08-16 NOTE — Telephone Encounter (Signed)
PT calling back checking on the status of message below, please advise

## 2016-08-16 NOTE — Progress Notes (Unsigned)
Tanya Rush- please ask the lab to send her a hemoccult test in the mail.  Thank you!

## 2016-08-19 DIAGNOSIS — J439 Emphysema, unspecified: Secondary | ICD-10-CM | POA: Diagnosis not present

## 2016-08-19 DIAGNOSIS — N3289 Other specified disorders of bladder: Secondary | ICD-10-CM | POA: Diagnosis not present

## 2016-08-19 DIAGNOSIS — E785 Hyperlipidemia, unspecified: Secondary | ICD-10-CM | POA: Diagnosis not present

## 2016-08-19 DIAGNOSIS — D539 Nutritional anemia, unspecified: Secondary | ICD-10-CM | POA: Diagnosis not present

## 2016-08-19 DIAGNOSIS — M810 Age-related osteoporosis without current pathological fracture: Secondary | ICD-10-CM | POA: Diagnosis not present

## 2016-08-19 DIAGNOSIS — G4700 Insomnia, unspecified: Secondary | ICD-10-CM | POA: Diagnosis not present

## 2016-08-19 DIAGNOSIS — Z9181 History of falling: Secondary | ICD-10-CM | POA: Diagnosis not present

## 2016-08-19 DIAGNOSIS — Z87891 Personal history of nicotine dependence: Secondary | ICD-10-CM | POA: Diagnosis not present

## 2016-08-19 DIAGNOSIS — I10 Essential (primary) hypertension: Secondary | ICD-10-CM | POA: Diagnosis not present

## 2016-08-19 DIAGNOSIS — S32502D Unspecified fracture of left pubis, subsequent encounter for fracture with routine healing: Secondary | ICD-10-CM | POA: Diagnosis not present

## 2016-08-19 DIAGNOSIS — Z9981 Dependence on supplemental oxygen: Secondary | ICD-10-CM | POA: Diagnosis not present

## 2016-08-21 DIAGNOSIS — J449 Chronic obstructive pulmonary disease, unspecified: Secondary | ICD-10-CM | POA: Diagnosis not present

## 2016-08-30 NOTE — Telephone Encounter (Signed)
Called and spoke with Tanya Rush- the PT came out to see them, but it turned out they were out of network so they did not continue to do this. Tanya Rush however is doing quite well at home and is getting around better, no longer needing her walker all the time.  They wonder if she still needs to do PT anyway.   Asked them to come and see me in the next 1-2 weeks; we will arrange an appt for her

## 2016-09-03 ENCOUNTER — Telehealth: Payer: Self-pay | Admitting: *Deleted

## 2016-09-03 NOTE — Telephone Encounter (Signed)
Husband states pt will make appt on Thursday while she is here.

## 2016-09-05 ENCOUNTER — Ambulatory Visit (INDEPENDENT_AMBULATORY_CARE_PROVIDER_SITE_OTHER): Payer: Medicare Other | Admitting: Family Medicine

## 2016-09-05 VITALS — BP 140/80 | HR 85

## 2016-09-05 DIAGNOSIS — S32502D Unspecified fracture of left pubis, subsequent encounter for fracture with routine healing: Secondary | ICD-10-CM

## 2016-09-05 DIAGNOSIS — I10 Essential (primary) hypertension: Secondary | ICD-10-CM

## 2016-09-05 DIAGNOSIS — R350 Frequency of micturition: Secondary | ICD-10-CM

## 2016-09-05 DIAGNOSIS — R29898 Other symptoms and signs involving the musculoskeletal system: Secondary | ICD-10-CM | POA: Diagnosis not present

## 2016-09-05 MED ORDER — OXYBUTYNIN CHLORIDE ER 10 MG PO TB24: 10.0000 mg | ORAL_TABLET | Freq: Every day | ORAL | 3 refills | Status: DC

## 2016-09-05 NOTE — Progress Notes (Signed)
[place] at [facility] [address] [place], [place] [address] [id number] [phone]  Date:  09/05/2016   Name:  Tanya Rush   DOB:  16-Aug-1945   MRN:  [id number]  PCP:  Penni Homans, MD    Chief Complaint: No chief complaint on file.   History of Present Illness:  Tanya Rush is a 71 y.o. very pleasant female patient who presents with the following:  Last visit was on 08-12-2016, HPI from that visit:  Here today to follow-up from pelvic fragility fracture that occurred following a fall from standing on 06/21/2016.  She was last seen here about one month ago with same- overall she is making good progress.  She is using a WC for leaving the house and is often in the chair at home.  However she is walking in the home some daily, and today showered independently.  She is independent in dressing and grooming.  Her pain is better but she still does have pain with any walking/ other movement.  Admits that she does not want to use her pain medication so she will just hold still to avoid having any pain  No fever, chills, abd pain.  appetite is "so so" but her bowels and bladder are functioning normally.  She notes that her left leg feels weak- this is the side where she has more pain from her fracture.  However no numbness is present  She would be willing to do home PT in order to work on her strength   She does have COPD and uses oxygen at baseline; however prior to her injury she was able to walk a good distance - able to shop for groceries, etc.    Anemia noted on last CBC- will repeat today   Plan from visit on 08-12-2016:  Muscular deconditioning - Plan: Ambulatory referral to [place]  Closed fracture of left pubis with routine healing, unspecified portion of pubis, subsequent encounter - Plan: DG Pelvis 1-2 Views, HYDROcodone-acetaminophen (NORCO/VICODIN) 5-325 MG tablet, Ambulatory referral to Home Health  Deficiency anemia -  Plan: CBC  Here today to check on her recent pelvic fracture.  She is making progress with decreased pain and greater mobility.  However she is not walking a whole lot at home- this is partially due to pain.  Explained that if she needs to use her pain medication in order to be mobile this is ok.  Would like to get PT involved to work on her strength and balance- will consult them for home treatment for her Refilled pain medication for her today Await repeat CBC today      Meds ordered on 08-12-2016  Medications  . HYDROcodone-acetaminophen (NORCO/VICODIN) 5-325 MG tablet    Sig: Take 1-2 tablets by mouth every 6 (six) hours as needed.    Dispense:  40 tablet    Refill:  0    ImagingResults  Dg Pelvis 1-2 Views  Result Date: 08/12/2016 CLINICAL DATA:  Fracture. EXAM: PELVIS - 1-2 VIEW COMPARISON:  07/06/2016 . FINDINGS: Fractures of the superior and inferior pubic rami on the left noted. These appear unchanged. Degenerative changes lumbar spine and both hips. Calcified pelvic densities noted consistent phleboliths. IMPRESSION: 1. Fractures of the left superior and inferior pubic rami. No change from prior exam. 2. Degenerative changes lumbar spine and both hips. 3. Aortoiliac atherosclerotic vascular disease. Electronically Signed   By: Marcello Moores  Register   On: 08/12/2016 16:21  Message to pt about her films.  Fracture is stable but no callus noted.  Clinically she is healing. Recheck in 2-3 weeks    HPI for today's visit: Follow up from pelvic fracture sustained in November.  She is making much more rapid progress now.  She did therapy once, but found that it was "nothing I couldn't do myself" and was expensive so she has just started doing the exercises on her own.     She is doing much better - she is able to get up and walk around the house.  She has not tried shopping yet, but plans to do so soon. They are going out to lunch after visit today her pain is much better- she is  no longer using her pain medications Her BP has also come back up- she is taking both of her BP meds again Her COPD sx are at baseline- she uses her adviar and albuterol as needed Her appetite is good, her bowels are working normally She needs a refill of her oxybutynin for urinary frequency  She has been noted to have low hg since her fracture- this has not improved yet but is stable.  We will plan to repeat this in about 2 months   Patient Active Problem List   Diagnosis Date Noted  . Preventative health care 05/12/2016  . Osteoporosis 05/14/2015  . Unsteady gait 05/14/2015  . Memory impairment 11/13/2014  . Hyperglycemia 05/04/2014  . Medicare annual wellness visit, subsequent 05/04/2014  . Insomnia 05/04/2014  . Overactive bladder 05/04/2014  . Sun-damaged skin 04/04/2013  . Elevated BP 09/28/2012  . Arthralgia 05/03/2012  . Hyperlipidemia, mixed 09/30/2011  . COPD (chronic obstructive pulmonary disease) ([place]) 07/06/2008  . GENITAL HERPES 06/28/2008  . LEG CRAMPS 06/28/2008    Past Medical History:  Diagnosis Date  . Asthma   . COPD (chronic obstructive pulmonary disease) ([place])    severe; O2 dependent-- followed by pulm at [facility]  . Elevated BP 09/28/2012  . Genital herpes   . History of ovarian cyst   . Medicare annual wellness visit, subsequent 05/04/2014  . Memory impairment 11/13/2014  . Osteoporosis 05/14/2015  . Overactive bladder 05/04/2014  . Scabies 01/16/2013  . Sun-damaged skin 04/04/2013  . Unsteady gait 05/14/2015    No past surgical history on file.  Social History  Substance Use Topics  . Smoking status: Former Smoker    Years: 45.00    Quit date: 08/05/2006  . Smokeless tobacco: Never Used  . Alcohol use No    Family History  Problem Relation Age of Onset  . Heart disease Mother   . Stroke Mother   . Arthritis Father     rheumatoid  . Lymphoma Father   . COPD Father     smoker  . Alcohol abuse Brother     TBI  . Birth defects Brother   .  Cancer Neg Hx   . Alcohol abuse Son   . Alcohol abuse Son     recovering x 3 years  . Reye's syndrome Brother   . Birth defects Brother   . Hip fracture Maternal Aunt     Allergies  Allergen Reactions  . Chocolate     Other reaction(s): Unknown  . Aspirin     REACTION: sever chest pain    Medication list has been reviewed and updated.  Current Outpatient Prescriptions on File Prior to Visit  Medication Sig Dispense Refill  . albuterol (PROAIR HFA) 108 (90 BASE) MCG/ACT inhaler  Inhale 2 puffs into the lungs daily as needed.      Marland Kitchen amoxicillin (AMOXIL) 500 MG capsule Take 1 capsule (500 mg total) by mouth 2 (two) times daily. (Patient not taking: Reported on 08/12/2016) 14 capsule 0  . atorvastatin (LIPITOR) 20 MG tablet Take 1 tablet (20 mg total) by mouth daily at 6 PM. 90 tablet 1  . Fluticasone-Salmeterol (ADVAIR DISKUS) 250-50 MCG/DOSE AEPB Inhale 1 puff into the lungs every 12 (twelve) hours.     . hydrochlorothiazide (HYDRODIURIL) 25 MG tablet Take 1 tablet (25 mg total) by mouth daily. 90 tablet 1  . HYDROcodone-acetaminophen (NORCO/VICODIN) 5-325 MG tablet Take 1-2 tablets by mouth every 6 (six) hours as needed. 40 tablet 0  . ipratropium (ATROVENT) 0.02 % nebulizer solution Inhale 0.25 mg into the lungs 4 (four) times daily.   6  . lisinopril (PRINIVIL,ZESTRIL) 10 MG tablet Take 1 tablet (10 mg total) by mouth daily. 90 tablet 1  . Multiple Vitamin (MULTIVITAMIN) tablet Take 1 tablet by mouth daily.    Marland Kitchen oxybutynin (DITROPAN-XL) 10 MG 24 hr tablet Take 1 tablet (10 mg total) by mouth at bedtime. 90 tablet 1  . valACYclovir (VALTREX) 1000 MG tablet TAKE 1 TABLET (1,000 MG TOTAL) BY MOUTH DAILY. 90 tablet 1   No current facility-administered medications on file prior to visit.     Review of Systems:  As per HPI- otherwise negative. She is back on both of her BP meds now and is tolerating them well  Physical Examination: Vitals:   09/05/16 1401  BP: 140/80  Pulse: 85    There were no vitals filed for this visit. There is no height or weight on file to calculate BMI. Ideal Body Weight:    GEN: WDWN, NAD, Non-toxic, A & O x 3, looks well, wearing her O2 with [place] HEENT: Atraumatic, Normocephalic. Neck supple. No masses, No LAD. Ears and Nose: No external deformity. CV: RRR, No M/G/R. No JVD. No thrill. No extra heart sounds. PULM: CTA B, no wheezes, crackles, rhonchi. No retractions. No resp. distress. No accessory muscle use. ABD: S, NT, ND EXTR: No c/c/e NEURO slow, favors left side a bit but is doing much better than she was  PSYCH: Normally interactive. Conversant. Not depressed or anxious appearing.  Calm demeanor.  Able to internally and externally rotate and flex both hips without pain   Assessment and Plan: Closed fracture of left pubis with routine healing, unspecified portion of pubis, subsequent encounter  Increased frequency of urination - Plan: oxybutynin (DITROPAN-XL) 10 MG 24 hr tablet  Muscular deconditioning  Hypertension, unspecified type  Here today for a recheck visit from pelvic fracture sustained in November She is making much faster progress now- is up and walking today Refilled medication for her bladder She will see me in 2 months to repeat her CBC She will continue to work on strengthening   Signed Lamar Blinks, MD

## 2016-09-05 NOTE — Patient Instructions (Addendum)
It is great to see you doing so well- your fracture seems to have done well!   Your BP looks fine today  Let's recheck in 2 months- let me know if any problems in the meantime !

## 2016-09-21 DIAGNOSIS — J449 Chronic obstructive pulmonary disease, unspecified: Secondary | ICD-10-CM | POA: Diagnosis not present

## 2016-10-16 DIAGNOSIS — J449 Chronic obstructive pulmonary disease, unspecified: Secondary | ICD-10-CM | POA: Diagnosis not present

## 2016-10-16 DIAGNOSIS — R0902 Hypoxemia: Secondary | ICD-10-CM | POA: Diagnosis not present

## 2016-10-19 DIAGNOSIS — J449 Chronic obstructive pulmonary disease, unspecified: Secondary | ICD-10-CM | POA: Diagnosis not present

## 2016-10-29 ENCOUNTER — Other Ambulatory Visit: Payer: Self-pay | Admitting: Family Medicine

## 2016-10-30 ENCOUNTER — Ambulatory Visit: Payer: Medicare Other | Admitting: Family Medicine

## 2016-10-31 ENCOUNTER — Ambulatory Visit (HOSPITAL_BASED_OUTPATIENT_CLINIC_OR_DEPARTMENT_OTHER)
Admission: RE | Admit: 2016-10-31 | Discharge: 2016-10-31 | Disposition: A | Discharge status: Home / Self Care | Payer: Medicare Other | Source: Ambulatory Visit | Attending: Family Medicine | Admitting: Family Medicine

## 2016-10-31 ENCOUNTER — Ambulatory Visit (INDEPENDENT_AMBULATORY_CARE_PROVIDER_SITE_OTHER): Payer: Medicare Other | Admitting: Family Medicine

## 2016-10-31 VITALS — BP 124/76 | HR 84 | Temp 98.0°F | Ht 59.0 in | Wt 132.0 lb

## 2016-10-31 DIAGNOSIS — M25552 Pain in left hip: Secondary | ICD-10-CM | POA: Insufficient documentation

## 2016-10-31 DIAGNOSIS — X58XXXD Exposure to other specified factors, subsequent encounter: Secondary | ICD-10-CM | POA: Insufficient documentation

## 2016-10-31 DIAGNOSIS — S32592D Other specified fracture of left pubis, subsequent encounter for fracture with routine healing: Secondary | ICD-10-CM

## 2016-10-31 DIAGNOSIS — S32512A Fracture of superior rim of left pubis, initial encounter for closed fracture: Secondary | ICD-10-CM | POA: Diagnosis not present

## 2016-10-31 DIAGNOSIS — S32502D Unspecified fracture of left pubis, subsequent encounter for fracture with routine healing: Secondary | ICD-10-CM | POA: Diagnosis not present

## 2016-10-31 DIAGNOSIS — M8580 Other specified disorders of bone density and structure, unspecified site: Secondary | ICD-10-CM | POA: Diagnosis not present

## 2016-10-31 MED ORDER — HYDROCODONE-ACETAMINOPHEN 5-325 MG PO TABS: 1.0000 | ORAL_TABLET | Freq: Four times a day (QID) | ORAL | 0 refills | Status: DC | PRN

## 2016-10-31 NOTE — Progress Notes (Signed)
[facility] [address], [place], [place] [address] [phone]  Date:  10/31/2016   Name:  Tanya Rush   DOB:  [birth year]   MRN:  [id number]  PCP:  Penni Homans, MD    Chief Complaint: Back Pain (c/o back and pelvic pain x week and a half. Pt states that pain has worsen. )   History of Present Illness:  Tanya Rush is a 71 y.o. very pleasant female patient who presents with the following:  Here today for a follow-up visit for a pelvic fragility fracture I first saw this pt on 07/03/16; she had been seen at [facility] on 11/17 following a fall.  CT revealed fractures of the left superior and inferior pubic rami.  We had treated her conservatively with pain control and graded return to activity with good resutls  Last visit here on 2/1- HPI from that visit  HPI for today's visit: Follow up from pelvic fracture sustained in November.  She is making much more rapid progress now. She did therapy once, but found that it was "nothing I couldn't do myself" and was expensive so she has just started doing the exercises on her own.     She is doing much better - she is able to get up and walk around the house.  She has not tried shopping yet, but plans to do so soon. They are going out to lunch after visit today Her pain is much better- she is no longer using her pain medications Her BP has also come back up- she is taking both of her BP meds again Her COPD sx are at baseline- she uses her adviar and albuterol as needed Her appetite is good, her bowels are working normally She needs a refill of her oxybutynin for urinary frequency  She has been noted to have low hg since her fracture- this has not improved yet but is stable.  We will plan to repeat this in about 2 months   Since her last visit she notes that her left hip started to hurt more- the pain seems to be going into her back now as well.  She noted this over the  last week or so She has started using a little of her hydrocodone again - maybe a 1/2 pill at a time. No further falls- she is not aware of any injury that worsened her pain- she did not sit down too hard, etc.  Her activity level had been getting back to normal- she was back to cooking, doing laundry, etc.  She is still trying to be active around the house but increased pain is limiting her  She is otherwise feeling well- no fevers or chills She is glad that the weather is getting warmer as she hopes it will decrease her pain and also help her breathing.  She is dependent on oxygen at her usual level   Patient Active Problem List   Diagnosis Date Noted  . Preventative health care 05/12/2016  . Osteoporosis 05/14/2015  . Unsteady gait 05/14/2015  . Memory impairment 11/13/2014  . Hyperglycemia 05/04/2014  . Medicare annual wellness visit, subsequent 05/04/2014  . Insomnia 05/04/2014  . Overactive bladder 05/04/2014  . Sun-damaged skin 04/04/2013  . Elevated BP 09/28/2012  . Arthralgia 05/03/2012  . Hyperlipidemia, mixed 09/30/2011  . COPD (chronic obstructive pulmonary disease) ([place]) 07/06/2008  . GENITAL HERPES 06/28/2008  . LEG CRAMPS 06/28/2008  Past Medical History:  Diagnosis Date  . Asthma   . COPD (chronic obstructive pulmonary disease) ([place])    severe; O2 dependent-- followed by pulm at [facility]  . Elevated BP 09/28/2012  . Genital herpes   . History of ovarian cyst   . Medicare annual wellness visit, subsequent 05/04/2014  . Memory impairment 11/13/2014  . Osteoporosis 05/14/2015  . Overactive bladder 05/04/2014  . Scabies 01/16/2013  . Sun-damaged skin 04/04/2013  . Unsteady gait 05/14/2015    No past surgical history on file.  Social History  Substance Use Topics  . Smoking status: Former Smoker    Years: 45.00    Quit date: 08/05/2006  . Smokeless tobacco: Never Used  . Alcohol use No    Family History  Problem Relation Age of Onset  . Heart disease Mother    . Stroke Mother   . Arthritis Father     rheumatoid  . Lymphoma Father   . COPD Father     smoker  . Alcohol abuse Brother     TBI  . Birth defects Brother   . Cancer Neg Hx   . Alcohol abuse Son   . Alcohol abuse Son     recovering x 3 years  . Reye's syndrome Brother   . Birth defects Brother   . Hip fracture Maternal Aunt     Allergies  Allergen Reactions  . Chocolate     Other reaction(s): Unknown  . Aspirin     REACTION: sever chest pain    Medication list has been reviewed and updated.  Current Outpatient Prescriptions on File Prior to Visit  Medication Sig Dispense Refill  . albuterol (PROAIR HFA) 108 (90 BASE) MCG/ACT inhaler Inhale 2 puffs into the lungs daily as needed.      Marland Kitchen atorvastatin (LIPITOR) 20 MG tablet Take 1 tablet (20 mg total) by mouth daily at 6 PM. 90 tablet 1  . Fluticasone-Salmeterol (ADVAIR DISKUS) 250-50 MCG/DOSE AEPB Inhale 1 puff into the lungs every 12 (twelve) hours.     . hydrochlorothiazide (HYDRODIURIL) 25 MG tablet TAKE 1 TABLET (25 MG TOTAL) BY MOUTH DAILY. 90 tablet 1  . lisinopril (PRINIVIL,ZESTRIL) 10 MG tablet Take 1 tablet (10 mg total) by mouth daily. 90 tablet 1  . Multiple Vitamin (MULTIVITAMIN) tablet Take 1 tablet by mouth daily.    Marland Kitchen oxybutynin (DITROPAN-XL) 10 MG 24 hr tablet Take 1 tablet (10 mg total) by mouth at bedtime. 90 tablet 3  . valACYclovir (VALTREX) 1000 MG tablet TAKE 1 TABLET (1,000 MG TOTAL) BY MOUTH DAILY. 90 tablet 1   No current facility-administered medications on file prior to visit.     Review of Systems:  As per HPI- otherwise negative. No fever or chills No rash, CP or SOB  Physical Examination: Vitals:   10/31/16 1546  BP: 124/76  Pulse: 84  Temp: 98 F (36.7 C)   Vitals:   10/31/16 1546  Weight: 132 lb (59.9 kg)  Height: 4\' 11"  (1.499 m)   Body mass index is 26.66 kg/m. Ideal Body Weight: Weight in (lb) to have BMI = 25: 123.5  GEN: WDWN, NAD, Non-toxic, A & O x 3, appears her  normal self but her gait is more antalgic again Wearing O2 via nasal canula  HEENT: Atraumatic, Normocephalic. Neck supple. No masses, No LAD. Ears and Nose: No external deformity. CV: RRR, No M/G/R. No JVD. No thrill. No extra heart sounds. PULM: CTA B, no wheezes, crackles, rhonchi. No retractions. No  resp. distress. No accessory muscle use. ABD: S, NT, ND Back/ left hip: she has normal ROM of the left hip, mild tenderness with flexion of the hip, more with internal and external rotation of the hip.  Normal strength and sensation of both legs.  She is also tender to pressure over the posterior left pelvis EXTR: No c/c/e NEURO slow,  PSYCH: Normally interactive. Conversant. Not depressed or anxious appearing.  Calm demeanor.   Dg Hip Unilat W Or W/o Pelvis 2-3 Views Left  Result Date: 10/31/2016 CLINICAL DATA:  Fracture EXAM: DG HIP (WITH OR WITHOUT PELVIS) 2-3V LEFT COMPARISON:  None. FINDINGS: There are fractures in the left sacral ala, left superior pubic ramus, and left inferior pubic ramus. Alignment is stable compare to the prior study. There are sclerotic changes about the fracture site which is also stable. No new fracture. Stable osteopenia. IMPRESSION: Stable fractures involving the left side of the sacrum, left superior pubic ramus, and left inferior pubic ramus. Electronically Signed   By: Marybelle Killings M.D.   On: 10/31/2016 17:06     Assessment and Plan: Pubic ramus fracture, left, with routine healing, subsequent encounter - Plan: DG Pelvis 1-2 Views  Pain in left hip - Plan: DG HIP UNILAT W OR W/O PELVIS 2-3 VIEWS LEFT, DG Pelvis 1-2 Views  Closed fracture of left pubis with routine healing, unspecified portion of pubis, subsequent encounter - Plan: HYDROcodone-acetaminophen (NORCO/VICODIN) 5-325 MG tablet  Here today to follow-up on left pubic rami fracture (pelvic fragility fracture, she fell from standing) that occurred in [facility] November.  At our last visit about 2 months ago  she was doing very well clinically and her pain was rapidly improving.  However as of the last week she has noted more pain again, and is requesting a refill of her pain medication today.  Her new x-rays from today also mention a sacral fracture that was not noted on past CT or plain film.  Called pt to discuss- I am not able to get films to pull up for me currently so cannot view myself.  Will call in am and discuss with radiologist and plan next step.  She states agreement with plan  Meds ordered this encounter  Medications  . HYDROcodone-acetaminophen (NORCO/VICODIN) 5-325 MG tablet    Sig: Take 1 tablet by mouth every 6 (six) hours as needed.    Dispense:  30 tablet    Refill:  0    Signed Lamar Blinks, MD addnd 3/30 Called and spoke with Dr. Barbie Banner today about her repeat pelvic films.  Her pubic rami fractures are stable but she now clearly also has a left sided sacral ala fracture.  Suspect this was present before but was not apparent as she denies any new injury.  She did have a CT of her left hip as well in [facility] November which did not mention this sacral fracture.    Discussed with pt and her husband- as we now that she she has both anterior and posterior pelvic fractures, and her pain has worsened, I am going to refer her to orthopedics.  They would like to be seen in [place] or [facility]; will arrange for them next week.  For the time being continue pain control and activity as tolerated. They will call me if any concerns

## 2016-11-04 ENCOUNTER — Telehealth: Payer: Self-pay | Admitting: Family Medicine

## 2016-11-04 DIAGNOSIS — S32592G Other specified fracture of left pubis, subsequent encounter for fracture with delayed healing: Secondary | ICD-10-CM

## 2016-11-04 NOTE — Telephone Encounter (Signed)
Referral placed.

## 2016-11-06 ENCOUNTER — Telehealth: Payer: Self-pay | Admitting: Family Medicine

## 2016-11-19 DIAGNOSIS — J449 Chronic obstructive pulmonary disease, unspecified: Secondary | ICD-10-CM | POA: Diagnosis not present

## 2016-11-22 ENCOUNTER — Other Ambulatory Visit (INDEPENDENT_AMBULATORY_CARE_PROVIDER_SITE_OTHER): Payer: Self-pay

## 2016-11-22 DIAGNOSIS — M25552 Pain in left hip: Secondary | ICD-10-CM

## 2016-11-25 ENCOUNTER — Other Ambulatory Visit (INDEPENDENT_AMBULATORY_CARE_PROVIDER_SITE_OTHER): Payer: Self-pay | Admitting: Orthopaedic Surgery

## 2016-11-25 ENCOUNTER — Ambulatory Visit (HOSPITAL_BASED_OUTPATIENT_CLINIC_OR_DEPARTMENT_OTHER)
Admission: RE | Admit: 2016-11-25 | Discharge: 2016-11-25 | Disposition: A | Discharge status: Home / Self Care | Payer: Medicare Other | Source: Ambulatory Visit | Attending: Orthopaedic Surgery | Admitting: Orthopaedic Surgery

## 2016-11-25 ENCOUNTER — Ambulatory Visit (INDEPENDENT_AMBULATORY_CARE_PROVIDER_SITE_OTHER): Payer: Medicare Other | Admitting: Orthopaedic Surgery

## 2016-11-25 DIAGNOSIS — S32512G Fracture of superior rim of left pubis, subsequent encounter for fracture with delayed healing: Secondary | ICD-10-CM

## 2016-11-25 DIAGNOSIS — X58XXXD Exposure to other specified factors, subsequent encounter: Secondary | ICD-10-CM | POA: Diagnosis not present

## 2016-11-25 DIAGNOSIS — G8929 Other chronic pain: Secondary | ICD-10-CM | POA: Diagnosis not present

## 2016-11-25 DIAGNOSIS — S32592D Other specified fracture of left pubis, subsequent encounter for fracture with routine healing: Secondary | ICD-10-CM | POA: Insufficient documentation

## 2016-11-25 DIAGNOSIS — M25552 Pain in left hip: Secondary | ICD-10-CM

## 2016-11-25 DIAGNOSIS — M25511 Pain in right shoulder: Secondary | ICD-10-CM | POA: Diagnosis not present

## 2016-11-25 MED ORDER — LIDOCAINE HCL 1 % IJ SOLN
3.0000 mL | INTRAMUSCULAR | Status: AC | PRN
  Administered 2016-11-25: 3 mL

## 2016-11-25 MED ORDER — METHYLPREDNISOLONE ACETATE 40 MG/ML IJ SUSP
40.0000 mg | INTRAMUSCULAR | Status: AC | PRN
  Administered 2016-11-25: 40 mg via INTRA_ARTICULAR

## 2016-11-25 NOTE — Progress Notes (Signed)
Office Visit Note   Patient: Tanya Rush           Date of Birth: 07-Sep-1945           MRN: [id number] Visit Date: 11/25/2016              Requested by: Darreld Mclean, MD [address] [place], [place] [address] PCP: Penni Homans, MD   Assessment & Plan: Visit Diagnoses:  1. Chronic right shoulder pain   2. Fracture of superior rim of left pubis, subsequent encounter for fracture with delayed healing     Plan: She definitely tolerated the steroid injection well and her left shoulder and we'll also try only this for now. My next step would be physical therapy. Given the severity of her COPD she would not be a good surgical candidate   Follow-Up Instructions: Return in about 4 weeks (around 12/23/2016).   Orders:  Orders Placed This Encounter  Procedures  . Large Joint Injection/Arthrocentesis   No orders of the defined types were placed in this encounter.     Procedures: Large Joint Inj Date/Time: 11/25/2016 10:41 AM Performed by: Mcarthur Rossetti Authorized by: Mcarthur Rossetti   Location:  Shoulder Site:  L subacromial bursa Ultrasound Guidance: No   Fluoroscopic Guidance: No   Arthrogram: No   Medications:  3 mL lidocaine 1 %; 40 mg methylPREDNISolone acetate 40 MG/ML     Clinical Data: No additional findings.   Subjective: No chief complaint on file. The patient comes originally referred due to pelvic fractures that she sustained at her left sacrum in her left pubis back after a fall in November. This is been slowly healing and causing some pain. She is on chronic oxygen due to COPD. She's been having the left shoulder pain and weakness and pain with overhead activities as well as inability to lift her arm overhead. It wakes her up at night and is been significantly weak and painful to her in terms of her left shoulder. She would like this evaluated today as well.  HPI  Review of Systems He does report shortness of breath  due to chronic COPD and she is on oxygen. She denies any chest pain, nausea, fever, chills.  Objective: Vital Signs: There were no vitals taken for this visit.  Physical Exam Alert and oriented 3 and in no acute distress and on her oxygen Ortho Exam Examination of her left shoulder does show deficits in the rotator cuff with significant weakness with abduction and external rotation. Shoulder significant a painful around the subacromial outlet. She tolerates me easily putting her left hip to internal rotation rotation as well as flexion extension has minimal pain around the pubis. Specialty Comments:  No specialty comments available.  Imaging: Dg Hip Unilat With Pelvis 1v Left  Result Date: 11/25/2016 CLINICAL DATA:  Left hip pain for 1 week, history of prior pelvic fracture EXAM: DG HIP (WITH OR WITHOUT PELVIS) 1V*L* COMPARISON:  10/31/2016 FINDINGS: The previously seen superior and inferior pubic ramus fractures on the left are again noted with evidence of callus formation and healing. Increased sclerosis in the left sacrum is noted consistent with a prior fracture as well. No new fracture or dislocation is seen. No gross soft tissue abnormality is noted. IMPRESSION: Chronic healed fractures on the left. Electronically Signed   By: Inez Catalina M.D.   On: 11/25/2016 09:55   I independently reviewed the hip/pelvis x-rays and see healed fractures of the  left superior and inferior ramus.  PMFS History: Patient Active Problem List   Diagnosis Date Noted  . Chronic right shoulder pain 11/25/2016  . Fracture of superior rim of left pubis, subsequent encounter for fracture with delayed healing 11/25/2016  . Preventative health care 05/12/2016  . Osteoporosis 05/14/2015  . Unsteady gait 05/14/2015  . Memory impairment 11/13/2014  . Hyperglycemia 05/04/2014  . Medicare annual wellness visit, subsequent 05/04/2014  . Insomnia 05/04/2014  . Overactive bladder 05/04/2014  . Sun-damaged skin  04/04/2013  . Elevated BP 09/28/2012  . Arthralgia 05/03/2012  . Hyperlipidemia, mixed 09/30/2011  . COPD (chronic obstructive pulmonary disease) ([place]) 07/06/2008  . GENITAL HERPES 06/28/2008  . LEG CRAMPS 06/28/2008   Past Medical History:  Diagnosis Date  . Asthma   . COPD (chronic obstructive pulmonary disease) ([place])    severe; O2 dependent-- followed by pulm at [facility]  . Elevated BP 09/28/2012  . Genital herpes   . History of ovarian cyst   . Medicare annual wellness visit, subsequent 05/04/2014  . Memory impairment 11/13/2014  . Osteoporosis 05/14/2015  . Overactive bladder 05/04/2014  . Scabies 01/16/2013  . Sun-damaged skin 04/04/2013  . Unsteady gait 05/14/2015    Family History  Problem Relation Age of Onset  . Heart disease Mother   . Stroke Mother   . Arthritis Father     rheumatoid  . Lymphoma Father   . COPD Father     smoker  . Alcohol abuse Brother     TBI  . Birth defects Brother   . Cancer Neg Hx   . Alcohol abuse Son   . Alcohol abuse Son     recovering x 3 years  . Reye's syndrome Brother   . Birth defects Brother   . Hip fracture Maternal Aunt     No past surgical history on file. Social History   Occupational History  . Not on file.   Social History Main Topics  . Smoking status: Former Smoker    Years: 45.00    Quit date: 08/05/2006  . Smokeless tobacco: Never Used  . Alcohol use No  . Drug use: No  . Sexual activity: No     Comment: lives with husband, no dietary restrictions

## 2016-11-27 NOTE — Telephone Encounter (Signed)
error 

## 2016-12-19 DIAGNOSIS — J449 Chronic obstructive pulmonary disease, unspecified: Secondary | ICD-10-CM | POA: Diagnosis not present

## 2016-12-23 ENCOUNTER — Other Ambulatory Visit: Payer: Self-pay | Admitting: Family Medicine

## 2016-12-23 ENCOUNTER — Ambulatory Visit (INDEPENDENT_AMBULATORY_CARE_PROVIDER_SITE_OTHER): Payer: Medicare Other | Admitting: Orthopaedic Surgery

## 2016-12-23 DIAGNOSIS — M25511 Pain in right shoulder: Secondary | ICD-10-CM

## 2016-12-23 DIAGNOSIS — G8929 Other chronic pain: Secondary | ICD-10-CM | POA: Diagnosis not present

## 2016-12-23 DIAGNOSIS — M25512 Pain in left shoulder: Secondary | ICD-10-CM

## 2016-12-23 NOTE — Progress Notes (Signed)
The patient is following up for her left and right shoulder pain. The injection we placed her left shoulder helped for about 7 days but her pain is back. She has severe chronic pain unfortunately. She is also on chronic oxygen therapy. She would not tolerate surgery due to her medical issues. She's not been to formal physical therapy.  On examination I can move both shoulders easily but they're very painful to her with left being worse than the right. There is deathly a signed of impingement syndrome and some rotator cuff deficits.  I gave her prescription for outpatient physical therapy at [facility]. Hopefully they can work on getting her pain to decrease performing soft tissue massage, iontophoresis and motor pheresis as well as considering dispensing a TENS unit. I did give her a prescription for hydrocodone to take sparingly. She can't take anti-inflammatories which she'll take as well. We'll see her back in our [place] office in 4-5 weeks.

## 2017-01-01 ENCOUNTER — Ambulatory Visit (INDEPENDENT_AMBULATORY_CARE_PROVIDER_SITE_OTHER): Payer: Medicare Other | Admitting: Physical Therapy

## 2017-01-01 ENCOUNTER — Encounter: Payer: Self-pay | Admitting: Physical Therapy

## 2017-01-01 DIAGNOSIS — M25512 Pain in left shoulder: Secondary | ICD-10-CM | POA: Diagnosis not present

## 2017-01-01 DIAGNOSIS — G8929 Other chronic pain: Secondary | ICD-10-CM | POA: Diagnosis not present

## 2017-01-01 DIAGNOSIS — M6281 Muscle weakness (generalized): Secondary | ICD-10-CM

## 2017-01-01 DIAGNOSIS — R293 Abnormal posture: Secondary | ICD-10-CM

## 2017-01-01 DIAGNOSIS — M25531 Pain in right wrist: Secondary | ICD-10-CM | POA: Diagnosis not present

## 2017-01-01 DIAGNOSIS — M25511 Pain in right shoulder: Secondary | ICD-10-CM

## 2017-01-01 NOTE — Patient Instructions (Addendum)
Axial Extension (Chin Tuck)   Pull chin in and lengthen back of neck. Hold _2-3___ seconds while counting out loud. Repeat _10_-15__ times. Do __2__ sessions per day.   Scapular Retraction (Standing)   With arms at sides, pinch shoulder blades together. Repeat _10___ times per set. Do __2__ sets per session. Do __2__ sessions per day.  Resisted External Rotation: in Neutral - Bilateral, stand or sit tall ( pretend you are 2 inches taller than you are   Sit or stand, tubing in both hands, elbows at sides, bent to 90, forearms forward. Pinch shoulder blades together and rotate forearms out. Keep elbows at sides. Repeat __10__ times per set. Do _2___ sets per session. Do _1-2___ sessions per day. FOCUS on squeezing the shoulder blades and not bringing the hands out.   Low Row: Standing   Face anchor, feet shoulder width apart. Palms up, pull arms back, squeezing shoulder blades together. Repeat _10_ times per set. Do _2_ sets per session. Do _3-5_ sessions per week. Anchor Height: Waist  AROM: Wrist Flexion / Extension    Actively bend right wrist forward then back as far as possible. Repeat _10-15___ times per set. Do _2___ sets per session. Do __2__ sessions per day.  Wrist Flexor Stretch    Keeping elbow straight, grasp left hand and slowly bend wrist back until stretch is felt. Hold _20-30___ seconds.  Don't push into pain. Relax. Repeat __2__ times per set. Do _1___ sets per session. Do __2__ sessions per day.  Wrist Extensor Stretch    Keeping elbow straight, grasp left hand and slowly bend wrist forward until stretch is felt. Hold __20-30__ seconds. Relax. Repeat __2__ times per set. Do __1__ sets per session. Do __1__ sessions per day.  Copyright  VHI. All rights reserved.

## 2017-01-01 NOTE — Therapy (Signed)
[place], [place], [address] Phone: [phone]   Fax:  [phone]  Physical Therapy Evaluation  Patient Details  Name: Tanya Rush MRN: [id number] Date of Birth: 06-Mar-1946 Referring Provider: Dr Jean Rosenthal  Encounter Date: 01/01/2017      PT End of Session - 01/01/17 1654    Visit Number 1   Number of Visits 6   Date for PT Re-Evaluation 02/12/17   PT Start Time 1602   PT Stop Time 7062   PT Time Calculation (min) 52 min      Past Medical History:  Diagnosis Date  . Asthma   . COPD (chronic obstructive pulmonary disease) ([place])    severe; O2 dependent-- followed by pulm at [facility]  . Elevated BP 09/28/2012  . Genital herpes   . History of ovarian cyst   . Medicare annual wellness visit, subsequent 05/04/2014  . Memory impairment 11/13/2014  . Osteoporosis 05/14/2015  . Overactive bladder 05/04/2014  . Scabies 01/16/2013  . Sun-damaged skin 04/04/2013  . Unsteady gait 05/14/2015    History reviewed. No pertinent surgical history.  There were no vitals filed for this visit.       Subjective Assessment - 01/01/17 1559    Subjective Pt reports she has chronic shoulder pain and Rt hand pain, she understands that the order is for her shoulders. The shoulders hurt for the last 6 weeks, hand for 4 weeks,  She is dropping items also.    Pertinent History severe COPD on oxygen, pelvic fx 11/17 with poor healing.    How long can you walk comfortably? house hold ambulation without assistive device.    Diagnostic tests not sure   Patient Stated Goals get rid of the pain, perform simple house hold tasks, make bed, cook, use computer, perform ADLs - hold hair dryer   Currently in Pain? Yes  only has shoulder pain with reaching up.    Pain Score 8    Pain Location Hand   Pain Orientation Right  ulnar side and across the dorsum.    Pain Descriptors / Indicators Sharp   Pain Type Acute  pain   Pain Onset 1 to 4 weeks ago   Aggravating Factors  hand worse with finger extension and making a fist, shoulders worse with elevation    Pain Relieving Factors hand improved if she doesn't use it, running it under hot water, shoulder better with rest and not elevating.             [facility] PT Assessment - 01/01/17 0001      Assessment   Medical Diagnosis chronic bilat shoulder pain and Rt wrist pain   Referring Provider Dr Jean Rosenthal   Onset Date/Surgical Date 11/20/16   Hand Dominance Right   Next MD Visit PRN   Prior Therapy not for this.      Precautions   Precautions None     Balance Screen   Has the patient fallen in the past 6 months No     [place] --  mobile home, able to get in/out fine   Additional Comments with very supportive spouse     Prior Function   Level of Independence Other (comment);Needs assistance with homemaking;Needs assistance with ADLs  help with drying hair   Vocation Retired   [employer] used to work in a call center.    Leisure play on the computer.      Observation/Other  Assessments   Focus on Therapeutic Outcomes (FOTO)  62% limited     Observation/Other Assessments-Edema    Edema --  (+) in Rt wrist     Posture/Postural Control   Posture/Postural Control Postural limitations   Postural Limitations Forward head;Rounded Shoulders;Increased thoracic kyphosis  elevated Lt shoulder complex     ROM / Strength   AROM / PROM / Strength AROM;Strength     AROM   AROM Assessment Site Shoulder;Cervical;Elbow;Finger;Wrist   Right/Left Shoulder Left;Right  compensatory motion using the upper trap    Right Shoulder Extension 72 Degrees   Right Shoulder Flexion 138 Degrees   Right Shoulder ABduction 105 Degrees  with pain   Right Shoulder Internal Rotation 82 Degrees   Right Shoulder External Rotation 78 Degrees   Left Shoulder Extension 65 Degrees   Left Shoulder Flexion 55 Degrees   with pain   Left Shoulder ABduction 91 Degrees  pain    Left Shoulder Internal Rotation 45 Degrees  pain   Left Shoulder External Rotation 75 Degrees  with pain   Right/Left Wrist Right  Lt WNL   Right Wrist Extension 30 Degrees   Right Wrist Flexion 62 Degrees   Cervical Flexion WNL   Cervical Extension 44 with neck pain   Cervical - Right Rotation 50 with neck pain   Cervical - Left Rotation 39 with neck pain     Strength   Strength Assessment Site Shoulder;Elbow;Wrist;Hand   Right/Left Shoulder --  limited due to pain   Right/Left Elbow --  bilat WNL   Right/Left Wrist --  Lt WNl, Rt grossly 4/5 with pain   Right/Left hand --  Rt grip 50% less then Lt.      Flexibility   Soft Tissue Assessment /Muscle Length --  very tight in her pecs/upper traps & neck     Palpation   Spinal mobility NA osteoporosis            Objective measurements completed on examination: See above findings.          [facility] Adult PT Treatment/Exercise - 01/01/17 0001      Exercises   Exercises Shoulder     Shoulder Exercises: Seated   Other Seated Exercises Rt wrist AROM, stretch into flex/ext, VC for form, very limited in extension.      Shoulder Exercises: Standing   Retraction Both;12 reps;Other (comment)  with and without band   Theraband Level (Shoulder Retraction) Level 1 (Yellow)   Retraction Limitations cervical retraction x 10 reps, VC and physical cues for both retractions.                 PT Education - 01/01/17 1657    Education provided Yes   Education Details HEP and initial posture   Person(s) Educated Patient;Spouse   Methods Explanation;Demonstration;Handout   Comprehension Returned demonstration;Verbalized understanding;Verbal cues required;Need further instruction             PT Long Term Goals - 01/01/17 1701      PT LONG TERM GOAL #1   Title I with advanced HEP for postural correction ( 02/12/17)    Time 6   Period Weeks   Status  New     PT LONG TERM GOAL #2   Title report reduction of overall upper body pain =/> 75% with daily activity. ( 02/12/17)    Time 6   Period Weeks   Status New     PT LONG TERM GOAL #3   Title improve bilat  shoulder ROM to [facility] to allow her to reach overhead without increased pain per her previous level ( 02/12/17)    Time 6   Period Weeks   Status New     PT LONG TERM GOAL #4   Title improve Rt wrist ROM and pain to allow her to hold on to objects per her previous level ( 02/12/17)    Time 6   Period Weeks   Status New     PT LONG TERM GOAL #5   Title improve FOTO =/< 38% limited, CJ level ( 02/12/17)    Time 6   Period Weeks   Status New                Plan - 2017/01/11 1657    Clinical Impression Statement 71 yo female presents for moderate complexity PT eval for bilat shoulder pain and Rt wrist pain.  she states the shoulder pain began around 6 weeks ago and the wrist about 4 weeks.  She has osteoporosis, chronic COPD - on continous oxygen , Lt pubic fx and some memory issues. Her shoulder ROM is limited and painful as is her cervical motion and Rt wrist.  She has weakness in her upper body and significant postureal changes.    Clinical Presentation Evolving   Clinical Decision Making Moderate   Rehab Potential Good   PT Frequency 1x / week  per patient request due to financial status.    PT Duration 6 weeks   PT Treatment/Interventions Moist Heat;Ultrasound;Therapeutic exercise;Dry needling;Taping;Manual techniques;Vasopneumatic Device;Neuromuscular re-education;Cryotherapy;Electrical Stimulation;Iontophoresis 4mg /ml Dexamethasone;Patient/family education;Passive range of motion   PT Next Visit Plan postural re-ed, may benefit from DN and manual work.    Consulted and Agree with Plan of Care Patient;Family member/caregiver   Family Member Consulted spouse      Patient will benefit from skilled therapeutic intervention in order to improve the following deficits and  impairments:  Decreased range of motion, Impaired UE functional use, Increased muscle spasms, Pain, Decreased strength, Increased edema, Postural dysfunction  Visit Diagnosis: Chronic pain of both shoulders - Plan: PT plan of care cert/re-cert  Acute wrist pain, right - Plan: PT plan of care cert/re-cert  Abnormal posture - Plan: PT plan of care cert/re-cert  Muscle weakness (generalized) - Plan: PT plan of care cert/re-cert      [facility] PT PB G-CODES - 01/11/17 1706    Functional Assessment Tool Used  FOTO and professional judgement    Functional Limitations Other PT primary   Other PT Primary Current Status [id number]) At least 60 percent but less than 80 percent impaired, limited or restricted   Other PT Primary Goal Status (D6222) At least 20 percent but less than 40 percent impaired, limited or restricted       Problem List Patient Active Problem List   Diagnosis Date Noted  . Chronic left shoulder pain 12/23/2016  . Chronic right shoulder pain 11/25/2016  . Fracture of superior rim of left pubis, subsequent encounter for fracture with delayed healing 11/25/2016  . Preventative health care 05/12/2016  . Osteoporosis 05/14/2015  . Unsteady gait 05/14/2015  . Memory impairment 11/13/2014  . Hyperglycemia 05/04/2014  . Medicare annual wellness visit, subsequent 05/04/2014  . Insomnia 05/04/2014  . Overactive bladder 05/04/2014  . Sun-damaged skin 04/04/2013  . Elevated BP 09/28/2012  . Arthralgia 05/03/2012  . Hyperlipidemia, mixed 09/30/2011  . COPD (chronic obstructive pulmonary disease) ([place]) 07/06/2008  . GENITAL HERPES 06/28/2008  . LEG CRAMPS 06/28/2008    Jeral Pinch  PT  01/01/2017, 5:08 PM  [facility] [place], [place], [address] Phone: [phone]   Fax:  [phone]  Name: Tanya Rush MRN: [id number] Date of Birth: 11/06/1945

## 2017-01-08 ENCOUNTER — Encounter: Payer: Medicare Other | Admitting: Physical Therapy

## 2017-01-10 ENCOUNTER — Encounter: Payer: Self-pay | Admitting: Physical Therapy

## 2017-01-10 ENCOUNTER — Ambulatory Visit (INDEPENDENT_AMBULATORY_CARE_PROVIDER_SITE_OTHER): Payer: Medicare Other | Admitting: Physical Therapy

## 2017-01-10 DIAGNOSIS — M25512 Pain in left shoulder: Secondary | ICD-10-CM | POA: Diagnosis not present

## 2017-01-10 DIAGNOSIS — M25531 Pain in right wrist: Secondary | ICD-10-CM | POA: Diagnosis not present

## 2017-01-10 DIAGNOSIS — G8929 Other chronic pain: Secondary | ICD-10-CM

## 2017-01-10 DIAGNOSIS — M25511 Pain in right shoulder: Secondary | ICD-10-CM

## 2017-01-10 DIAGNOSIS — M6281 Muscle weakness (generalized): Secondary | ICD-10-CM | POA: Diagnosis not present

## 2017-01-10 DIAGNOSIS — R293 Abnormal posture: Secondary | ICD-10-CM | POA: Diagnosis not present

## 2017-01-10 NOTE — Therapy (Signed)
Tanya Rush [place], [place], [address] Phone: [phone]   Fax:  [phone]  Physical Therapy Treatment  Patient Details  Name: Tanya Rush MRN: [id number] Date of Birth: 21-Nov-1945 Referring Provider: Dr Jean Rosenthal  Encounter Date: 01/10/2017      PT End of Session - 01/10/17 1331    Visit Number 2   Number of Visits 6   Date for PT Re-Evaluation 02/12/17   PT Start Time 7893   PT Stop Time 1411   PT Time Calculation (min) 38 min   Activity Tolerance Patient tolerated treatment well  pain decreased 50%       Past Medical History:  Diagnosis Date  . Asthma   . COPD (chronic obstructive pulmonary disease) ([place])    severe; O2 dependent-- followed by pulm at [facility]  . Elevated BP 09/28/2012  . Genital herpes   . History of ovarian cyst   . Medicare annual wellness visit, subsequent 05/04/2014  . Memory impairment 11/13/2014  . Osteoporosis 05/14/2015  . Overactive bladder 05/04/2014  . Scabies 01/16/2013  . Sun-damaged skin 04/04/2013  . Unsteady gait 05/14/2015    History reviewed. No pertinent surgical history.  There were no vitals filed for this visit.      Subjective Assessment - 01/10/17 1332    Subjective Pt reports she doing well with her HEP, she got intestinal upset a couple days ago and wasn't able to do ex and can tell a difference   Patient Stated Goals get rid of the pain, perform simple house hold tasks, make bed, cook, use computer, perform ADLs - hold hair dryer   Currently in Pain? Yes   Pain Score 6    Pain Location Shoulder   Pain Orientation Right   Pain Descriptors / Indicators Aching   Pain Type Acute pain   Pain Radiating Towards and into her hand, Lt shoulder 4/10   Pain Onset More than a month ago   Pain Frequency Constant   Aggravating Factors  over use of her shoulders and arms   Pain Relieving Factors exercise and medicine                          [facility] Adult PT Treatment/Exercise - 01/10/17 0001      Elbow Exercises   Elbow Flexion Other (comment)  grip Rt hand, soft ball flex/ext 2x10     Shoulder Exercises: Supine   Protraction --  wrist stretching flex/ext 3x 20 sec      Shoulder Exercises: Standing   External Rotation Strengthening;Both;20 reps;Theraband  leaning on pool noodle   Theraband Level (Shoulder External Rotation) Level 1 (Yellow)   Other Standing Exercises standing retraction shoulders around noodle  VC for form     Shoulder Exercises: Pulleys   Flexion 2 minutes     Manual Therapy   Manual Therapy Soft tissue mobilization   Manual therapy comments in seated   Soft tissue mobilization bilat upper traps, and pecs, STM, multiple trigger points in all areas.  She presented with more symmetrical posture after manual work and 50% reduction in shoulder and Rt thumb pain.                 PT Education - 01/10/17 1351    Education provided Yes   Education Details DN   Person(Tanya) Educated Patient   Methods Explanation;Handout   Comprehension Verbalized understanding  PT Long Term Goals - 01/10/17 1418      PT LONG TERM GOAL #1   Title I with advanced HEP for postural correction ( 02/12/17)    Status On-going     PT LONG TERM GOAL #2   Title report reduction of overall upper body pain =/> 75% with daily activity. ( 02/12/17)    Status On-going     PT LONG TERM GOAL #3   Title improve bilat shoulder ROM to [facility] to allow her to reach overhead without increased pain per her previous level ( 02/12/17)    Status On-going     PT LONG TERM GOAL #4   Title improve Rt wrist ROM and pain to allow her to hold on to objects per her previous level ( 02/12/17)    Status On-going     PT LONG TERM GOAL #5   Title improve FOTO =/< 38% limited, CJ level ( 02/12/17)    Status On-going               Plan - 01/10/17 1415    Clinical Impression  Statement This is [name]'Tanya second visit, she was doing well with her HEP until she got sick a couple days ago. Her pain increased with not performing her HEP.  She had a good response to treatement today.  Discussed DN, pt is fearful of needles so most likely will not want this.  No goals met yet.    Rehab Potential Good   PT Frequency 1x / week   PT Duration 6 weeks   PT Treatment/Interventions Moist Heat;Ultrasound;Therapeutic exercise;Dry needling;Taping;Manual techniques;Vasopneumatic Device;Neuromuscular re-education;Cryotherapy;Electrical Stimulation;Iontophoresis 72m/ml Dexamethasone;Patient/family education;Passive range of motion   PT Next Visit Plan postural re-ed, may benefit from DN and manual work.    Consulted and Agree with Plan of Care Patient      Patient will benefit from skilled therapeutic intervention in order to improve the following deficits and impairments:  Decreased range of motion, Impaired UE functional use, Increased muscle spasms, Pain, Decreased strength, Increased edema, Postural dysfunction  Visit Diagnosis: Chronic pain of both shoulders  Acute wrist pain, right  Abnormal posture  Muscle weakness (generalized)     Problem List Patient Active Problem List   Diagnosis Date Noted  . Chronic left shoulder pain 12/23/2016  . Chronic right shoulder pain 11/25/2016  . Fracture of superior rim of left pubis, subsequent encounter for fracture with delayed healing 11/25/2016  . Preventative health care 05/12/2016  . Osteoporosis 05/14/2015  . Unsteady gait 05/14/2015  . Memory impairment 11/13/2014  . Hyperglycemia 05/04/2014  . Medicare annual wellness visit, subsequent 05/04/2014  . Insomnia 05/04/2014  . Overactive bladder 05/04/2014  . Sun-damaged skin 04/04/2013  . Elevated BP 09/28/2012  . Arthralgia 05/03/2012  . Hyperlipidemia, mixed 09/30/2011  . [place] 07/06[place]  . GENITAL HERPES 06/28/2008  .  LEG CRAMPS 06/28/2008    SJeral PinchPT  01/10/2017, 2:18 PM  [facility] [address]Phone: [phone]  Fax:  [phone] Name: Tanya IKNERMRN: [id number] of Birth: [birth year]

## 2017-01-10 NOTE — Patient Instructions (Signed)

## 2017-01-13 ENCOUNTER — Encounter: Payer: Self-pay | Admitting: Family

## 2017-01-13 ENCOUNTER — Ambulatory Visit (INDEPENDENT_AMBULATORY_CARE_PROVIDER_SITE_OTHER): Payer: Medicare Other | Admitting: Family

## 2017-01-13 VITALS — BP 127/55 | HR 83 | Temp 98.2°F | Resp 18 | Ht 59.0 in | Wt 127.4 lb

## 2017-01-13 DIAGNOSIS — R197 Diarrhea, unspecified: Secondary | ICD-10-CM | POA: Diagnosis not present

## 2017-01-13 DIAGNOSIS — M2550 Pain in unspecified joint: Secondary | ICD-10-CM | POA: Diagnosis not present

## 2017-01-13 NOTE — Patient Instructions (Signed)
Please complete lab work prior to leaving. Return stool studies at your earliest convenience.  Go to the ER if you develop fever, abdominal pain, blood in stool.  You may use imodium as needed for diarrhea. Call if symptoms worsen or if not improved in 3 days.

## 2017-01-13 NOTE — Progress Notes (Signed)
Subjective:    Patient ID: Tanya Rush, female    DOB: February 09, 1946, 71 y.o.   MRN: [id number]  HPI  Tanya Rush a 71 yr old female who presents today with chief complaint of diarrhea. She reports that diarrhea has been present x 5 days.  Diarrhea was preceded by 3 days of constipation.  She took MOM x 2 for the constipation. She Rush having 10 loose stools a day.  Denies fever or abdominal pain. Denies recent antibiotic use or travel. No new medications.    She reports that her COPD Rush slightly worse than baseline because they are remodeling the bathroom near her bedroom.   Bilateral shoulder pain, right wrist pain.  Father had severe RA. She Rush seeing orthopedics for this.     Review of Systems    see HPI  Past Medical History:  Diagnosis Date  . Asthma   . COPD (chronic obstructive pulmonary disease) ([place])    severe; O2 dependent-- followed by pulm at [facility]  . Elevated BP 09/28/2012  . Genital herpes   . History of ovarian cyst   . Medicare annual wellness visit, subsequent 05/04/2014  . Memory impairment 11/13/2014  . Osteoporosis 05/14/2015  . Overactive bladder 05/04/2014  . Scabies 01/16/2013  . Sun-damaged skin 04/04/2013  . Unsteady gait 05/14/2015     Social History   Social History  . Marital status: Married    Spouse name: N/A  . Number of children: 3  . Years of education: N/A   Occupational History  . Not on file.   Social History Main Topics  . Smoking status: Former Smoker    Years: 45.00    Quit date: 08/05/2006  . Smokeless tobacco: Never Used  . Alcohol use No  . Drug use: No  . Sexual activity: No     Comment: lives with husband, no dietary restrictions   Other Topics Concern  . Not on file   Social History Narrative   3 children: 2 adopted, 1 biological          No past surgical history on file.  Family History  Problem Relation Age of Onset  . Heart disease Mother   . Stroke Mother   . Arthritis Father        rheumatoid  .  Lymphoma Father   . COPD Father        smoker  . Alcohol abuse Brother        TBI  . Birth defects Brother   . Cancer Neg Hx   . Alcohol abuse Son   . Alcohol abuse Son        recovering x 3 years  . Reye's syndrome Brother   . Birth defects Brother   . Hip fracture Maternal Aunt     Allergies  Allergen Reactions  . Chocolate     Other reaction(s): Unknown  . Aspirin     REACTION: sever chest pain    Current Outpatient Prescriptions on File Prior to Visit  Medication Sig Dispense Refill  . albuterol (PROAIR HFA) 108 (90 BASE) MCG/ACT inhaler Inhale 2 puffs into the lungs daily as needed.      Marland Kitchen atorvastatin (LIPITOR) 20 MG tablet Take 1 tablet (20 mg total) by mouth daily at 6 PM. 90 tablet 1  . Fluticasone-Salmeterol (ADVAIR DISKUS) 250-50 MCG/DOSE AEPB Inhale 1 puff into the lungs every 12 (twelve) hours.     . hydrochlorothiazide (HYDRODIURIL) 25 MG tablet TAKE 1 TABLET (25  MG TOTAL) BY MOUTH DAILY. 90 tablet 1  . HYDROcodone-acetaminophen (NORCO/VICODIN) 5-325 MG tablet Take 1 tablet by mouth every 6 (six) hours as needed. 30 tablet 0  . lisinopril (PRINIVIL,ZESTRIL) 10 MG tablet Take 1 tablet (10 mg total) by mouth daily. 90 tablet 1  . Multiple Vitamin (MULTIVITAMIN) tablet Take 1 tablet by mouth daily.    Marland Kitchen oxybutynin (DITROPAN-XL) 10 MG 24 hr tablet Take 1 tablet (10 mg total) by mouth at bedtime. 90 tablet 3  . valACYclovir (VALTREX) 1000 MG tablet TAKE 1 TABLET EVERY DAY 90 tablet 1   No current facility-administered medications on file prior to visit.     BP (!) 127/55 (BP Location: Right Arm, Cuff Size: Normal)   Pulse 83   Temp 98.2 F (36.8 C) (Oral)   Resp 18   Ht 4\' 11"  (1.499 m)   Wt 127 lb 6.4 oz (57.8 kg)   SpO2 98%   BMI 25.73 kg/m    Objective:   Physical Exam  Constitutional: She Rush oriented to person, place, and time. She appears well-developed and well-nourished.  Cardiovascular: Normal rate, regular rhythm and normal heart sounds.   No  murmur heard. Pulmonary/Chest: Effort normal and breath sounds normal. She has no wheezes.  Mild increased WOB  Abdominal: Soft. Bowel sounds are normal. She exhibits no distension. There Rush no tenderness. There Rush no rebound and no guarding.  Musculoskeletal: She exhibits no edema.  Neurological: She Rush alert and oriented to person, place, and time.  Psychiatric: She has a normal mood and affect. Her behavior Rush normal. Judgment and thought content normal.          Assessment & Plan:  Arthralgia- check autoimmune panel due to family history.   Diarrhea- ? Viral gastroenteritis versus infectious cause. Check CBC, CMET, stool studies.  Advised pt as follows:  Please complete lab work prior to leaving. Return stool studies at your earliest convenience.  Go to the ER if you develop fever, abdominal pain, blood in stool.  You may use imodium as needed for diarrhea. Call if symptoms worsen or if not improved in 3 days.

## 2017-01-14 DIAGNOSIS — R197 Diarrhea, unspecified: Secondary | ICD-10-CM | POA: Diagnosis not present

## 2017-01-14 LAB — CBC WITH DIFFERENTIAL/PLATELET
Basophils Absolute: 0.1 10*3/uL (ref 0.0–0.1)
Basophils Relative: 0.7 % (ref 0.0–3.0)
EOS PCT: 3.8 % (ref 0.0–5.0)
Eosinophils Absolute: 0.4 10*3/uL (ref 0.0–0.7)
HCT: 32 % — ABNORMAL LOW (ref 36.0–46.0)
Hemoglobin: 10.2 g/dL — ABNORMAL LOW (ref 12.0–15.0)
LYMPHS ABS: 1.5 10*3/uL (ref 0.7–4.0)
Lymphocytes Relative: 12.6 % (ref 12.0–46.0)
MCHC: 32 g/dL (ref 30.0–36.0)
MCV: 101.4 fl — AB (ref 78.0–100.0)
MONOS PCT: 12.1 % — AB (ref 3.0–12.0)
Monocytes Absolute: 1.4 10*3/uL — ABNORMAL HIGH (ref 0.1–1.0)
NEUTROS ABS: 8.4 10*3/uL — AB (ref 1.4–7.7)
NEUTROS PCT: 70.8 % (ref 43.0–77.0)
PLATELETS: 320 10*3/uL (ref 150.0–400.0)
RBC: 3.15 Mil/uL — ABNORMAL LOW (ref 3.87–5.11)
RDW: 13.3 % (ref 11.5–15.5)
WBC: 11.8 10*3/uL — ABNORMAL HIGH (ref 4.0–10.5)

## 2017-01-14 LAB — COMPREHENSIVE METABOLIC PANEL
ALK PHOS: 56 U/L (ref 39–117)
ALT: 11 U/L (ref 0–35)
AST: 19 U/L (ref 0–37)
Albumin: 3.8 g/dL (ref 3.5–5.2)
BUN: 17 mg/dL (ref 6–23)
CHLORIDE: 93 meq/L — AB (ref 96–112)
CO2: 40 meq/L — AB (ref 19–32)
Calcium: 9.7 mg/dL (ref 8.4–10.5)
Creatinine, Ser: 0.57 mg/dL (ref 0.40–1.20)
GFR: 111.52 mL/min (ref >60.00)
GLUCOSE: 77 mg/dL (ref 70–99)
POTASSIUM: 4.2 meq/L (ref 3.5–5.1)
SODIUM: 136 meq/L (ref 135–145)
Total Bilirubin: 0.2 mg/dL (ref 0.2–1.2)
Total Protein: 7 g/dL (ref 6.0–8.3)

## 2017-01-14 LAB — RHEUMATOID FACTOR: RHEUMATOID FACTOR: 28 [IU]/mL — AB (ref <14)

## 2017-01-14 LAB — ANTI-NUCLEAR AB-TITER (ANA TITER): ANA Titer 1: 1:40 {titer} — ABNORMAL HIGH

## 2017-01-14 LAB — ANA: Anti Nuclear Antibody(ANA): POSITIVE — AB

## 2017-01-14 LAB — SEDIMENTATION RATE: SED RATE: 31 mm/h — AB (ref 0–30)

## 2017-01-15 ENCOUNTER — Ambulatory Visit (INDEPENDENT_AMBULATORY_CARE_PROVIDER_SITE_OTHER): Payer: Medicare Other | Admitting: Physical Therapy

## 2017-01-15 ENCOUNTER — Encounter: Payer: Self-pay | Admitting: Physical Therapy

## 2017-01-15 DIAGNOSIS — G8929 Other chronic pain: Secondary | ICD-10-CM

## 2017-01-15 DIAGNOSIS — M6281 Muscle weakness (generalized): Secondary | ICD-10-CM

## 2017-01-15 DIAGNOSIS — M25511 Pain in right shoulder: Secondary | ICD-10-CM

## 2017-01-15 DIAGNOSIS — R293 Abnormal posture: Secondary | ICD-10-CM

## 2017-01-15 DIAGNOSIS — M25512 Pain in left shoulder: Secondary | ICD-10-CM

## 2017-01-15 DIAGNOSIS — M25531 Pain in right wrist: Secondary | ICD-10-CM

## 2017-01-15 LAB — OVA AND PARASITE EXAMINATION: OP: NONE SEEN

## 2017-01-15 LAB — CLOSTRIDIUM DIFFICILE BY PCR: CDIFFPCR: NOT DETECTED

## 2017-01-15 NOTE — Therapy (Signed)
[place], [place], [address] Phone: [phone]   Fax:  [phone]  Physical Therapy Treatment  Patient Details  Name: Tanya Rush MRN: [id number] Date of Birth: Jun 16, 1946 Referring Provider: Dr Jean Rosenthal  Encounter Date: 01/15/2017      PT End of Session - 01/15/17 1521    Visit Number 2 ( from last week)  todays appointment canceled.    Date for PT Re-Evaluation 02/12/17   PT Stop Time 1521   Activity Tolerance Patient limited by pain  tx held today due to pain      Past Medical History:  Diagnosis Date  . Asthma   . COPD (chronic obstructive pulmonary disease) ([place])    severe; O2 dependent-- followed by pulm at [facility]  . Elevated BP 09/28/2012  . Genital herpes   . History of ovarian cyst   . Medicare annual wellness visit, subsequent 05/04/2014  . Memory impairment 11/13/2014  . Osteoporosis 05/14/2015  . Overactive bladder 05/04/2014  . Scabies 01/16/2013  . Sun-damaged skin 04/04/2013  . Unsteady gait 05/14/2015    History reviewed. No pertinent surgical history.  There were no vitals filed for this visit.      Subjective Assessment - 01/15/17 1513    Subjective Pt in extreme pain in her Rt wrist and bilat shoulders are worse too.  She reports she doesn't feel up to therapy and is scared about making things worse.  She saw her primary MD on Monday and they ordered blood work to look for causes for her pain.  She and husband requested to hold todays treatment due to the level of pain.  Recommend they call MD office to see if they have her lab work back yet.    Currently in Pain? Yes   Pain Score --  15/10 pain per husband, Tanya Rush reports she hurts so much she doesn't want to use her Rt hand or shoulders.             PT Long Term Goals - 01/10/17 1418      PT LONG TERM GOAL #1   Title I with advanced HEP for postural correction ( 02/12/17)    Status On-going      PT LONG TERM GOAL #2   Title report reduction of overall upper body pain =/> 75% with daily activity. ( 02/12/17)    Status On-going     PT LONG TERM GOAL #3   Title improve bilat shoulder ROM to [facility] to allow her to reach overhead without increased pain per her previous level ( 02/12/17)    Status On-going     PT LONG TERM GOAL #4   Title improve Rt wrist ROM and pain to allow her to hold on to objects per her previous level ( 02/12/17)    Status On-going     PT LONG TERM GOAL #5   Title improve FOTO =/< 38% limited, CJ level ( 02/12/17)    Status On-going             Patient will benefit from skilled therapeutic intervention in order to improve the following deficits and impairments:     Visit Diagnosis: Chronic pain of both shoulders  Acute wrist pain, right  Abnormal posture  Muscle weakness (generalized)     Problem List Patient Active Problem List   Diagnosis Date Noted  . Chronic left shoulder pain 12/23/2016  . Chronic right shoulder pain 11/25/2016  . Fracture of superior  rim of left pubis, subsequent encounter for fracture with delayed healing 11/25/2016  . Preventative health care 05/12/2016  . Osteoporosis 05/14/2015  . Unsteady gait 05/14/2015  . Memory impairment 11/13/2014  . Hyperglycemia 05/04/2014  . Medicare annual wellness visit, subsequent 05/04/2014  . Insomnia 05/04/2014  . Overactive bladder 05/04/2014  . Sun-damaged skin 04/04/2013  . Elevated BP 09/28/2012  . Arthralgia 05/03/2012  . Hyperlipidemia, mixed 09/30/2011  . COPD (chronic obstructive pulmonary disease) ([place]) 07/06/2008  . GENITAL HERPES 06/28/2008  . LEG CRAMPS 06/28/2008    Jeral Pinch PT  01/15/2017, 3:22 PM  [place], [place], [address] Phone: [phone]   Fax:  [phone]  Name: Tanya Rush MRN: [id number] Date of Birth: 09/29/1945

## 2017-01-16 ENCOUNTER — Telehealth (INDEPENDENT_AMBULATORY_CARE_PROVIDER_SITE_OTHER): Payer: Self-pay | Admitting: Orthopaedic Surgery

## 2017-01-16 NOTE — Telephone Encounter (Signed)
Made appt for patient tomorrow

## 2017-01-16 NOTE — Telephone Encounter (Signed)
Patient's husband Gershon Mussel) called advised Meliyah is in a great deal of pain. He advised her right wrist is hurting pretty bad. Tom asked if she can get scheduled for a MRI on her right wrist and shoulders. He said the pain medicine makes her sleep all the time. The number to contact Gershon Mussel is [phone]

## 2017-01-16 NOTE — Telephone Encounter (Signed)
I only see where she's been treated for pubic rami fracture and shoulder pain on the right. Do not see any evaluation of her wrist. Therefore do not feel we can order an MRI of her wrist we can order an MRI of her right shoulder rule out rotator cuff tear thanks

## 2017-01-16 NOTE — Telephone Encounter (Signed)
PLEASE ADVISE.

## 2017-01-17 ENCOUNTER — Ambulatory Visit (INDEPENDENT_AMBULATORY_CARE_PROVIDER_SITE_OTHER): Payer: Medicare Other | Admitting: Physician Assistant

## 2017-01-17 ENCOUNTER — Ambulatory Visit (INDEPENDENT_AMBULATORY_CARE_PROVIDER_SITE_OTHER): Payer: Medicare Other

## 2017-01-17 DIAGNOSIS — M25511 Pain in right shoulder: Secondary | ICD-10-CM

## 2017-01-17 DIAGNOSIS — M7711 Lateral epicondylitis, right elbow: Secondary | ICD-10-CM

## 2017-01-17 DIAGNOSIS — M25531 Pain in right wrist: Secondary | ICD-10-CM

## 2017-01-17 DIAGNOSIS — M25512 Pain in left shoulder: Secondary | ICD-10-CM | POA: Diagnosis not present

## 2017-01-17 DIAGNOSIS — G8929 Other chronic pain: Secondary | ICD-10-CM

## 2017-01-17 MED ORDER — LIDOCAINE HCL 1 % IJ SOLN
3.0000 mL | INTRAMUSCULAR | Status: AC | PRN
  Administered 2017-01-17: 3 mL

## 2017-01-17 MED ORDER — METHYLPREDNISOLONE ACETATE 40 MG/ML IJ SUSP
40.0000 mg | INTRAMUSCULAR | Status: AC | PRN
  Administered 2017-01-17: 40 mg

## 2017-01-17 NOTE — Progress Notes (Signed)
Office Visit Note   Patient: Tanya Rush           Date of Birth: 1945/11/17           MRN: [id number] Visit Date: 01/17/2017              Requested by: Mosie Lukes, MD [address] [place], [place] [address] PCP: Mosie Lukes, MD   Assessment & Plan: Visit Diagnoses:  1. Pain in right wrist   2. Chronic right shoulder pain   3. Chronic left shoulder pain   4. Lateral epicondylitis, right elbow     Plan: See her back in approximately 2 weeks to check her progress lack of. Did show her some stretching exercises for the lateral epicondylitis. She'll continue physical therapy for her shoulders. Recommended ice over the left elbow region.  Follow-Up Instructions: Return in about 2 weeks (around 01/31/2017).   Orders:  Orders Placed This Encounter  Procedures  . Hand/Upper Extremity Injection/Arthrocentesis  . XR Wrist Complete Right  . XR Shoulder Left  . XR Shoulder Right   No orders of the defined types were placed in this encounter.     Procedures: Hand/UE Inj Date/Time: 01/17/2017 4:09 PM Performed by: Pete Pelt Authorized by: Pete Pelt   Consent Given by:  Patient Indications:  Pain and therapeutic Condition: lateral epicondylitis   Site:  R elbow Needle Size:  25 G Medications:  3 mL lidocaine 1 %; 40 mg methylPREDNISolone acetate 40 MG/ML     Clinical Data: No additional findings.   Subjective: No chief complaint on file.   HPI Tanya Rush returns today due to bilateral shoulder pain her main complaint though is her right wrist pain. She states she's had severe pain in right wrist travels up to the elbow. She is going to physical therapy for her shoulders but feels that this has not been beneficial so far and she's had severe pain in the wrist that it has impeded her going to therapy. She has COPD and is on chronic oxygen. No known injury at the wrist.  .Review of Systems  Please see history of present illness    Objective: Vital Signs: There were no vitals taken for this visit.  Physical Exam  Ortho Exam bilateral shoulder she has negative impingement sign. 5 out of 5 strength with external and internal rotation against resistance. Empty can test is negative bilaterally. Right wrist she has some tenderness over the mid carpal bone region radial side. She has tenderness over the lateral epicondyle region of the right elbow. No rashes skin lesions ulcerations otherwise good range of motion elbow. Extension of the wrist against resistance causes pain down the arm and into the wrist. She has pain also with resisted supination of the right hand.  Specialty Comments:  No specialty comments available.  Imaging: No results found.   PMFS History: Patient Active Problem List   Diagnosis Date Noted  . Chronic left shoulder pain 12/23/2016  . Chronic right shoulder pain 11/25/2016  . Fracture of superior rim of left pubis, subsequent encounter for fracture with delayed healing 11/25/2016  . Preventative health care 05/12/2016  . Osteoporosis 05/14/2015  . Unsteady gait 05/14/2015  . Memory impairment 11/13/2014  . Hyperglycemia 05/04/2014  . Medicare annual wellness visit, subsequent 05/04/2014  . Insomnia 05/04/2014  . Overactive bladder 05/04/2014  . Sun-damaged skin 04/04/2013  . Elevated BP 09/28/2012  . Arthralgia 05/03/2012  . Hyperlipidemia, mixed 09/30/2011  .  COPD (chronic obstructive pulmonary disease) ([place]) 07/06/2008  . GENITAL HERPES 06/28/2008  . LEG CRAMPS 06/28/2008   Past Medical History:  Diagnosis Date  . Asthma   . COPD (chronic obstructive pulmonary disease) ([place])    severe; O2 dependent-- followed by pulm at [facility]  . Elevated BP 09/28/2012  . Genital herpes   . History of ovarian cyst   . Medicare annual wellness visit, subsequent 05/04/2014  . Memory impairment 11/13/2014  . Osteoporosis 05/14/2015  . Overactive bladder 05/04/2014  . Scabies 01/16/2013  .  Sun-damaged skin 04/04/2013  . Unsteady gait 05/14/2015    Family History  Problem Relation Age of Onset  . Heart disease Mother   . Stroke Mother   . Arthritis Father        rheumatoid  . Lymphoma Father   . COPD Father        smoker  . Alcohol abuse Brother        TBI  . Birth defects Brother   . Cancer Neg Hx   . Alcohol abuse Son   . Alcohol abuse Son        recovering x 3 years  . Reye's syndrome Brother   . Birth defects Brother   . Hip fracture Maternal Aunt     No past surgical history on file. Social History   Occupational History  . Not on file.   Social History Main Topics  . Smoking status: Former Smoker    Years: 45.00    Quit date: 08/05/2006  . Smokeless tobacco: Never Used  . Alcohol use No  . Drug use: No  . Sexual activity: No     Comment: lives with husband, no dietary restrictions

## 2017-01-19 DIAGNOSIS — J449 Chronic obstructive pulmonary disease, unspecified: Secondary | ICD-10-CM | POA: Diagnosis not present

## 2017-01-20 ENCOUNTER — Telehealth: Payer: Self-pay | Admitting: Family

## 2017-01-20 DIAGNOSIS — D649 Anemia, unspecified: Secondary | ICD-10-CM

## 2017-01-20 DIAGNOSIS — M2550 Pain in unspecified joint: Secondary | ICD-10-CM

## 2017-01-20 LAB — STOOL CULTURE

## 2017-01-20 NOTE — Telephone Encounter (Signed)
Please let pt know that her autoimmune screening tests are abnormal. Given her joint pain and family history, I would like to refer her to rheumatology for further evaluation.    Please also let her know that she is anemic. Red blood cells are large so I would like to add on b12/folate to see if this is low. Also, I would like her to complete an IFOB test to make sure she is not losing any blood through her stool.    Stool studies are negative. How is her diarrhea?

## 2017-01-20 NOTE — Telephone Encounter (Signed)
Notified pt and she voices understanding. Pt is agreeable to proceed with referral. Unable to add additional lab tests below due to age of specimen so pt scheduled lab appt for 01/23/17 at 3:30pm. Future lab order entered and IFOB kit left in lab for pt to pick up on 01/23/17. Pt reports that diarrhea has stopped.

## 2017-01-22 ENCOUNTER — Ambulatory Visit (INDEPENDENT_AMBULATORY_CARE_PROVIDER_SITE_OTHER): Payer: Medicare Other | Admitting: Physical Therapy

## 2017-01-22 ENCOUNTER — Encounter: Payer: Self-pay | Admitting: Physical Therapy

## 2017-01-22 DIAGNOSIS — M25511 Pain in right shoulder: Secondary | ICD-10-CM

## 2017-01-22 DIAGNOSIS — G8929 Other chronic pain: Secondary | ICD-10-CM

## 2017-01-22 DIAGNOSIS — M25512 Pain in left shoulder: Secondary | ICD-10-CM

## 2017-01-22 DIAGNOSIS — R293 Abnormal posture: Secondary | ICD-10-CM

## 2017-01-22 DIAGNOSIS — M6281 Muscle weakness (generalized): Secondary | ICD-10-CM | POA: Diagnosis not present

## 2017-01-22 DIAGNOSIS — M25531 Pain in right wrist: Secondary | ICD-10-CM | POA: Diagnosis not present

## 2017-01-22 NOTE — Therapy (Signed)
[place], [place], [address] Phone: [phone]   Fax:  [phone]  Physical Therapy Treatment  Patient Details  Name: Tanya Rush MRN: [id number] Date of Birth: 03-Apr-1946 Referring Provider: Dr Jean Rosenthal  Encounter Date: 01/22/2017      PT End of Session - 01/22/17 1507    Visit Number 3   Number of Visits 6   Date for PT Re-Evaluation 02/12/17   PT Start Time 1509   PT Stop Time 1601   PT Time Calculation (min) 35 min   Activity Tolerance Other (comment)  pt self limiting with treatment today.       Past Medical History:  Diagnosis Date  . Asthma   . COPD (chronic obstructive pulmonary disease) ([place])    severe; O2 dependent-- followed by pulm at [facility]  . Elevated BP 09/28/2012  . Genital herpes   . History of ovarian cyst   . Medicare annual wellness visit, subsequent 05/04/2014  . Memory impairment 11/13/2014  . Osteoporosis 05/14/2015  . Overactive bladder 05/04/2014  . Scabies 01/16/2013  . Sun-damaged skin 04/04/2013  . Unsteady gait 05/14/2015    History reviewed. No pertinent surgical history.  There were no vitals filed for this visit.      Subjective Assessment - 01/22/17 1504    Subjective Pt reports her blood work came back and she is being referred to a rheumatologist for a consult.  She also had her Rt wrist evaluated by her ortho MD and told she had tennis elbow, had an injection and hasn't had any wrist pain since.  Her  shoulders have felt good for a couple days now also.  She reports she doesn't want to do anything with her Rt hand because she doesn't want to mess anything up    Diagnostic tests recent x-rays of bilat shoulders show no fx and good bony alignment, Rt wrist some arthritic changes in the carpals.    Patient Stated Goals get rid of the pain, perform simple house hold tasks, make bed, cook, use computer, perform ADLs - hold hair dryer   Currently in Pain? No/denies                         [facility] Adult PT Treatment/Exercise - 01/22/17 0001      Shoulder Exercises: Standing   Other Standing Exercises standing scapular retraction around noodle 10 x 5 sec. then active assist scap retraction with red band, patient good with shoulder ext however has difficulty bring scapula into the movement.      Modalities   Modalities Ultrasound     Ultrasound   Ultrasound Location Rt upper trap   Ultrasound Parameters 100%, 1.5w/cm2, 1.11mHz   Ultrasound Goals Pain  tone     Manual Therapy   Manual Therapy Soft tissue mobilization;Joint mobilization   Manual therapy comments in seated   Joint Mobilization gentle scapular mobs, Lt side very hypomobile, Rt a little hypomobile   Soft tissue mobilization Rt upper trap STM and TPR                     PT Long Term Goals - 01/22/17 1504      PT LONG TERM GOAL #1   Title I with advanced HEP for postural correction ( 02/12/17)    Status On-going     PT LONG TERM GOAL #2   Title report reduction of overall upper body pain =/>  75% with daily activity. ( 02/12/17)    Status On-going     PT LONG TERM GOAL #3   Title improve bilat shoulder ROM to [facility] to allow her to reach overhead without increased pain per her previous level ( 02/12/17)    Status On-going     PT LONG TERM GOAL #4   Title improve Rt wrist ROM and pain to allow her to hold on to objects per her previous level ( 02/12/17)    Status On-going     PT LONG TERM GOAL #5   Title improve FOTO =/< 38% limited, CJ level ( 02/12/17)    Status On-going               Plan - 01/22/17 1546    Clinical Impression Statement Tanya Rush was very nervous about therapy today, she has finally been feeling well and was hesitant to perform exercise.  Refused to do anything that involved the Rt UE and self limited number of reps of exercise.  She required multiple VC to lower the Rt shoulder complex and has many  trigger points in the upper trap and levator.  She also has a lot of difficulty isolating scapular movement.    Rehab Potential Good   PT Frequency 1x / week   PT Duration 6 weeks   PT Treatment/Interventions Moist Heat;Ultrasound;Therapeutic exercise;Dry needling;Taping;Manual techniques;Vasopneumatic Device;Neuromuscular re-education;Cryotherapy;Electrical Stimulation;Iontophoresis 4mg /ml Dexamethasone;Patient/family education;Passive range of motion   PT Next Visit Plan if still tight discuss trial of DN   Consulted and Agree with Plan of Care Patient;Family member/caregiver   Family Member Consulted spouse      Patient will benefit from skilled therapeutic intervention in order to improve the following deficits and impairments:  Decreased range of motion, Impaired UE functional use, Increased muscle spasms, Pain, Decreased strength, Increased edema, Postural dysfunction  Visit Diagnosis: Chronic pain of both shoulders  Acute wrist pain, right  Abnormal posture  Muscle weakness (generalized)     Problem List Patient Active Problem List   Diagnosis Date Noted  . Chronic left shoulder pain 12/23/2016  . Chronic right shoulder pain 11/25/2016  . Fracture of superior rim of left pubis, subsequent encounter for fracture with delayed healing 11/25/2016  . Preventative health care 05/12/2016  . Osteoporosis 05/14/2015  . Unsteady gait 05/14/2015  . Memory impairment 11/13/2014  . Hyperglycemia 05/04/2014  . Medicare annual wellness visit, subsequent 05/04/2014  . Insomnia 05/04/2014  . Overactive bladder 05/04/2014  . Sun-damaged skin 04/04/2013  . Elevated BP 09/28/2012  . Arthralgia 05/03/2012  . Hyperlipidemia, mixed 09/30/2011  . COPD (chronic obstructive pulmonary disease) ([place]) 07/06/2008  . GENITAL HERPES 06/28/2008  . LEG CRAMPS 06/28/2008    Jeral Pinch PT  01/22/2017, 3:49 PM  [facility] [place], [place], [address] Phone: [phone]   Fax:  [phone]  Name: Tanya Rush MRN: [id number] Date of Birth: 02/06/1946

## 2017-01-23 ENCOUNTER — Other Ambulatory Visit: Payer: Self-pay | Admitting: Family Medicine

## 2017-01-23 ENCOUNTER — Other Ambulatory Visit (INDEPENDENT_AMBULATORY_CARE_PROVIDER_SITE_OTHER): Payer: Medicare Other

## 2017-01-23 DIAGNOSIS — D649 Anemia, unspecified: Secondary | ICD-10-CM

## 2017-01-24 LAB — FOLATE: FOLATE: 21.2 ng/mL (ref >5.9)

## 2017-01-24 LAB — VITAMIN B12: Vitamin B-12: 1315 pg/mL — ABNORMAL HIGH (ref 211–911)

## 2017-01-29 ENCOUNTER — Ambulatory Visit (INDEPENDENT_AMBULATORY_CARE_PROVIDER_SITE_OTHER): Payer: Medicare Other | Admitting: Physical Therapy

## 2017-01-29 DIAGNOSIS — G8929 Other chronic pain: Secondary | ICD-10-CM

## 2017-01-29 DIAGNOSIS — M25511 Pain in right shoulder: Secondary | ICD-10-CM | POA: Diagnosis not present

## 2017-01-29 DIAGNOSIS — R293 Abnormal posture: Secondary | ICD-10-CM | POA: Diagnosis not present

## 2017-01-29 DIAGNOSIS — M25512 Pain in left shoulder: Secondary | ICD-10-CM

## 2017-01-29 DIAGNOSIS — M6281 Muscle weakness (generalized): Secondary | ICD-10-CM

## 2017-01-29 DIAGNOSIS — M25531 Pain in right wrist: Secondary | ICD-10-CM

## 2017-01-29 NOTE — Patient Instructions (Addendum)
Posture - Sitting    Sit upright, head facing forward. Try using a roll to support lower back. Keep shoulders relaxed, and avoid rounded back. Keep hips level with knees. Avoid crossing legs for long periods.   Flexibility: Neck Retraction    Pull head straight back, keeping eyes and jaw level. Repeat 5-10____ times per set. Do ____ sets per session. Do _every 30-45 min or after being on the computer___ sessions per day.  Scapular Retraction (Standing)    With arms at sides, pinch shoulder blades together. Repeat _5-10___ times per set. Do ____ sets per session. Do __every 30-45 min or after being on the computer. __ sessions per day.  ELBOW: Biceps - Standing step forward don't lean    Standing in doorway, place one hand on wall, elbow straight. Step forward. Hold _30-45__ seconds. _1__ reps per set, _1__ sets per day, __7_ days per week. Repeat on the other side  Copyright  VHI. All rights reserved.

## 2017-01-29 NOTE — Therapy (Signed)
[place], [place], [address] Phone: [phone]   Fax:  [phone]  Physical Therapy Treatment  Patient Details  Name: Tanya Rush MRN: [id number] Date of Birth: [birth year] Referring Provider: Dr Jean Rosenthal  Encounter Date: 01/29/2017      PT End of Session - 01/29/17 1434    Visit Number 4   Number of Visits 6   Date for PT Re-Evaluation 02/12/17   PT Start Time 7482   PT Stop Time 1502   PT Time Calculation (min) 28 min   Activity Tolerance Patient tolerated treatment well      Past Medical History:  Diagnosis Date  . Asthma   . COPD (chronic obstructive pulmonary disease) ([place])    severe; O2 dependent-- followed by pulm at [facility]  . Elevated BP 09/28/2012  . Genital herpes   . History of ovarian cyst   . Medicare annual wellness visit, subsequent 05/04/2014  . Memory impairment 11/13/2014  . Osteoporosis 05/14/2015  . Overactive bladder 05/04/2014  . Scabies 01/16/2013  . Sun-damaged skin 04/04/2013  . Unsteady gait 05/14/2015    No past surgical history on file.  There were no vitals filed for this visit.          [facility] PT Assessment - 01/29/17 0001      Assessment   Medical Diagnosis chronic bilat shoulder pain and Rt wrist pain   Referring Provider Dr Jean Rosenthal   Onset Date/Surgical Date 11/20/16   Hand Dominance Right   Next MD Visit PRN     Observation/Other Assessments   Focus on Therapeutic Outcomes (FOTO)  39% limited     AROM   Left Shoulder Extension 78 Degrees   Left Shoulder Flexion 165 Degrees   Left Shoulder ABduction 175 Degrees   Left Shoulder Internal Rotation 65 Degrees   Left Shoulder External Rotation 78 Degrees   Right Wrist Extension 75 Degrees   Right Wrist Flexion 80 Degrees   Cervical Extension 46 no pain   Cervical - Right Rotation 50 no pain   Cervical - Left Rotation 67 no pain                   Pt  performed scapular and cervical retraction, progression of HEP into red band.  Doorway stretch with arm straight.  Discussed sitting postioning and exercise to do while on the computer.                PT Long Term Goals - 01/29/17 1441      PT LONG TERM GOAL #1   Title I with advanced HEP for postural correction ( 02/12/17)    Status Achieved     PT LONG TERM GOAL #2   Status Achieved     PT LONG TERM GOAL #3   Title improve bilat shoulder ROM to [facility] to allow her to reach overhead without increased pain per her previous level ( 02/12/17)    Status Achieved     PT LONG TERM GOAL #4   Title improve Rt wrist ROM and pain to allow her to hold on to objects per her previous level ( 02/12/17)    Status Achieved     PT LONG TERM GOAL #5   Title improve FOTO =/< 38% limited, CJ level ( 02/12/17)    Status Partially Met  39% limited, CJ level                Plan -  2017-02-20 1508    Clinical Impression Statement Sherri and her husband are very pleased with her progress and reports they feel that she is ready for discharge.  She has her HEP, was issued red band to progress and given some positioning recommendations for when she is on the computer along with ex to reverse the forward positioining.    PT Next Visit Plan discharge to HEP   Consulted and Agree with Plan of Care Patient      Patient will benefit from skilled therapeutic intervention in order to improve the following deficits and impairments:     Visit Diagnosis: Muscle weakness (generalized)  Abnormal posture  Acute wrist pain, right  Chronic pain of both shoulders       [facility] PT PB G-CODES - 2017/02/20 1510    Functional Assessment Tool Used  FOTO and professional judgement    Functional Limitations Other PT primary   Other PT Primary Goal Status (J0300) At least 20 percent but less than 40 percent impaired, limited or restricted   Other PT Primary Discharge Status (P2330) At least 20 percent but less  than 40 percent impaired, limited or restricted      Problem List Patient Active Problem List   Diagnosis Date Noted  . Chronic left shoulder pain 12/23/2016  . Chronic right shoulder pain 11/25/2016  . Fracture of superior rim of left pubis, subsequent encounter for fracture with delayed healing 11/25/2016  . Preventative health care 05/12/2016  . Osteoporosis 05/14/2015  . Unsteady gait 05/14/2015  . Memory impairment 11/13/2014  . Hyperglycemia 05/04/2014  . Medicare annual wellness visit, subsequent 05/04/2014  . Insomnia 05/04/2014  . Overactive bladder 05/04/2014  . Sun-damaged skin 04/04/2013  . Elevated BP 09/28/2012  . Arthralgia 05/03/2012  . Hyperlipidemia, mixed 09/30/2011  . COPD (chronic obstructive pulmonary disease) ([place]) 07/06/2008  . GENITAL HERPES 06/28/2008  . LEG CRAMPS 06/28/2008    Jeral Pinch PT  02/20/2017, 3:10 PM  [facility] [place], [place], [address] Phone: [phone]   Fax:  [phone]  Name: Tanya Rush MRN: [id number] Date of Birth: 1945-08-31  PHYSICAL THERAPY DISCHARGE SUMMARY  Visits from Start of Care: 4  Current functional level related to goals / functional outcomes: See above   Remaining deficits: None except what is limited by breathing   Education / Equipment: HEP Plan: Patient agrees to discharge.  Patient goals were partially met. Patient is being discharged due to being pleased with the current functional level.  ?????    Jeral Pinch, PT 02/20/2017 3:14 PM

## 2017-01-30 ENCOUNTER — Ambulatory Visit (INDEPENDENT_AMBULATORY_CARE_PROVIDER_SITE_OTHER): Payer: Medicare Other | Admitting: Orthopaedic Surgery

## 2017-02-03 ENCOUNTER — Ambulatory Visit (INDEPENDENT_AMBULATORY_CARE_PROVIDER_SITE_OTHER): Payer: Medicare Other | Admitting: Orthopaedic Surgery

## 2017-02-03 DIAGNOSIS — M25512 Pain in left shoulder: Secondary | ICD-10-CM | POA: Diagnosis not present

## 2017-02-03 DIAGNOSIS — G8929 Other chronic pain: Secondary | ICD-10-CM

## 2017-02-03 NOTE — Progress Notes (Signed)
Patient is someone who is on chronic oxygen due to lung disease. I've seen her before for chronic left shoulder pain. She most recently saw Benita Stabile PA-C who placed a steroid injection in her right lateral epicondylar area to treat tennis elbow. She said that injection is done great and her pain is minimal. She's been working on stretching exercises well.  On exam her pain is minimal over the right elbow epicondylar area. She was able demonstrate the stretching exercises well. Her left shoulder pain stable.  At this point she'll follow-up as needed. The only other recommendations I would have for her repeat injections if needed at any point in therapy.

## 2017-02-06 ENCOUNTER — Encounter: Payer: Medicare Other | Admitting: Physical Therapy

## 2017-02-18 DIAGNOSIS — J449 Chronic obstructive pulmonary disease, unspecified: Secondary | ICD-10-CM | POA: Diagnosis not present

## 2017-03-21 DIAGNOSIS — J449 Chronic obstructive pulmonary disease, unspecified: Secondary | ICD-10-CM | POA: Diagnosis not present

## 2017-03-31 DIAGNOSIS — R700 Elevated erythrocyte sedimentation rate: Secondary | ICD-10-CM | POA: Insufficient documentation

## 2017-03-31 DIAGNOSIS — R768 Other specified abnormal immunological findings in serum: Secondary | ICD-10-CM | POA: Insufficient documentation

## 2017-03-31 NOTE — Progress Notes (Signed)
Office Visit Note  Patient: Tanya Rush             Date of Birth: April 09, 1946           MRN: [id number]             PCP: Mosie Lukes, MD Referring: Santiago Bumpers, M.D.  Visit Date: 04/03/2017 Occupation: '@GUAROCC' @    Subjective:  New Patient (Initial Visit) (r and left hand and shoulder pain for nov 2017)   History of Present Illness: Tanya Rush is a 71 y.o. female  seen in consultation per request of Dr. Rush Farmer. According to patient she fell and fractured her pelvis in November 2017. After that she started having pain in her bilateral shoulders. Recently she's been having pain in her right hand and wrist joint. She has been seeing Dr. Rush Farmer. About a month ago she had left shoulder joint cortisone injection which helped only for 1 week. She also had right although cortisone injection which lasted for one week. She denies any joint swelling.  Activities of Daily Living:  Patient reports mor for synchronousning stiffness for 0 minute.   Patient Reports nocturnal pain. Shoulder pain Difficulty dressing/grooming: Denies Difficulty climbing stairs: Reports due to generalized weakness Difficulty getting out of chair: Reports Difficulty using hands for taps, buttons, cutlery, and/or writing: Denies   Review of Systems  Constitutional: Positive for fatigue. Negative for night sweats, weight gain, weight loss and weakness.  HENT: Positive for mouth dryness. Negative for mouth sores, trouble swallowing, trouble swallowing and nose dryness.   Eyes: Positive for dryness. Negative for pain, redness and visual disturbance.  Respiratory: Positive for shortness of breath. Negative for cough and difficulty breathing.   Cardiovascular: Negative for chest pain, palpitations, hypertension, irregular heartbeat and swelling in legs/feet.  Gastrointestinal: Negative for blood in stool, constipation and diarrhea.  Endocrine: Negative for increased urination.  Genitourinary:  Negative for vaginal dryness.  Musculoskeletal: Positive for arthralgias and joint pain. Negative for joint swelling, myalgias, muscle weakness, muscle tenderness and myalgias.  Skin: Negative for color change, rash, hair loss, skin tightness, ulcers and sensitivity to sunlight.  Allergic/Immunologic: Negative for susceptible to infections.  Neurological: Negative for dizziness, memory loss and night sweats.  Hematological: Negative for swollen glands.  Psychiatric/Behavioral: Positive for sleep disturbance. Negative for depressed mood. The patient is not nervous/anxious.     PMFS History:  Patient Active Problem List   Diagnosis Date Noted  . ANA positive 03/31/2017  . Elevated sed rate 03/31/2017  . Rheumatoid factor positive 03/31/2017  . Chronic left shoulder pain 12/23/2016  . Chronic right shoulder pain 11/25/2016  . Fracture of superior rim of left pubis, subsequent encounter for fracture with delayed healing 11/25/2016  . Preventative health care 05/12/2016  . Osteoporosis 05/14/2015  . Unsteady gait 05/14/2015  . Memory impairment 11/13/2014  . Hyperglycemia 05/04/2014  . Medicare annual wellness visit, subsequent 05/04/2014  . Insomnia 05/04/2014  . Overactive bladder 05/04/2014  . Sun-damaged skin 04/04/2013  . Elevated BP 09/28/2012  . Arthralgia 05/03/2012  . Hyperlipidemia, mixed 09/30/2011  . COPD (chronic obstructive pulmonary disease) ([place]) 07/06/2008  . GENITAL HERPES 06/28/2008  . LEG CRAMPS 06/28/2008    Past Medical History:  Diagnosis Date  . Asthma   . COPD (chronic obstructive pulmonary disease) ([place])    severe; O2 dependent-- followed [facility] pulm at [facility]  . Elevated BP 09/28/2012  . Genital herpes   . History of ovarian cyst   . Medicare annual  wellness visit, subsequent 05/04/2014  . Memory impairment 11/13/2014  . Osteoporosis 05/14/2015  . Overactive bladder 05/04/2014  . Scabies 01/16/2013  . Sun-damaged skin 04/04/2013  . Unsteady gait 05/14/2015      Family History  Problem Relation Age of Onset  . Heart disease Mother   . Stroke Mother   . Arthritis Father        rheumatoid  . Lymphoma Father   . COPD Father        smoker  . Alcohol abuse Brother        TBI  . Birth defects Brother   . Alcohol abuse Son   . Alcohol abuse Son        recovering x 3 years  . Reye's syndrome Brother   . Birth defects Brother   . Hip fracture Maternal Aunt   . Cancer Neg Hx    History reviewed. No pertinent surgical history. Social History   Social History Narrative   3 children: 2 adopted, 1 biological           Objective: Vital Signs: BP 140/77 (BP Location: Right Arm, Patient Position: Sitting, Cuff Size: Normal)   Pulse 91   Ht '4\' 11"'  (1.499 m)   Wt 130 lb (59 kg)   BMI 26.26 kg/m    Physical Exam  Constitutional: She is oriented to person, place, and time. She appears well-developed and well-nourished.  HENT:  Head: Normocephalic and atraumatic.  Eyes: Conjunctivae and EOM are normal.  Neck: Normal range of motion.  Cardiovascular: Normal rate, regular rhythm, normal heart sounds and intact distal pulses.   Pulmonary/Chest: Effort normal and breath sounds normal.  On oxygen  Abdominal: Soft. Bowel sounds are normal.  Lymphadenopathy:    She has no cervical adenopathy.  Neurological: She is alert and oriented to person, place, and time.  Skin: Skin is warm and dry. Capillary refill takes less than 2 seconds.  Psychiatric: She has a normal mood and affect. Her behavior is normal.  Nursing note and vitals reviewed.    Musculoskeletal Exam: C-spine and thoracic spine good range of motion. She has some thoracic kyphosis. Lumbar spine limited range of motion. Shoulder joints fairly good range of motion with some discomfort. Elbow joints were good range of motion. She had tenderness on palpation of her right wrist joint but no synovitis was noted. No synovitis noted over left wrist joint. No MCP PIP/DIP swelling was noted.  Hip joints knee joints ankles MTPs PIPs with good range of motion with no synovitis.   CDAI Exam: No CDAI exam completed.    Investigation: Findings:  01/13/2017 ANA 1:40 nucleolar, Rheumatoid Factor 23,and Sed Rate (ESR) 31        Imaging: No results found.  Speciality Comments: No specialty comments available.    Procedures:  No procedures performed Allergies: Chocolate and Aspirin   Assessment / Plan:     Visit Diagnoses: Pain of right hand and right wrist. Patient gives history of discomfort in her right wrist joint but no swelling or synovitis was noted. Her x-ray showed some mild osteoarthritic changes. She has low titer ANA which is nonspecific. Her rheumatoid factor is borderline elevated. Her sedimentation rate is elevated. I'll obtain ultrasound of her right wrist joint to look for synovitis. Ultrasound examination did not show inflammatory arthritis. I believe her symptoms are coming from underlying osteoarthritis. If her symptoms persist a MR arthrogram may be useful to look for underlying pathology. I've advised patient to return if her  symptoms get worse.  Chronic right shoulder pain: The x-rays are unremarkable.  Chronic left shoulder pain: The x-rays are unremarkable. I believe she may benefit from physical therapy.  Rheumatoid factor positive: Low titer  Elevated sed rate: Mildly elevated  ANA positive: Nonspecific  Primary insomnia: Secondary to shoulder pain.  History of hyperlipidemia  Unsteady gait  History of COPD  Age-related osteoporosis  - 11/15/2015 DXA The BMD measured at AP Spine L2-L4 is 0.661 g/cm2 with a T-score of -4.5.  she needs aggressive treatment for osteoporosis. I discussed that she will benefit from Forteo injections to improve her bone mass which can be followed up by Reclast infusions. At this point they would like to discuss that further with Dr. Charlett Blake. Use of calcium and vitamin D and resistive exercise was also discussed.     Orders: Orders Placed This Encounter  Procedures  . [place] Extrem Up Right Comp   No orders of the defined types were placed in this encounter.   Face-to-face time spent with patient was 50 minutes.  greater than 50% of time was spent in counseling and coordination of care.  Follow-Up Instructions: Return if symptoms worsen or fail to improve.   Bo Merino, MD  Note - This record has been created using Editor, commissioning.  Chart creation errors have been sought, but may not always  have been located. Such creation errors do not reflect on  the standard of medical care.

## 2017-04-03 ENCOUNTER — Ambulatory Visit (INDEPENDENT_AMBULATORY_CARE_PROVIDER_SITE_OTHER): Payer: Medicare Other | Admitting: Rheumatology

## 2017-04-03 ENCOUNTER — Inpatient Hospital Stay (INDEPENDENT_AMBULATORY_CARE_PROVIDER_SITE_OTHER): Payer: Self-pay

## 2017-04-03 ENCOUNTER — Encounter: Payer: Self-pay | Admitting: Rheumatology

## 2017-04-03 VITALS — BP 140/77 | HR 91 | Ht 59.0 in | Wt 130.0 lb

## 2017-04-03 DIAGNOSIS — M810 Age-related osteoporosis without current pathological fracture: Secondary | ICD-10-CM

## 2017-04-03 DIAGNOSIS — Z8709 Personal history of other diseases of the respiratory system: Secondary | ICD-10-CM | POA: Diagnosis not present

## 2017-04-03 DIAGNOSIS — F5101 Primary insomnia: Secondary | ICD-10-CM

## 2017-04-03 DIAGNOSIS — G8929 Other chronic pain: Secondary | ICD-10-CM | POA: Diagnosis not present

## 2017-04-03 DIAGNOSIS — R700 Elevated erythrocyte sedimentation rate: Secondary | ICD-10-CM | POA: Diagnosis not present

## 2017-04-03 DIAGNOSIS — M25511 Pain in right shoulder: Secondary | ICD-10-CM

## 2017-04-03 DIAGNOSIS — R2681 Unsteadiness on feet: Secondary | ICD-10-CM | POA: Diagnosis not present

## 2017-04-03 DIAGNOSIS — M79641 Pain in right hand: Secondary | ICD-10-CM

## 2017-04-03 DIAGNOSIS — M25512 Pain in left shoulder: Secondary | ICD-10-CM | POA: Diagnosis not present

## 2017-04-03 DIAGNOSIS — R768 Other specified abnormal immunological findings in serum: Secondary | ICD-10-CM

## 2017-04-03 DIAGNOSIS — Z8639 Personal history of other endocrine, nutritional and metabolic disease: Secondary | ICD-10-CM

## 2017-04-03 NOTE — Patient Instructions (Signed)
, °

## 2017-04-11 DIAGNOSIS — R0902 Hypoxemia: Secondary | ICD-10-CM | POA: Diagnosis not present

## 2017-04-11 DIAGNOSIS — J449 Chronic obstructive pulmonary disease, unspecified: Secondary | ICD-10-CM | POA: Diagnosis not present

## 2017-04-12 ENCOUNTER — Other Ambulatory Visit: Payer: Self-pay | Admitting: Family Medicine

## 2017-04-14 NOTE — Telephone Encounter (Signed)
Pt must sched appt first/was advised to F/U 12d after OV in 06/2016 per SB/thx dmf

## 2017-04-15 ENCOUNTER — Other Ambulatory Visit: Payer: Self-pay | Admitting: Family Medicine

## 2017-04-15 NOTE — Telephone Encounter (Signed)
Patient needs to sched appt first/was advised in Nov to F/U in 12d but has not RTN to see SB/thx dmf

## 2017-04-17 IMAGING — DX DG CHEST 2V
2 series · 2 of 2 positions shown · non-contrast
Comparison: Chest x-ray of 04/08/2010, and CT of the chest of
01/17/2009

CLINICAL DATA: Cough for several days

EXAM:
CHEST  2 VIEW

[chest lat]
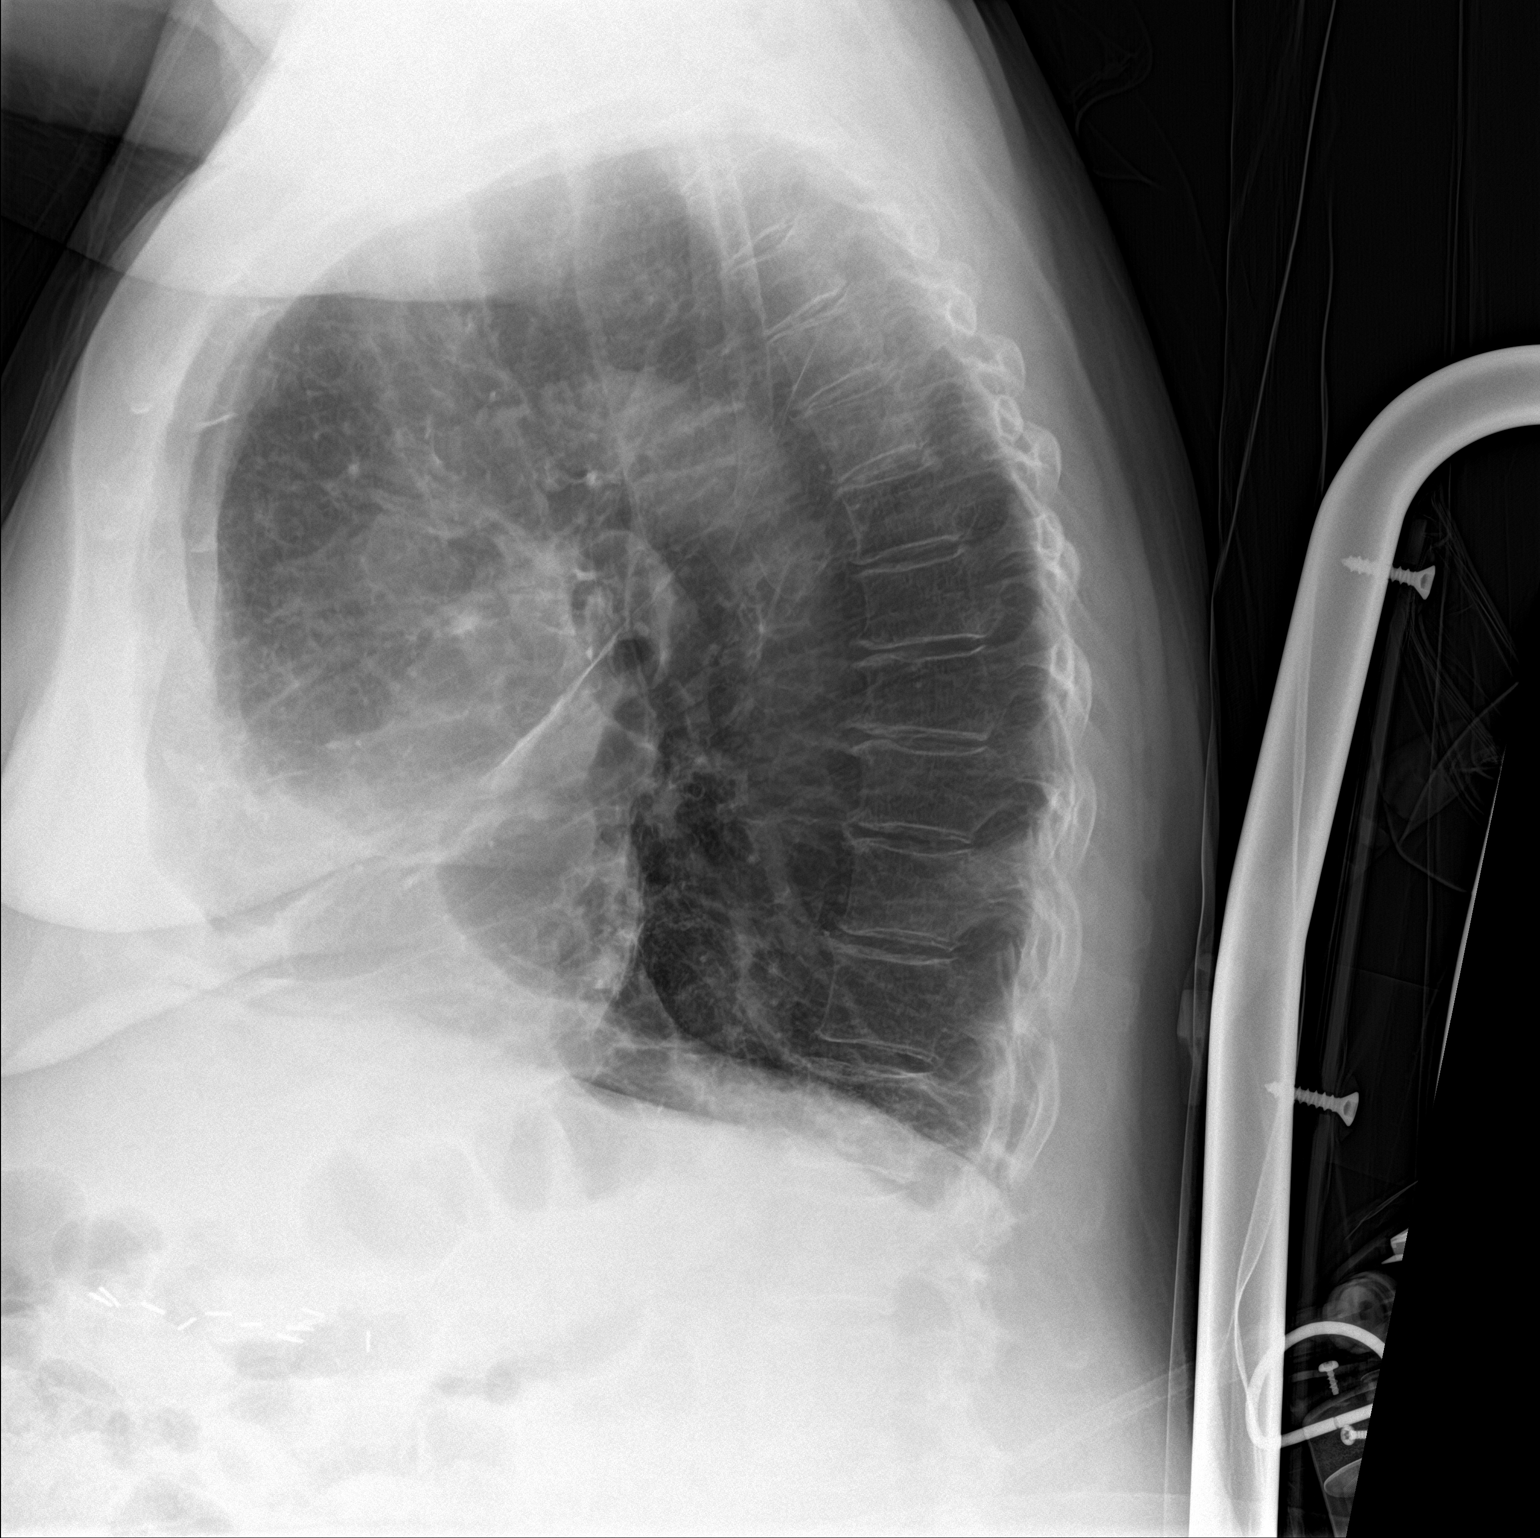

[chest ap]
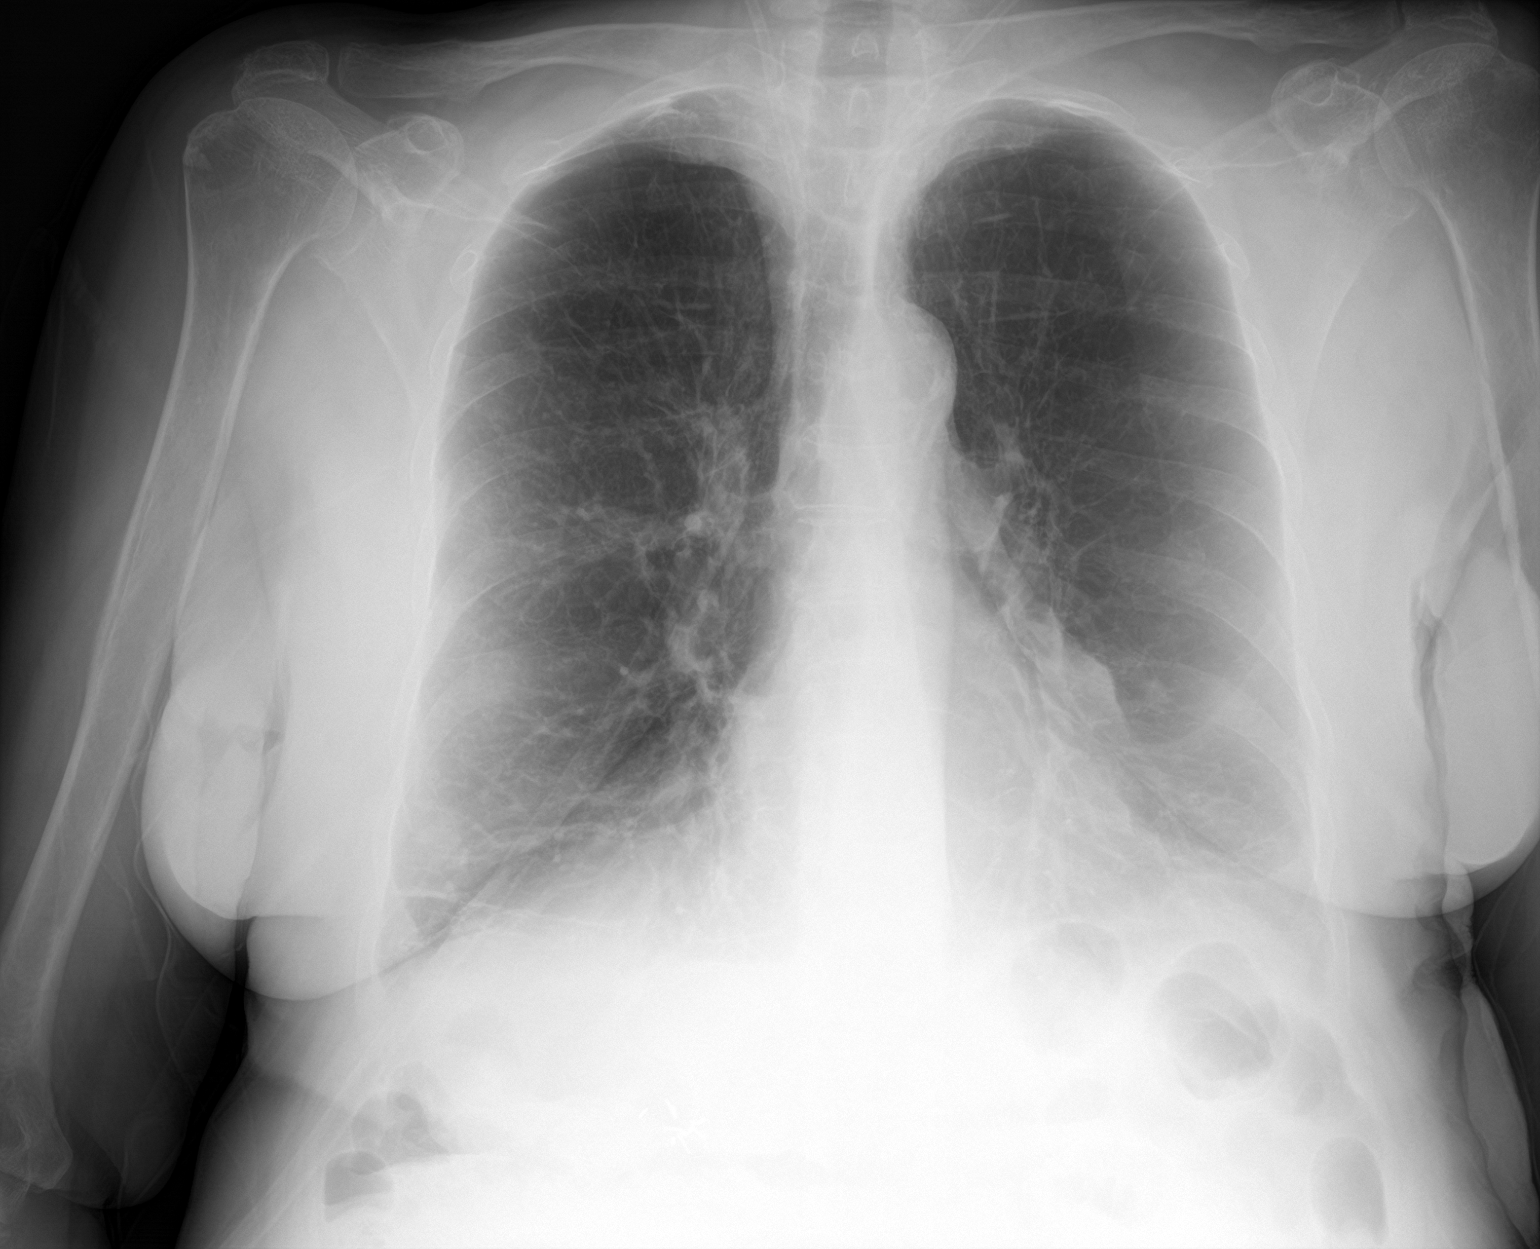

[2 of 2 positions shown; findings below may reference images not displayed]

FINDINGS: The lungs are clear but somewhat hyperaerated with minimal basilar
atelectasis or scarring noted. The hyper aeration suggest emphysema
with increased AP diameter. No pneumonia or effusion is seen.
Mediastinal and hilar contours are unremarkable. The heart is within
normal limits in size. No acute bony abnormality is seen.
IMPRESSION: Hyperaeration consistent with emphysema. Mild basilar atelectasis or
scarring. No definite active process.

## 2017-04-20 IMAGING — DX DG PELVIS 1-2V
1 series · 1 of 1 positions shown · non-contrast
Comparison: CT, 04/08/2010

CLINICAL DATA: Closed fracture of left pubis with routine healing,
follow up fracture seen on ct from [REDACTED] Pelvic pain when
ambulating, pain radiating down left leg

EXAM:
PELVIS - 1-2 VIEW

[pelvis ap]
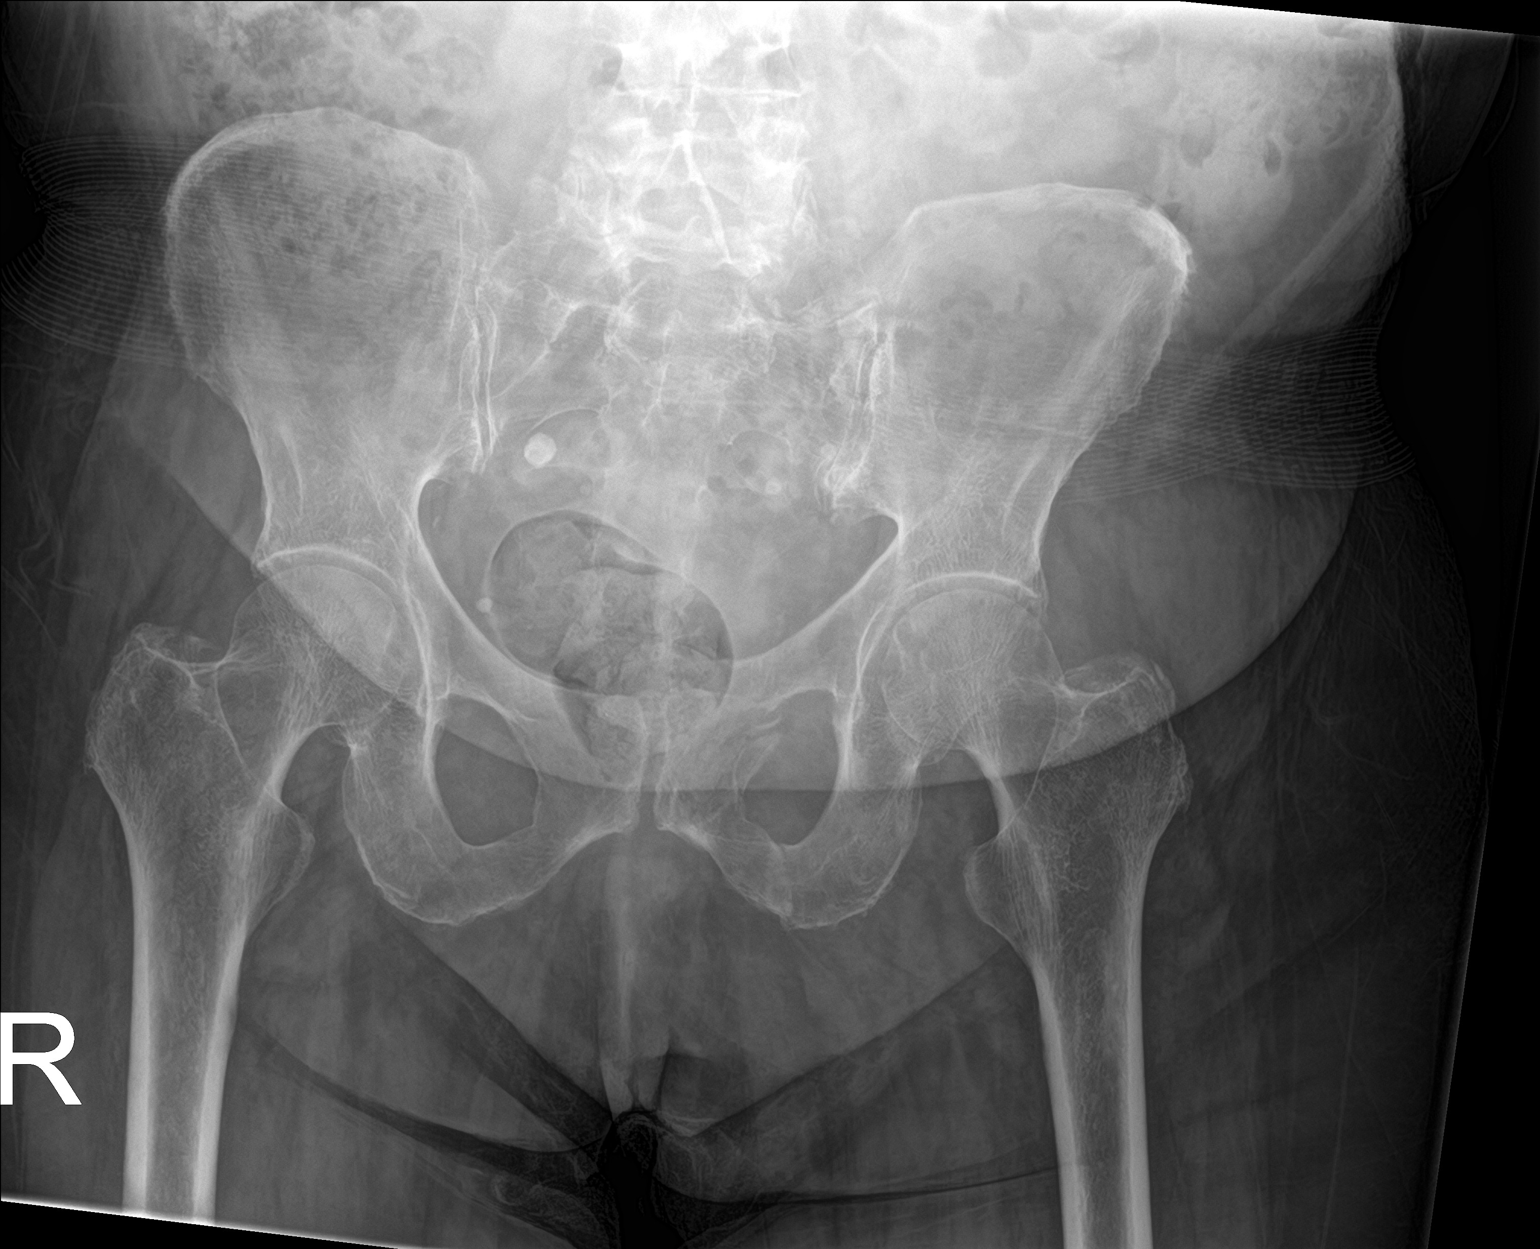

[1 of 1 positions shown; findings below may reference images not displayed]

FINDINGS: There are fractures of the left superior and inferior pubic rami.
The superior fracture lies adjacent to the pubic symphysis. It is
displaced approximately 6 mm. It is mildly comminuted. The inferior
pubic ramus fracture is nondisplaced and non comminuted.

No other fractures. Hip joints, SI joints and pubic symphysis are
normally aligned.

Bones are demineralized.
IMPRESSION: Left superior inferior pubic rami fractures as described.

## 2017-04-21 DIAGNOSIS — J449 Chronic obstructive pulmonary disease, unspecified: Secondary | ICD-10-CM | POA: Diagnosis not present

## 2017-04-26 ENCOUNTER — Other Ambulatory Visit: Payer: Self-pay | Admitting: Family Medicine

## 2017-04-30 ENCOUNTER — Ambulatory Visit: Payer: Medicare Other | Admitting: Rheumatology

## 2017-05-02 ENCOUNTER — Ambulatory Visit (HOSPITAL_BASED_OUTPATIENT_CLINIC_OR_DEPARTMENT_OTHER)
Admission: RE | Admit: 2017-05-02 | Discharge: 2017-05-02 | Disposition: A | Discharge status: Home / Self Care | Payer: Medicare Other | Source: Ambulatory Visit | Attending: Family Medicine | Admitting: Family Medicine

## 2017-05-02 ENCOUNTER — Encounter: Payer: Self-pay | Admitting: Family Medicine

## 2017-05-02 ENCOUNTER — Ambulatory Visit (INDEPENDENT_AMBULATORY_CARE_PROVIDER_SITE_OTHER): Payer: Medicare Other | Admitting: Family Medicine

## 2017-05-02 DIAGNOSIS — J42 Unspecified chronic bronchitis: Secondary | ICD-10-CM | POA: Diagnosis not present

## 2017-05-02 DIAGNOSIS — R109 Unspecified abdominal pain: Secondary | ICD-10-CM | POA: Diagnosis not present

## 2017-05-02 DIAGNOSIS — R197 Diarrhea, unspecified: Secondary | ICD-10-CM | POA: Diagnosis not present

## 2017-05-02 DIAGNOSIS — R739 Hyperglycemia, unspecified: Secondary | ICD-10-CM | POA: Diagnosis not present

## 2017-05-02 DIAGNOSIS — I700 Atherosclerosis of aorta: Secondary | ICD-10-CM | POA: Diagnosis not present

## 2017-05-02 HISTORY — DX: Diarrhea, unspecified: R19.7

## 2017-05-02 LAB — COMPREHENSIVE METABOLIC PANEL
ALT: 8 U/L (ref 0–35)
AST: 13 U/L (ref 0–37)
Albumin: 4.1 g/dL (ref 3.5–5.2)
Alkaline Phosphatase: 41 U/L (ref 39–117)
BILIRUBIN TOTAL: 0.2 mg/dL (ref 0.2–1.2)
BUN: 17 mg/dL (ref 6–23)
CO2: 38 mEq/L — ABNORMAL HIGH (ref 19–32)
Calcium: 9.6 mg/dL (ref 8.4–10.5)
Chloride: 94 mEq/L — ABNORMAL LOW (ref 96–112)
Creatinine, Ser: 0.72 mg/dL (ref 0.40–1.20)
GFR: 85.09 mL/min (ref >60.00)
GLUCOSE: 99 mg/dL (ref 70–99)
POTASSIUM: 4.1 meq/L (ref 3.5–5.1)
SODIUM: 136 meq/L (ref 135–145)
TOTAL PROTEIN: 6.8 g/dL (ref 6.0–8.3)

## 2017-05-02 LAB — CBC WITH DIFFERENTIAL/PLATELET
BASOS ABS: 0 10*3/uL (ref 0.0–0.1)
Basophils Relative: 0.4 % (ref 0.0–3.0)
EOS ABS: 0.7 10*3/uL (ref 0.0–0.7)
Eosinophils Relative: 6.2 % — ABNORMAL HIGH (ref 0.0–5.0)
HCT: 34.9 % — ABNORMAL LOW (ref 36.0–46.0)
Hemoglobin: 10.9 g/dL — ABNORMAL LOW (ref 12.0–15.0)
LYMPHS ABS: 1.8 10*3/uL (ref 0.7–4.0)
Lymphocytes Relative: 16.6 % (ref 12.0–46.0)
MCHC: 31.2 g/dL (ref 30.0–36.0)
MCV: 103.2 fl — ABNORMAL HIGH (ref 78.0–100.0)
MONO ABS: 1.1 10*3/uL — AB (ref 0.1–1.0)
Monocytes Relative: 10.2 % (ref 3.0–12.0)
NEUTROS PCT: 66.6 % (ref 43.0–77.0)
Neutro Abs: 7.3 10*3/uL (ref 1.4–7.7)
Platelets: 264 10*3/uL (ref 150.0–400.0)
RBC: 3.38 Mil/uL — AB (ref 3.87–5.11)
RDW: 13.9 % (ref 11.5–15.5)
WBC: 11 10*3/uL — ABNORMAL HIGH (ref 4.0–10.5)

## 2017-05-02 LAB — SEDIMENTATION RATE: Sed Rate: 10 mm/hr (ref 0–30)

## 2017-05-02 NOTE — Progress Notes (Signed)
Subjective:  I acted as a Education administrator for Dr. Rogue Jury, CMA   Patient ID: Tanya Rush, female    DOB: 1946/06/11, 71 y.o.   MRN: [id number]  Chief Complaint  Patient presents with  . Diarrhea    Pt states onset was 2 weeks. Has taken pepto with no relief. Pt states that she's not feelin dehydrated at this time and no abdomnial cramping or pain at this time. Slight nausea that started this morning  . Nausea    onset this morning    HPI  Patient is in today for diarrhea. She has ben having 3-6 loose stool daily for roughly 2 weeks. notess ome chills but denies fever. No bloody or tarry stool. Her husband has just started having abdominal cramping today. Has not tried any OTC meds. Denies CP/palp/SOB/HA/congestion/fevers or GU c/o. Taking meds as prescribed  Patient Care Team: Mosie Lukes, MD as PCP - General (Family Medicine)   Past Medical History:  Diagnosis Date  . Asthma   . COPD (chronic obstructive pulmonary disease) ([place])    severe; O2 dependent-- followed by pulm at [facility]  . Diarrhea 05/02/2017  . Elevated BP 09/28/2012  . Genital herpes   . History of ovarian cyst   . Medicare annual wellness visit, subsequent 05/04/2014  . Memory impairment 11/13/2014  . Osteoporosis 05/14/2015  . Overactive bladder 05/04/2014  . Scabies 01/16/2013  . Sun-damaged skin 04/04/2013  . Unsteady gait 05/14/2015    No past surgical history on file.  Family History  Problem Relation Age of Onset  . Heart disease Mother   . Stroke Mother   . Arthritis Father        rheumatoid  . Lymphoma Father   . COPD Father        smoker  . Alcohol abuse Brother        TBI  . Birth defects Brother   . Alcohol abuse Son   . Alcohol abuse Son        recovering x 3 years  . Reye's syndrome Brother   . Birth defects Brother   . Hip fracture Maternal Aunt   . Cancer Neg Hx     Social History   Social History  . Marital status: Married    Spouse name: N/A  . Number of children: 3  .  Years of education: N/A   Occupational History  . Not on file.   Social History Main Topics  . Smoking status: Former Smoker    Years: 45.00    Quit date: 08/05/2006  . Smokeless tobacco: Never Used  . Alcohol use No  . Drug use: No  . Sexual activity: No     Comment: lives with husband, no dietary restrictions   Other Topics Concern  . Not on file   Social History Narrative   3 children: 2 adopted, 1 biological          Outpatient Medications Prior to Visit  Medication Sig Dispense Refill  . albuterol (PROAIR HFA) 108 (90 BASE) MCG/ACT inhaler Inhale 2 puffs into the lungs daily as needed.      Marland Kitchen atorvastatin (LIPITOR) 20 MG tablet TAKE 1 TABLET (20 MG TOTAL) BY MOUTH DAILY AT 6 PM. 90 tablet 1  . Fluticasone-Salmeterol (ADVAIR DISKUS) 250-50 MCG/DOSE AEPB Inhale 1 puff into the lungs every 12 (twelve) hours.     . hydrochlorothiazide (HYDRODIURIL) 25 MG tablet TAKE 1 TABLET (25 MG TOTAL) BY MOUTH DAILY. 90 tablet 1  .  lisinopril (PRINIVIL,ZESTRIL) 10 MG tablet Take 1 tablet (10 mg total) by mouth daily. 90 tablet 1  . Multiple Vitamin (MULTIVITAMIN) tablet Take 1 tablet by mouth daily.    Marland Kitchen oxybutynin (DITROPAN-XL) 10 MG 24 hr tablet Take 1 tablet (10 mg total) by mouth at bedtime. 90 tablet 3  . valACYclovir (VALTREX) 1000 MG tablet TAKE 1 TABLET EVERY DAY 90 tablet 1  . HYDROcodone-acetaminophen (NORCO/VICODIN) 5-325 MG tablet Take 1 tablet by mouth every 6 (six) hours as needed. (Patient not taking: Reported on 04/03/2017) 30 tablet 0   No facility-administered medications prior to visit.     Allergies  Allergen Reactions  . Chocolate     Other reaction(s): Unknown  . Aspirin     REACTION: sever chest pain    Review of Systems  Constitutional: Positive for chills. Negative for fever and malaise/fatigue.  HENT: Negative for congestion.   Eyes: Negative for blurred vision.  Respiratory: Negative for shortness of breath.   Cardiovascular: Negative for chest pain,  palpitations and leg swelling.  Gastrointestinal: Positive for abdominal pain, diarrhea and nausea. Negative for blood in stool, constipation, melena and vomiting.  Genitourinary: Negative for dysuria and frequency.  Musculoskeletal: Negative for falls.  Skin: Negative for rash.  Neurological: Negative for dizziness, loss of consciousness and headaches.  Endo/Heme/Allergies: Negative for environmental allergies.  Psychiatric/Behavioral: Negative for depression. The patient is not nervous/anxious.        Objective:    Physical Exam  Constitutional: She is oriented to person, place, and time. She appears well-developed and well-nourished. No distress.  HENT:  Head: Normocephalic and atraumatic.  Nose: Nose normal.  Eyes: Right eye exhibits no discharge. Left eye exhibits no discharge.  Neck: Normal range of motion. Neck supple.  Cardiovascular: Normal rate and regular rhythm.   No murmur heard. Pulmonary/Chest: Effort normal and breath sounds normal.  Abdominal: Soft. Bowel sounds are normal. There is no tenderness.  Musculoskeletal: She exhibits no edema.  Neurological: She is alert and oriented to person, place, and time.  Skin: Skin is warm and dry.  Psychiatric: She has a normal mood and affect.  Nursing note and vitals reviewed.   BP (!) 101/54   Pulse 85   Temp 98.7 F (37.1 C) (Oral)   Ht 4\' 11"  (1.499 m)   Wt 124 lb (56.2 kg)   SpO2 99%   BMI 25.04 kg/m  Wt Readings from Last 3 Encounters:  05/02/17 124 lb (56.2 kg)  04/03/17 130 lb (59 kg)  01/13/17 127 lb 6.4 oz (57.8 kg)   BP Readings from Last 3 Encounters:  05/02/17 (!) 101/54  04/03/17 140/77  01/13/17 (!) 127/55     Immunization History  Administered Date(s) Administered  . Influenza Nasal 05/31/2013  . Influenza Whole 04/10/2010  . Influenza, High Dose Seasonal PF 04/04/2016  . Influenza,inj,Quad PF,6+ Mos 05/02/2014  . Influenza-Unspecified 08/16/2010, 08/06/2011, 05/11/2015  . Pneumococcal  Conjugate-13 05/02/2014  . Pneumococcal Polysaccharide-23 06/23/2007, 11/09/2015  . Td 03/20/2010    Health Maintenance  Topic Date Due  . Hepatitis C Screening  11/19/1945  . MAMMOGRAM  12/05/2016  . INFLUENZA VACCINE  03/05/2017  . COLONOSCOPY  01/11/2019  . TETANUS/TDAP  03/20/2020  . DEXA SCAN  Completed  . PNA vac Low Risk Adult  Completed    Lab Results  Component Value Date   WBC 11.0 (H) 05/02/2017   HGB 10.9 (L) 05/02/2017   HCT 34.9 (L) 05/02/2017   PLT 264.0 05/02/2017   GLUCOSE  99 05/02/2017   CHOL 167 05/10/2016   TRIG 99 05/10/2016   HDL 75 05/10/2016   LDLDIRECT 147.1 06/28/2008   LDLCALC 72 05/10/2016   ALT 8 05/02/2017   AST 13 05/02/2017   NA 136 05/02/2017   K 4.1 05/02/2017   CL 94 (L) 05/02/2017   CREATININE 0.72 05/02/2017   BUN 17 05/02/2017   CO2 38 (H) 05/02/2017   TSH 0.45 05/10/2016   HGBA1C 5.6 05/10/2016    Lab Results  Component Value Date   TSH 0.45 05/10/2016   Lab Results  Component Value Date   WBC 11.0 (H) 05/02/2017   HGB 10.9 (L) 05/02/2017   HCT 34.9 (L) 05/02/2017   MCV 103.2 (H) 05/02/2017   PLT 264.0 05/02/2017   Lab Results  Component Value Date   NA 136 05/02/2017   K 4.1 05/02/2017   CO2 38 (H) 05/02/2017   GLUCOSE 99 05/02/2017   BUN 17 05/02/2017   CREATININE 0.72 05/02/2017   BILITOT 0.2 05/02/2017   ALKPHOS 41 05/02/2017   AST 13 05/02/2017   ALT 8 05/02/2017   PROT 6.8 05/02/2017   ALBUMIN 4.1 05/02/2017   CALCIUM 9.6 05/02/2017   GFR 85.09 05/02/2017   Lab Results  Component Value Date   CHOL 167 05/10/2016   Lab Results  Component Value Date   HDL 75 05/10/2016   Lab Results  Component Value Date   LDLCALC 72 05/10/2016   Lab Results  Component Value Date   TRIG 99 05/10/2016   Lab Results  Component Value Date   CHOLHDL 2.2 05/10/2016   Lab Results  Component Value Date   HGBA1C 5.6 05/10/2016         Assessment & Plan:   Problem List Items Addressed This Visit    COPD  (chronic obstructive pulmonary disease) ([place])    No recent flares in respiratory symptoms      Hyperglycemia   Diarrhea    Clear liquids x 24 hours with gatorade, check labs, get stool cultures. Then progress to [employer] and report worsening symptoms for further work up and/or seek further care if needed      Relevant Orders   CBC with Differential/Platelet (Completed)   Comprehensive metabolic panel (Completed)   Sedimentation rate (Completed)   Stool Culture   Stool, WBC/Lactoferrin   Giardia Antigen (Stool)   Ova and parasite examination   DG Abd 2 Views (Completed)   Clostridium Difficile by PCR      I have discontinued Ms. Rachels HYDROcodone-acetaminophen. I am also having her maintain her Fluticasone-Salmeterol, albuterol, multivitamin, lisinopril, oxybutynin, valACYclovir, atorvastatin, and hydrochlorothiazide.  No orders of the defined types were placed in this encounter.   CMA served as Education administrator during this visit. History, Physical and Plan performed by medical provider. Documentation and orders reviewed and attested to.  Penni Homans, MD

## 2017-05-02 NOTE — Patient Instructions (Signed)
  24 hours of clear fluids Followed by the BRAT diet (bananas, rice, applesauce, toast0  Diarrhea, Adult Diarrhea is when you have loose and water poop (stool) often. Diarrhea can make you feel weak and cause you to get dehydrated. Dehydration can make you tired and thirsty, make you have a dry mouth, and make it so you pee (urinate) less often. Diarrhea often lasts 2-3 days. However, it can last longer if it is a sign of something more serious. It is important to treat your diarrhea as told by your doctor. Follow these instructions at home: Eating and drinking  Follow these recommendations as told by your doctor:  Take an oral rehydration solution (ORS). This is a drink that is sold at pharmacies and stores.  Drink clear fluids, such as: ? Water. ? Ice chips. ? Diluted fruit juice. ? Low-calorie sports drinks.  Eat bland, easy-to-digest foods in small amounts as you are able. These foods include: ? Bananas. ? Applesauce. ? Rice. ? Low-fat (lean) meats. ? Toast. ? Crackers.  Avoid drinking fluids that have a lot of sugar or caffeine in them.  Avoid alcohol.  Avoid spicy or fatty foods.  General instructions   Drink enough fluid to keep your pee (urine) clear or pale yellow.  Wash your hands often. If you cannot use soap and water, use hand sanitizer.  Make sure that all people in your home wash their hands well and often.  Take over-the-counter and prescription medicines only as told by your doctor.  Rest at home while you get better.  Watch your condition for any changes.  Take a warm bath to help with any burning or pain from having diarrhea.  Keep all follow-up visits as told by your doctor. This is important. Contact a doctor if:  You have a fever.  Your diarrhea gets worse.  You have new symptoms.  You cannot keep fluids down.  You feel light-headed or dizzy.  You have a headache.  You have muscle cramps. Get help right away if:  You have chest  pain.  You feel very weak or you pass out (faint).  You have bloody or black poop or poop that look like tar.  You have very bad pain, cramping, or bloating in your belly (abdomen).  You have trouble breathing or you are breathing very quickly.  Your heart is beating very quickly.  Your skin feels cold and clammy.  You feel confused.  You have signs of dehydration, such as: ? Dark pee, hardly any pee, or no pee. ? Cracked lips. ? Dry mouth. ? Sunken eyes. ? Sleepiness. ? Weakness. This information is not intended to replace advice given to you by your health care provider. Make sure you discuss any questions you have with your health care provider. Document Released: 01/08/2008 Document Revised: 02/09/2016 Document Reviewed: 03/28/2015 Elsevier Interactive Patient Education  2018 [employer].

## 2017-05-04 NOTE — Assessment & Plan Note (Signed)
No recent flares in respiratory symptoms

## 2017-05-04 NOTE — Assessment & Plan Note (Signed)
Clear liquids x 24 hours with gatorade, check labs, get stool cultures. Then progress to [employer] and report worsening symptoms for further work up and/or seek further care if needed

## 2017-05-05 DIAGNOSIS — R197 Diarrhea, unspecified: Secondary | ICD-10-CM | POA: Diagnosis not present

## 2017-05-06 LAB — CLOSTRIDIUM DIFFICILE BY PCR: CDIFFPCR: NOT DETECTED

## 2017-05-06 LAB — FECAL LACTOFERRIN, QUANT
FECAL LACTOFERRIN: POSITIVE — AB
MICRO NUMBER: 81085651
SPECIMEN QUALITY:: ADEQUATE

## 2017-05-06 LAB — OVA AND PARASITE EXAMINATION
CONCENTRATE RESULT:: NONE SEEN
MICRO NUMBER:: 81085649
SPECIMEN QUALITY:: ADEQUATE
TRICHROME RESULT: NONE SEEN

## 2017-05-06 MED ORDER — FLUCONAZOLE 150 MG PO TABS: ORAL_TABLET | ORAL | 0 refills | Status: AC

## 2017-05-06 NOTE — Addendum Note (Signed)
Addended by: Magdalene Molly A on: 05/06/2017 02:24 PM   Modules accepted: Orders

## 2017-05-08 LAB — GIARDIA ANTIGEN
MICRO NUMBER: 81085646
RESULT: NOT DETECTED
SPECIMEN QUALITY:: ADEQUATE

## 2017-05-09 LAB — STOOL CULTURE
MICRO NUMBER: 81085548
MICRO NUMBER:: 81085549
MICRO NUMBER:: 81085550
SHIGA RESULT:: NOT DETECTED
SPECIMEN QUALITY: ADEQUATE
SPECIMEN QUALITY: ADEQUATE
SPECIMEN QUALITY:: ADEQUATE

## 2017-05-21 DIAGNOSIS — J449 Chronic obstructive pulmonary disease, unspecified: Secondary | ICD-10-CM | POA: Diagnosis not present

## 2017-05-23 ENCOUNTER — Telehealth: Payer: Self-pay | Admitting: Emergency Medicine

## 2017-05-23 ENCOUNTER — Other Ambulatory Visit (INDEPENDENT_AMBULATORY_CARE_PROVIDER_SITE_OTHER): Payer: Medicare Other

## 2017-05-23 DIAGNOSIS — R197 Diarrhea, unspecified: Secondary | ICD-10-CM | POA: Diagnosis not present

## 2017-05-23 DIAGNOSIS — R195 Other fecal abnormalities: Secondary | ICD-10-CM

## 2017-05-23 LAB — FECAL OCCULT BLOOD, IMMUNOCHEMICAL: Fecal Occult Bld: POSITIVE — AB

## 2017-05-23 NOTE — Telephone Encounter (Signed)
Notified pt and spouse. Pt is reluctant to proceed with GI referral and importance of referral was discussed. Pt states she has been taking ibuprofen for the pain in her shoulder and hands since "no one has found out what is causing that" but has agreed to stop them. Please advise what she should do for alternative pain medication?  Pt asked me to relay information to her spouse. Notified spouse of below and lab appt was scheduled for Monday at 1:15pm and future lab order has been entered. Spouse states that they will do lab and go from there as "pt has paid her dues and it is her decision if she wants to proceed with further testing". Referral has been signed.

## 2017-05-23 NOTE — Telephone Encounter (Signed)
"  CRITICAL VALUE STICKER  CRITICAL VALUE:IFOB///POSITIVE  RECEIVER (on-site recipient of call):Kristy P.  DATE & TIME NOTIFIED: 05-23-17//1515  MESSENGER (representative from lab):Karen MD NOTIFIED:   TIME OF NOTIFICATION:1530  RESPONSE:

## 2017-05-23 NOTE — Telephone Encounter (Signed)
IFOB + for blood.  She should see GI for further evaluation (pended referral).Please ask pt to return to the lab on Monday for CBC with diff.  Go to ER if she sees bright red blood in stool or black tarry stools or if she develops weakness/SOB.  Stop any anti-inflammatories if she is taking them.

## 2017-05-25 NOTE — Telephone Encounter (Signed)
I would recommend tylenol prn pain.

## 2017-05-26 ENCOUNTER — Other Ambulatory Visit (INDEPENDENT_AMBULATORY_CARE_PROVIDER_SITE_OTHER): Payer: Medicare Other

## 2017-05-26 ENCOUNTER — Encounter: Payer: Self-pay | Admitting: Gastroenterology

## 2017-05-26 DIAGNOSIS — R195 Other fecal abnormalities: Secondary | ICD-10-CM

## 2017-05-26 LAB — CBC WITH DIFFERENTIAL/PLATELET
Basophils Absolute: 0.1 10*3/uL (ref 0.0–0.1)
Basophils Relative: 0.5 % (ref 0.0–3.0)
Eosinophils Absolute: 0.6 10*3/uL (ref 0.0–0.7)
Eosinophils Relative: 4.8 % (ref 0.0–5.0)
HCT: 32.2 % — ABNORMAL LOW (ref 36.0–46.0)
Hemoglobin: 10.2 g/dL — ABNORMAL LOW (ref 12.0–15.0)
LYMPHS ABS: 1.5 10*3/uL (ref 0.7–4.0)
Lymphocytes Relative: 12.6 % (ref 12.0–46.0)
MCHC: 31.6 g/dL (ref 30.0–36.0)
MCV: 102.6 fl — ABNORMAL HIGH (ref 78.0–100.0)
MONO ABS: 1.1 10*3/uL — AB (ref 0.1–1.0)
Monocytes Relative: 9.3 % (ref 3.0–12.0)
NEUTROS PCT: 72.8 % (ref 43.0–77.0)
Neutro Abs: 8.7 10*3/uL — ABNORMAL HIGH (ref 1.4–7.7)
Platelets: 300 10*3/uL (ref 150.0–400.0)
RBC: 3.14 Mil/uL — AB (ref 3.87–5.11)
RDW: 13.2 % (ref 11.5–15.5)
WBC: 11.9 10*3/uL — ABNORMAL HIGH (ref 4.0–10.5)

## 2017-05-26 NOTE — Telephone Encounter (Signed)
We can refer to Dr Collene Mares or someone else in [place] if cannot get in with [facility] til December.if all of that is too far out consider [facility]

## 2017-05-26 NOTE — Telephone Encounter (Signed)
Please contact patient and let her know that her blood count has dropped slightly from 10.9-10.2. I would recommend follow through with GI referral.

## 2017-05-26 NOTE — Telephone Encounter (Signed)
Dr. Charlett Blake- FYI.  Thanks.

## 2017-05-26 NOTE — Telephone Encounter (Signed)
Notified pt's spouse. He states pt has scheduled 1st available GI appt but it's not until 07/16/17 and they have placed pt on a wait list. Should we try a different group?  Spouse asked if pt could take hydrocodone from old Rx that she has and advised him per verbal from NP, just do regular tylenol and he voices understanding.

## 2017-05-27 IMAGING — DX DG PELVIS 1-2V
1 series · 1 of 1 positions shown · non-contrast
Comparison: 07/06/2016 .

CLINICAL DATA: Fracture.

EXAM:
PELVIS - 1-2 VIEW

[standing ap pelvis]
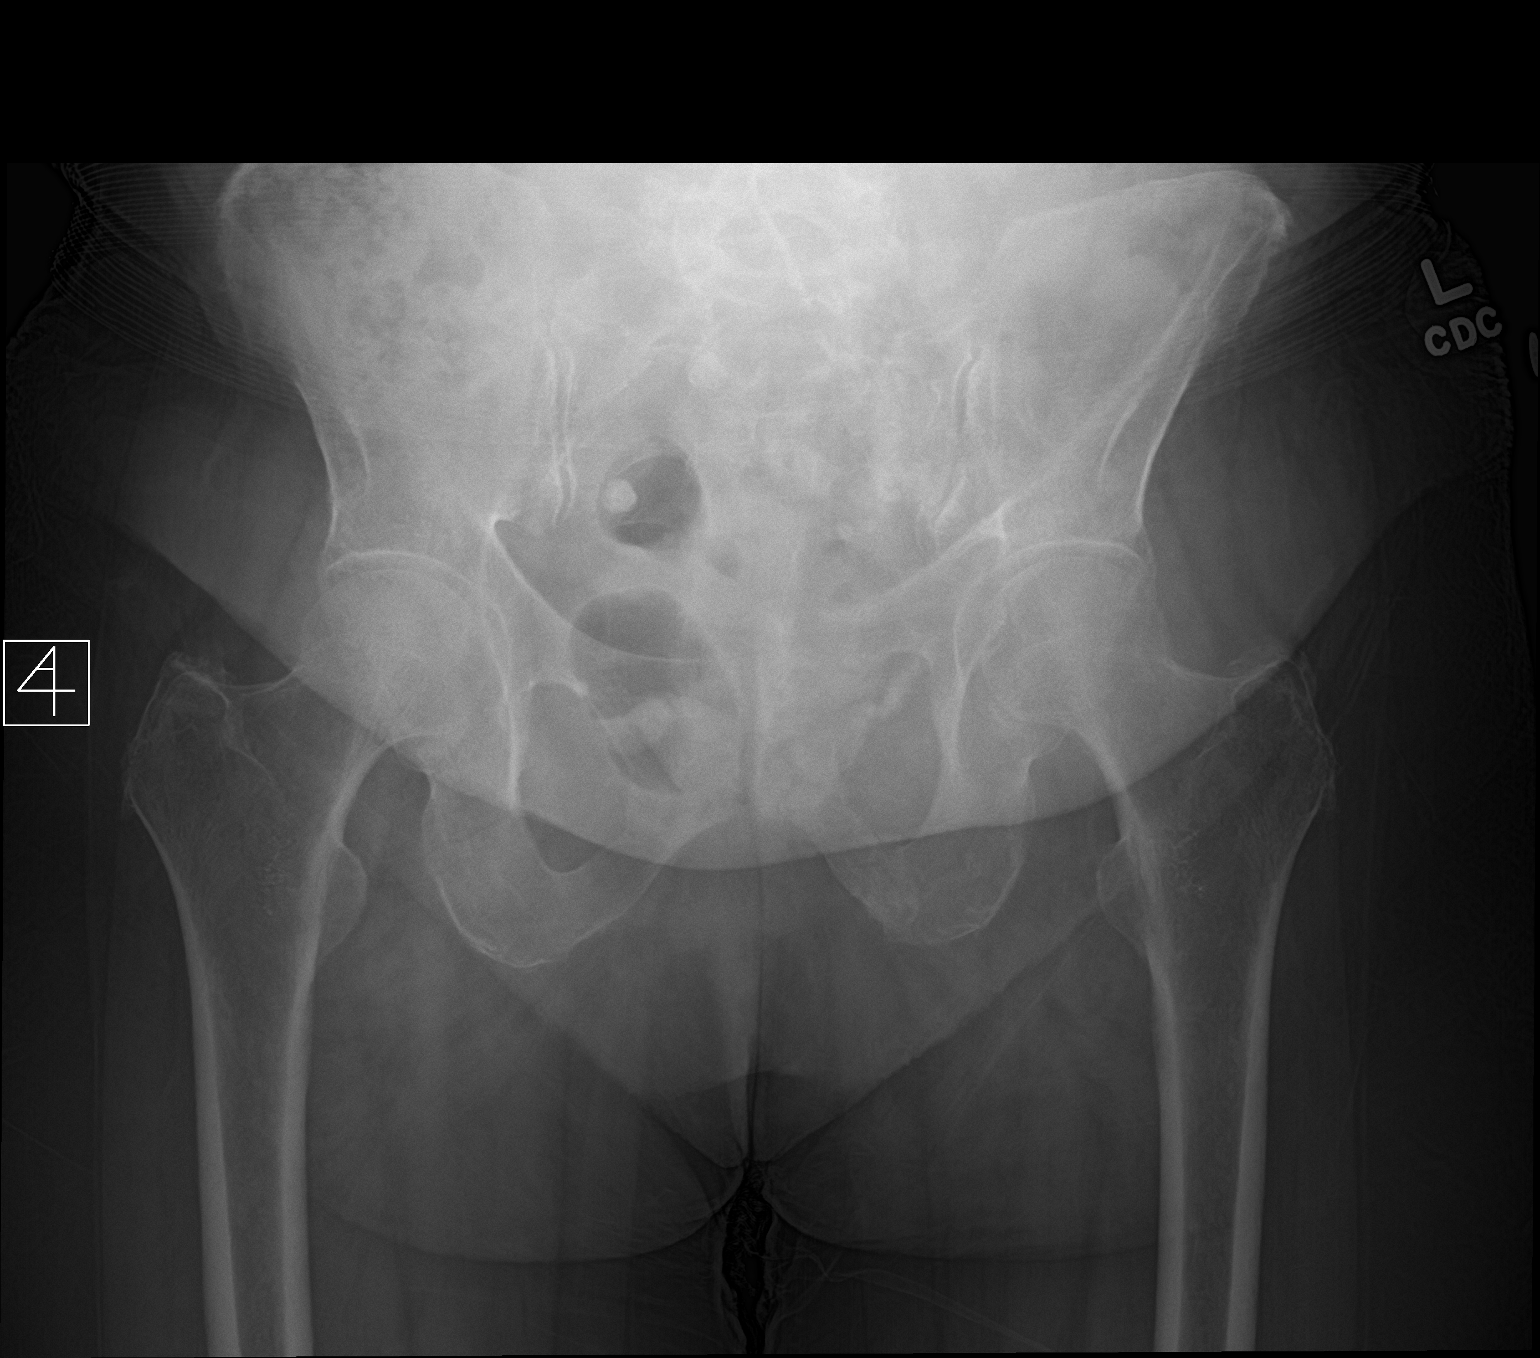

[1 of 1 positions shown; findings below may reference images not displayed]

FINDINGS: Fractures of the superior and inferior pubic rami on the left noted.
These appear unchanged. Degenerative changes lumbar spine and both
hips. Calcified pelvic densities noted consistent phleboliths.
IMPRESSION: 1. Fractures of the left superior and inferior pubic rami. No change
from prior exam.

2. Degenerative changes lumbar spine and both hips.

3. Aortoiliac atherosclerotic vascular disease.

## 2017-05-27 NOTE — Telephone Encounter (Signed)
Are there any sooner appts w/ [facility]?

## 2017-05-27 NOTE — Telephone Encounter (Signed)
Appointment scheduled with Tye Savoy on 06/05/17 @ 1:30pm

## 2017-06-05 ENCOUNTER — Ambulatory Visit (INDEPENDENT_AMBULATORY_CARE_PROVIDER_SITE_OTHER)
Admission: RE | Admit: 2017-06-05 | Discharge: 2017-06-05 | Disposition: A | Discharge status: Home / Self Care | Payer: Medicare Other | Source: Ambulatory Visit | Attending: Nurse Practitioner | Admitting: Nurse Practitioner

## 2017-06-05 ENCOUNTER — Ambulatory Visit (INDEPENDENT_AMBULATORY_CARE_PROVIDER_SITE_OTHER): Payer: Medicare Other | Admitting: Nurse Practitioner

## 2017-06-05 ENCOUNTER — Encounter: Payer: Self-pay | Admitting: Nurse Practitioner

## 2017-06-05 ENCOUNTER — Other Ambulatory Visit (INDEPENDENT_AMBULATORY_CARE_PROVIDER_SITE_OTHER): Payer: Medicare Other

## 2017-06-05 VITALS — BP 88/66 | HR 62 | Ht 59.0 in | Wt 119.4 lb

## 2017-06-05 DIAGNOSIS — R197 Diarrhea, unspecified: Secondary | ICD-10-CM

## 2017-06-05 DIAGNOSIS — R195 Other fecal abnormalities: Secondary | ICD-10-CM | POA: Diagnosis not present

## 2017-06-05 DIAGNOSIS — D539 Nutritional anemia, unspecified: Secondary | ICD-10-CM | POA: Diagnosis not present

## 2017-06-05 LAB — CBC
HCT: 31 % — ABNORMAL LOW (ref 36.0–46.0)
Hemoglobin: 10 g/dL — ABNORMAL LOW (ref 12.0–15.0)
MCHC: 32.2 g/dL (ref 30.0–36.0)
MCV: 102.7 fl — AB (ref 78.0–100.0)
PLATELETS: 342 10*3/uL (ref 150.0–400.0)
RBC: 3.02 Mil/uL — ABNORMAL LOW (ref 3.87–5.11)
RDW: 13.2 % (ref 11.5–15.5)
WBC: 13.4 10*3/uL — ABNORMAL HIGH (ref 4.0–10.5)

## 2017-06-05 LAB — BASIC METABOLIC PANEL
BUN: 16 mg/dL (ref 6–23)
CHLORIDE: 91 meq/L — AB (ref 96–112)
CO2: 41 mEq/L — ABNORMAL HIGH (ref 19–32)
Calcium: 9.6 mg/dL (ref 8.4–10.5)
Creatinine, Ser: 0.64 mg/dL (ref 0.40–1.20)
GFR: 97.45 mL/min (ref >60.00)
Glucose, Bld: 109 mg/dL — ABNORMAL HIGH (ref 70–99)
Potassium: 4.1 mEq/L (ref 3.5–5.1)
SODIUM: 136 meq/L (ref 135–145)

## 2017-06-05 LAB — TSH: TSH: 0.1 u[IU]/mL — AB (ref 0.35–4.50)

## 2017-06-05 NOTE — Patient Instructions (Signed)
If you are age 70 or older, your body mass index should be between 23-30. Your Body mass index is 24.11 kg/m. If this is out of the aforementioned range listed, please consider follow up with your Primary Care Provider.  If you are age 44 or younger, your body mass index should be between 19-25. Your Body mass index is 24.11 kg/m. If this is out of the aformentioned range listed, please consider follow up with your Primary Care Provider.   Your physician has requested that you go to the basement for the following lab work before leaving today: TSH CBC BMET  Your physician has requested that you go to the basement for an X-ray before leaving today: KUB  Will call with results.  Thank you for choosing me and [facility].   Tye Savoy, NP

## 2017-06-05 NOTE — Progress Notes (Addendum)
Chief complaint  diarrhea  HPI: Patient is a 71 year old female with COPD, on home 02, referred by Dr. Charlett Blake for diarrhea and heme + stools. Diarrhea started 4 weeks ago. Stool watery, non-bloody. A few weeks prior to onset of diarrhea she was constipated but took MOM and bowels moved normally for a few weeks before the diarrhea started.  No recent medication changes. No dietary changes. She was having 8-10 loose large volume stools a day, including nocturnal BMs but after starting imodium she is now having only 2-3 loose BMs / day. She does admit to episodes of fecal incontinence since diarrhea started. No fevers. No arthralgias. WBC mildly elevated at 11.9. ESR normal at 10. Eating and drinking okay.  She has had mild weight loss. Hgb baseline since December has been around 10.5 . Recent hgb stable at 10.2.    Past Medical History:  Diagnosis Date  . Asthma   . COPD (chronic obstructive pulmonary disease) ([place])    severe; O2 dependent-- followed by pulm at [facility]  . Diarrhea 05/02/2017  . Elevated BP 09/28/2012  . Genital herpes   . History of ovarian cyst   . Medicare annual wellness visit, subsequent 05/04/2014  . Memory impairment 11/13/2014  . Osteoporosis 05/14/2015  . Overactive bladder 05/04/2014  . Scabies 01/16/2013  . Sun-damaged skin 04/04/2013  . Unsteady gait 05/14/2015     Past Surgical History:  Procedure Laterality Date  . APPENDECTOMY    . CESAREAN SECTION    . CYSTECTOMY    . OOPHORECTOMY Left   . TONSILECTOMY/ADENOIDECTOMY WITH MYRINGOTOMY     Family History  Problem Relation Age of Onset  . Heart disease Mother   . Stroke Mother   . Arthritis Father        rheumatoid  . Lymphoma Father   . COPD Father        smoker  . Alcohol abuse Brother        TBI  . Birth defects Brother   . Alcohol abuse Son   . Alcohol abuse Son        recovering x 3 years  . Reye's syndrome Brother   . Birth defects Brother   . Hip fracture Maternal Aunt   . Cancer Neg Hx     Social History  Substance Use Topics  . Smoking status: Former Smoker    Years: 45.00    Quit date: 08/05/2006  . Smokeless tobacco: Never Used  . Alcohol use No   Current Outpatient Prescriptions  Medication Sig Dispense Refill  . albuterol (PROAIR HFA) 108 (90 BASE) MCG/ACT inhaler Inhale 2 puffs into the lungs daily as needed.      Marland Kitchen atorvastatin (LIPITOR) 20 MG tablet TAKE 1 TABLET (20 MG TOTAL) BY MOUTH DAILY AT 6 PM. 90 tablet 1  . fluconazole (DIFLUCAN) 150 MG tablet Take 2 tablets po daily for 3 days Then take 1 tablet po once a week for 3 weeks 9 tablet 0  . Fluticasone-Salmeterol (ADVAIR DISKUS) 250-50 MCG/DOSE AEPB Inhale 1 puff into the lungs every 12 (twelve) hours.     . hydrochlorothiazide (HYDRODIURIL) 25 MG tablet TAKE 1 TABLET (25 MG TOTAL) BY MOUTH DAILY. 90 tablet 1  . lisinopril (PRINIVIL,ZESTRIL) 10 MG tablet Take 1 tablet (10 mg total) by mouth daily. 90 tablet 1  . Multiple Vitamin (MULTIVITAMIN) tablet Take 1 tablet by mouth daily.    Marland Kitchen oxybutynin (DITROPAN-XL) 10 MG 24 hr tablet Take 1 tablet (10 mg  total) by mouth at bedtime. 90 tablet 3  . valACYclovir (VALTREX) 1000 MG tablet TAKE 1 TABLET EVERY DAY 90 tablet 1   No current facility-administered medications for this visit.    Allergies  Allergen Reactions  . Chocolate     Other reaction(s): Unknown  . Aspirin     REACTION: sever chest pain     Review of Systems: All systems reviewed and negative except where noted in HPI.    Physical Exam: BP (!) 88/66   Pulse 62   Ht '4\' 11"'  (1.499 m)   Wt 119 lb 6 oz (54.1 kg)   BMI 24.11 kg/m  Constitutional:  Well-developed, white female in no acute distress. Psychiatric: Normal mood and affect. Behavior is normal. EENT: Pupils normal.  Conjunctivae are normal. No scleral icterus. Neck supple.  Cardiovascular: Normal rate, regular rhythm. No edema Pulmonary/chest: Effort normal and breath sounds normal. No wheezing, rales or rhonchi. Abdominal: Soft,  nondistended. Nontender. Bowel sounds active throughout. There are no masses palpable. No hepatomegaly. Lymphadenopathy: No cervical adenopathy noted. Neurological: Alert and oriented to person place and time. Skin: Skin is warm and dry. No rashes noted.   ASSESSMENT AND PLAN:  1. 71 yo female with 4 week history of non-bloody diarrhea. C-diff is negative, lactoferrin positive as is fecal occult blood. Hgb has remained around baseline (low 10 range). Patient absolutely does not want a rectal exam or colonoscopy and I agree that she is at increased risk for colonoscopy with severe COPD, 02 dependendce. Marland Kitchen Positive FOBT could just be from all the diarrhea but IBD, colon neoplasm not excluded. .  -Patient has intermittent constipation and so overflow diarrhea is possible. Will obtain KUB today to look for large stool burden  -If KUB negative then we could try a course of flagyl but if diarrhea persists / recurs or if hgb declines then she should reconsider colonoscopy and I explained this to her and her husband. Virtual colonoscopy or even CT scan could be done but any colonic findings would still call for a colonoscopy.  -Will call her with KUB results.  -CBC, check electrolytes and TSH  2. COPD, 02 dependent.   3.Macrocytic anemia. B12, folate normal in June. No ETOH use.   -check TSH  I spent 25 minutes of face-to-face time with the patient. Greater than 50% of the time was spent counseling and coordinating care. Questions answered   Tye Savoy, NP  06/05/2017, 1:53 PM  Cc: Mosie Lukes, MD   Addendum: Reviewed and agree with initial management. Pyrtle, Lajuan Lines, MD

## 2017-06-07 ENCOUNTER — Other Ambulatory Visit: Payer: Self-pay | Admitting: Family Medicine

## 2017-06-08 ENCOUNTER — Encounter: Payer: Self-pay | Admitting: Nurse Practitioner

## 2017-06-09 ENCOUNTER — Other Ambulatory Visit: Payer: Self-pay

## 2017-06-09 DIAGNOSIS — D72828 Other elevated white blood cell count: Secondary | ICD-10-CM

## 2017-06-11 ENCOUNTER — Telehealth: Payer: Self-pay | Admitting: Nurse Practitioner

## 2017-06-11 ENCOUNTER — Other Ambulatory Visit: Payer: Self-pay

## 2017-06-11 MED ORDER — METRONIDAZOLE 500 MG PO TABS: 500.0000 mg | ORAL_TABLET | Freq: Three times a day (TID) | ORAL | 0 refills | Status: AC

## 2017-06-11 NOTE — Telephone Encounter (Signed)
Spoke with the patient. She is advised of the imaging results (KUB) and she is willing to start Flagyl 500 mg tid for 7 days. She is not willing to schedule an appointment at this time. She states "what is the point if you do not know what is wrong with me?"

## 2017-06-21 DIAGNOSIS — J449 Chronic obstructive pulmonary disease, unspecified: Secondary | ICD-10-CM | POA: Diagnosis not present

## 2017-07-10 ENCOUNTER — Other Ambulatory Visit: Payer: Self-pay | Admitting: Family Medicine

## 2017-07-16 ENCOUNTER — Other Ambulatory Visit: Payer: Self-pay | Admitting: Family Medicine

## 2017-07-16 ENCOUNTER — Ambulatory Visit: Payer: Medicare Other | Admitting: Gastroenterology

## 2017-07-16 DIAGNOSIS — R030 Elevated blood-pressure reading, without diagnosis of hypertension: Secondary | ICD-10-CM

## 2017-07-21 DIAGNOSIS — J449 Chronic obstructive pulmonary disease, unspecified: Secondary | ICD-10-CM | POA: Diagnosis not present

## 2017-08-21 DIAGNOSIS — J449 Chronic obstructive pulmonary disease, unspecified: Secondary | ICD-10-CM | POA: Diagnosis not present

## 2017-08-29 ENCOUNTER — Other Ambulatory Visit: Payer: Self-pay | Admitting: Family Medicine

## 2017-08-29 DIAGNOSIS — R350 Frequency of micturition: Secondary | ICD-10-CM

## 2017-09-21 DIAGNOSIS — J449 Chronic obstructive pulmonary disease, unspecified: Secondary | ICD-10-CM | POA: Diagnosis not present

## 2017-10-10 ENCOUNTER — Other Ambulatory Visit: Payer: Self-pay | Admitting: Family Medicine

## 2017-10-19 DIAGNOSIS — J449 Chronic obstructive pulmonary disease, unspecified: Secondary | ICD-10-CM | POA: Diagnosis not present

## 2017-10-29 ENCOUNTER — Other Ambulatory Visit: Payer: Self-pay

## 2017-10-29 MED ORDER — ATORVASTATIN CALCIUM 20 MG PO TABS: 20.0000 mg | ORAL_TABLET | Freq: Every day | ORAL | 1 refills | Status: DC

## 2017-11-19 DIAGNOSIS — J449 Chronic obstructive pulmonary disease, unspecified: Secondary | ICD-10-CM | POA: Diagnosis not present

## 2017-11-26 ENCOUNTER — Other Ambulatory Visit: Payer: Self-pay | Admitting: Family Medicine

## 2017-11-26 DIAGNOSIS — J449 Chronic obstructive pulmonary disease, unspecified: Secondary | ICD-10-CM | POA: Diagnosis not present

## 2017-11-26 DIAGNOSIS — R0902 Hypoxemia: Secondary | ICD-10-CM | POA: Diagnosis not present

## 2017-12-19 DIAGNOSIS — J449 Chronic obstructive pulmonary disease, unspecified: Secondary | ICD-10-CM | POA: Diagnosis not present

## 2018-01-09 ENCOUNTER — Other Ambulatory Visit: Payer: Self-pay | Admitting: Family Medicine

## 2018-01-09 DIAGNOSIS — R030 Elevated blood-pressure reading, without diagnosis of hypertension: Secondary | ICD-10-CM

## 2018-01-19 DIAGNOSIS — J449 Chronic obstructive pulmonary disease, unspecified: Secondary | ICD-10-CM | POA: Diagnosis not present

## 2018-02-18 DIAGNOSIS — J449 Chronic obstructive pulmonary disease, unspecified: Secondary | ICD-10-CM | POA: Diagnosis not present

## 2018-03-19 DIAGNOSIS — R0902 Hypoxemia: Secondary | ICD-10-CM | POA: Diagnosis not present

## 2018-03-19 DIAGNOSIS — J449 Chronic obstructive pulmonary disease, unspecified: Secondary | ICD-10-CM | POA: Diagnosis not present

## 2018-03-21 DIAGNOSIS — J449 Chronic obstructive pulmonary disease, unspecified: Secondary | ICD-10-CM | POA: Diagnosis not present

## 2018-03-30 ENCOUNTER — Other Ambulatory Visit: Payer: Self-pay | Admitting: Family Medicine

## 2018-03-30 DIAGNOSIS — I10 Essential (primary) hypertension: Secondary | ICD-10-CM

## 2018-03-30 NOTE — Telephone Encounter (Signed)
I have sent in a refill but September will be a month since she was seen so she needs an appointment for further refills

## 2018-03-30 NOTE — Telephone Encounter (Signed)
Pt requesting hctz refill, due 04/12/18. Last OV 04/2017. Routed to Dr. Charlett Blake to advise.

## 2018-04-14 NOTE — Telephone Encounter (Signed)
Author phoned pt. To make an appointment per Dr. Charlett Blake. Spoke with husband, and appointment made for 10/15 at 315PM.

## 2018-04-21 DIAGNOSIS — J449 Chronic obstructive pulmonary disease, unspecified: Secondary | ICD-10-CM | POA: Diagnosis not present

## 2018-05-19 ENCOUNTER — Encounter: Payer: Self-pay | Admitting: Family Medicine

## 2018-05-19 ENCOUNTER — Ambulatory Visit (INDEPENDENT_AMBULATORY_CARE_PROVIDER_SITE_OTHER): Payer: Medicare Other | Admitting: Family Medicine

## 2018-05-19 DIAGNOSIS — R739 Hyperglycemia, unspecified: Secondary | ICD-10-CM | POA: Diagnosis not present

## 2018-05-19 DIAGNOSIS — R197 Diarrhea, unspecified: Secondary | ICD-10-CM | POA: Diagnosis not present

## 2018-05-19 DIAGNOSIS — R030 Elevated blood-pressure reading, without diagnosis of hypertension: Secondary | ICD-10-CM

## 2018-05-19 DIAGNOSIS — E782 Mixed hyperlipidemia: Secondary | ICD-10-CM | POA: Diagnosis not present

## 2018-05-19 DIAGNOSIS — Z23 Encounter for immunization: Secondary | ICD-10-CM

## 2018-05-19 NOTE — Patient Instructions (Signed)
Shingrix is the new shingles shot, 2 shots over 2-6 months. At pharmacy Cholesterol Cholesterol is a white, waxy, fat-like substance that is needed by the human body in small amounts. The liver makes all the cholesterol we need. Cholesterol is carried from the liver by the blood through the blood vessels. Deposits of cholesterol (plaques) may build up on blood vessel (artery) walls. Plaques make the arteries narrower and stiffer. Cholesterol plaques increase the risk for heart attack and stroke. You cannot feel your cholesterol level even if it is very high. The only way to know that it is high is to have a blood test. Once you know your cholesterol levels, you should keep a record of the test results. Work with your health care provider to keep your levels in the desired range. What do the results mean?  Total cholesterol is a rough measure of all the cholesterol in your blood.  LDL (low-density lipoprotein) is the "bad" cholesterol. This is the type that causes plaque to build up on the artery walls. You want this level to be low.  HDL (high-density lipoprotein) is the "good" cholesterol because it cleans the arteries and carries the LDL away. You want this level to be high.  Triglycerides are fat that the body can either burn for energy or store. High levels are closely linked to heart disease. What are the desired levels of cholesterol?  Total cholesterol below 200.  LDL below 100 for people who are at risk, below 70 for people at very high risk.  HDL above 40 is good. A level of 60 or higher is considered to be protective against heart disease.  Triglycerides below 150. How can I lower my cholesterol? Diet Follow your diet program as told by your health care provider.  Choose fish or white meat chicken and [place], roasted or baked. Limit fatty cuts of red meat, fried foods, and processed meats, such as sausage and lunch meats.  Eat lots of fresh fruits and vegetables.  Choose whole  grains, beans, pasta, potatoes, and cereals.  Choose olive oil, corn oil, or canola oil, and use only small amounts.  Avoid butter, mayonnaise, shortening, or palm kernel oils.  Avoid foods with trans fats.  Drink skim or nonfat milk and eat low-fat or nonfat yogurt and cheeses. Avoid whole milk, cream, ice cream, egg yolks, and full-fat cheeses.  Healthier desserts include angel food cake, ginger snaps, animal crackers, hard candy, popsicles, and low-fat or nonfat frozen yogurt. Avoid pastries, cakes, pies, and cookies.  Exercise  Follow your exercise program as told by your health care provider. A regular program: ? Helps to decrease LDL and raise HDL. ? Helps with weight control.  Do things that increase your activity level, such as gardening, walking, and taking the stairs.  Ask your health care provider about ways that you can be more active in your daily life.  Medicine  Take over-the-counter and prescription medicines only as told by your health care provider. ? Medicine may be prescribed by your health care provider to help lower cholesterol and decrease the risk for heart disease. This is usually done if diet and exercise have failed to bring down cholesterol levels. ? If you have several risk factors, you may need medicine even if your levels are normal.  This information is not intended to replace advice given to you by your health care provider. Make sure you discuss any questions you have with your health care provider. Document Released: 04/16/2001 Document Revised: 02/17/2016  Document Reviewed: 01/20/2016 Elsevier Interactive Patient Education  [employer].

## 2018-05-19 NOTE — Assessment & Plan Note (Signed)
improving

## 2018-05-19 NOTE — Assessment & Plan Note (Signed)
Well controlled and tolerating

## 2018-05-20 NOTE — Assessment & Plan Note (Signed)
hgba1c acceptable, minimize simple carbs. Increase exercise as tolerated. Continue current meds 

## 2018-05-20 NOTE — Progress Notes (Signed)
Subjective:    Patient ID: Tanya Rush, female    DOB: May 06, 1946, 72 y.o.   MRN: [id number]  No chief complaint on file.   HPI Patient is in today for follow-up.  Overall she feels well.  She is here today to discuss medications and feels she is tolerating them well.  No recent febrile illness or acute concerns. Denies CP/palp/SOB/HA/congestion/fevers/GI or GU c/o. Taking meds as prescribed  Past Medical History:  Diagnosis Date  . Asthma   . COPD (chronic obstructive pulmonary disease) ([place])    severe; O2 dependent-- followed by pulm at [facility]  . Diarrhea 05/02/2017  . Elevated BP 09/28/2012  . Genital herpes   . History of ovarian cyst   . Medicare annual wellness visit, subsequent 05/04/2014  . Memory impairment 11/13/2014  . Osteoporosis 05/14/2015  . Overactive bladder 05/04/2014  . Scabies 01/16/2013  . Sun-damaged skin 04/04/2013  . Unsteady gait 05/14/2015    Past Surgical History:  Procedure Laterality Date  . APPENDECTOMY    . CESAREAN SECTION    . CYSTECTOMY    . OOPHORECTOMY Left   . TONSILECTOMY/ADENOIDECTOMY WITH MYRINGOTOMY      Family History  Problem Relation Age of Onset  . Heart disease Mother   . Stroke Mother   . Arthritis Father        rheumatoid  . Lymphoma Father   . COPD Father        smoker  . Alcohol abuse Brother        TBI  . Birth defects Brother   . Alcohol abuse Son   . Birth defects Son   . Alcohol abuse Son        recovering x 3 years  . Reye's syndrome Brother   . Birth defects Brother   . Hip fracture Maternal Aunt   . Cancer Neg Hx     Social History   Socioeconomic History  . Marital status: Married    Spouse name: Not on file  . Number of children: 3  . Years of education: Not on file  . Highest education level: Not on file  Occupational History  . Not on file  Social Needs  . Financial resource strain: Not on file  . Food insecurity:    Worry: Not on file    Inability: Not on file  . Transportation needs:      Medical: Not on file    Non-medical: Not on file  Tobacco Use  . Smoking status: Former Smoker    Years: 45.00    Last attempt to quit: 08/05/2006    Years since quitting: 11.7  . Smokeless tobacco: Never Used  Substance and Sexual Activity  . Alcohol use: No  . Drug use: No  . Sexual activity: Never    Comment: lives with husband, no dietary restrictions  Lifestyle  . Physical activity:    Days per week: Not on file    Minutes per session: Not on file  . Stress: Not on file  Relationships  . Social connections:    Talks on phone: Not on file    Gets together: Not on file    Attends religious service: Not on file    Active member of club or organization: Not on file    Attends meetings of clubs or organizations: Not on file    Relationship status: Not on file  . Intimate partner violence:    Fear of current or ex partner: Not on file  Emotionally abused: Not on file    Physically abused: Not on file    Forced sexual activity: Not on file  Other Topics Concern  . Not on file  Social History Narrative   3 children: 2 adopted, 1 biological          Outpatient Medications Prior to Visit  Medication Sig Dispense Refill  . albuterol (PROAIR HFA) 108 (90 BASE) MCG/ACT inhaler Inhale 2 puffs into the lungs daily as needed.      Marland Kitchen atorvastatin (LIPITOR) 20 MG tablet Take 1 tablet (20 mg total) by mouth daily at 6 PM. 90 tablet 1  . fluconazole (DIFLUCAN) 150 MG tablet Take 2 tablets po daily for 3 days Then take 1 tablet po once a week for 3 weeks 9 tablet 0  . Fluticasone-Salmeterol (ADVAIR DISKUS) 250-50 MCG/DOSE AEPB Inhale 1 puff into the lungs every 12 (twelve) hours.     . hydrochlorothiazide (HYDRODIURIL) 25 MG tablet TAKE 1 TABLET (25 MG TOTAL) BY MOUTH DAILY. 90 tablet 0  . lisinopril (PRINIVIL,ZESTRIL) 10 MG tablet TAKE 1 TABLET (10 MG TOTAL) BY MOUTH DAILY. 90 tablet 1  . metroNIDAZOLE (FLAGYL) 500 MG tablet Take 1 tablet (500 mg total) 3 (three) times daily by  mouth. 21 tablet 0  . Multiple Vitamin (MULTIVITAMIN) tablet Take 1 tablet by mouth daily.    Marland Kitchen oxybutynin (DITROPAN-XL) 10 MG 24 hr tablet TAKE 1 TABLET (10 MG TOTAL) BY MOUTH AT BEDTIME. 90 tablet 3  . valACYclovir (VALTREX) 1000 MG tablet TAKE 1 TABLET EVERY DAY 90 tablet 1   No facility-administered medications prior to visit.     Allergies  Allergen Reactions  . Chocolate     Other reaction(s): Unknown  . Aspirin     REACTION: sever chest pain    Review of Systems  Constitutional: Negative for fever and malaise/fatigue.  HENT: Negative for congestion.   Eyes: Negative for blurred vision.  Respiratory: Negative for shortness of breath.   Cardiovascular: Negative for chest pain, palpitations and leg swelling.  Gastrointestinal: Negative for abdominal pain, blood in stool and nausea.  Genitourinary: Negative for dysuria and frequency.  Musculoskeletal: Negative for falls.  Skin: Negative for rash.  Neurological: Negative for dizziness, loss of consciousness and headaches.  Endo/Heme/Allergies: Negative for environmental allergies.  Psychiatric/Behavioral: Negative for depression and memory loss. The patient is not nervous/anxious and does not have insomnia.        Objective:    Physical Exam  Constitutional: She is oriented to person, place, and time. She appears well-developed and well-nourished. No distress.  HENT:  Head: Normocephalic and atraumatic.  Eyes: Conjunctivae are normal.  Neck: Neck supple. No thyromegaly present.  Cardiovascular: Normal rate, regular rhythm and normal heart sounds.  No murmur heard. Pulmonary/Chest: Effort normal and breath sounds normal. No respiratory distress.  Abdominal: Soft. Bowel sounds are normal. She exhibits no distension and no mass. There is no tenderness.  Musculoskeletal: She exhibits no edema.  Lymphadenopathy:    She has no cervical adenopathy.  Neurological: She is alert and oriented to person, place, and time.  Skin:  Skin is warm and dry.  Psychiatric: She has a normal mood and affect. Her behavior is normal.    BP 130/82 (BP Location: Left Arm, Patient Position: Sitting, Cuff Size: Normal)   Pulse (!) 104   Temp 98.4 F (36.9 C) (Oral)   Resp 18   Wt 134 lb 9.6 oz (61.1 kg)   SpO2 92%   BMI 27.19  kg/m  Wt Readings from Last 3 Encounters:  05/19/18 134 lb 9.6 oz (61.1 kg)  06/05/17 119 lb 6 oz (54.1 kg)  05/02/17 124 lb (56.2 kg)     Lab Results  Component Value Date   WBC 13.4 (H) 06/05/2017   HGB 10.0 (L) 06/05/2017   HCT 31.0 (L) 06/05/2017   PLT 342.0 06/05/2017   GLUCOSE 109 (H) 06/05/2017   CHOL 167 05/10/2016   TRIG 99 05/10/2016   HDL 75 05/10/2016   LDLDIRECT 147.1 06/28/2008   LDLCALC 72 05/10/2016   ALT 8 05/02/2017   AST 13 05/02/2017   NA 136 06/05/2017   K 4.1 06/05/2017   CL 91 (L) 06/05/2017   CREATININE 0.64 06/05/2017   BUN 16 06/05/2017   CO2 41 (H) 06/05/2017   TSH 0.10 (L) 06/05/2017   HGBA1C 5.6 05/10/2016    Lab Results  Component Value Date   TSH 0.10 (L) 06/05/2017   Lab Results  Component Value Date   WBC 13.4 (H) 06/05/2017   HGB 10.0 (L) 06/05/2017   HCT 31.0 (L) 06/05/2017   MCV 102.7 (H) 06/05/2017   PLT 342.0 06/05/2017   Lab Results  Component Value Date   NA 136 06/05/2017   K 4.1 06/05/2017   CO2 41 (H) 06/05/2017   GLUCOSE 109 (H) 06/05/2017   BUN 16 06/05/2017   CREATININE 0.64 06/05/2017   BILITOT 0.2 05/02/2017   ALKPHOS 41 05/02/2017   AST 13 05/02/2017   ALT 8 05/02/2017   PROT 6.8 05/02/2017   ALBUMIN 4.1 05/02/2017   CALCIUM 9.6 06/05/2017   GFR 97.45 06/05/2017   Lab Results  Component Value Date   CHOL 167 05/10/2016   Lab Results  Component Value Date   HDL 75 05/10/2016   Lab Results  Component Value Date   LDLCALC 72 05/10/2016   Lab Results  Component Value Date   TRIG 99 05/10/2016   Lab Results  Component Value Date   CHOLHDL 2.2 05/10/2016   Lab Results  Component Value Date   HGBA1C  5.6 05/10/2016       Assessment & Plan:   Problem List Items Addressed This Visit    Hyperlipidemia, mixed    Pap today, no concerns on exam.       Elevated blood pressure, situational    Well controlled and tolerating      Hyperglycemia    hgba1c acceptable, minimize simple carbs. Increase exercise as tolerated. Continue current meds      Diarrhea    improving         I am having Tanya Rush maintain her Fluticasone-Salmeterol, albuterol, multivitamin, fluconazole, metroNIDAZOLE, oxybutynin, atorvastatin, valACYclovir, lisinopril, and hydrochlorothiazide.  No orders of the defined types were placed in this encounter.    Penni Homans, MD

## 2018-05-20 NOTE — Assessment & Plan Note (Signed)
Pap today, no concerns on exam.  

## 2018-05-23 ENCOUNTER — Other Ambulatory Visit: Payer: Self-pay | Admitting: Family Medicine

## 2018-06-22 ENCOUNTER — Other Ambulatory Visit: Payer: Self-pay | Admitting: Family Medicine

## 2018-06-22 DIAGNOSIS — R030 Elevated blood-pressure reading, without diagnosis of hypertension: Secondary | ICD-10-CM

## 2018-06-22 DIAGNOSIS — I10 Essential (primary) hypertension: Secondary | ICD-10-CM

## 2018-06-24 DIAGNOSIS — H2513 Age-related nuclear cataract, bilateral: Secondary | ICD-10-CM | POA: Diagnosis not present

## 2018-08-10 ENCOUNTER — Other Ambulatory Visit: Payer: Self-pay | Admitting: Family Medicine

## 2018-08-10 DIAGNOSIS — R350 Frequency of micturition: Secondary | ICD-10-CM

## 2018-08-20 DIAGNOSIS — J449 Chronic obstructive pulmonary disease, unspecified: Secondary | ICD-10-CM | POA: Diagnosis not present

## 2018-08-20 DIAGNOSIS — R0902 Hypoxemia: Secondary | ICD-10-CM | POA: Diagnosis not present

## 2018-09-15 ENCOUNTER — Other Ambulatory Visit: Payer: Self-pay | Admitting: Family Medicine

## 2018-10-20 DIAGNOSIS — J449 Chronic obstructive pulmonary disease, unspecified: Secondary | ICD-10-CM | POA: Diagnosis not present

## 2018-10-22 ENCOUNTER — Other Ambulatory Visit: Payer: Self-pay | Admitting: Family Medicine

## 2018-10-22 DIAGNOSIS — I10 Essential (primary) hypertension: Secondary | ICD-10-CM

## 2018-11-20 DIAGNOSIS — J449 Chronic obstructive pulmonary disease, unspecified: Secondary | ICD-10-CM | POA: Diagnosis not present

## 2018-12-20 DIAGNOSIS — J449 Chronic obstructive pulmonary disease, unspecified: Secondary | ICD-10-CM | POA: Diagnosis not present

## 2019-01-14 DIAGNOSIS — R0902 Hypoxemia: Secondary | ICD-10-CM | POA: Diagnosis not present

## 2019-01-14 DIAGNOSIS — J449 Chronic obstructive pulmonary disease, unspecified: Secondary | ICD-10-CM | POA: Diagnosis not present

## 2019-01-15 ENCOUNTER — Other Ambulatory Visit: Payer: Self-pay | Admitting: Family Medicine

## 2019-01-15 DIAGNOSIS — I10 Essential (primary) hypertension: Secondary | ICD-10-CM

## 2019-01-18 ENCOUNTER — Encounter: Payer: Self-pay | Admitting: Family Medicine

## 2019-01-20 DIAGNOSIS — J449 Chronic obstructive pulmonary disease, unspecified: Secondary | ICD-10-CM | POA: Diagnosis not present

## 2019-01-23 ENCOUNTER — Other Ambulatory Visit: Payer: Self-pay | Admitting: Family Medicine

## 2019-01-23 DIAGNOSIS — R030 Elevated blood-pressure reading, without diagnosis of hypertension: Secondary | ICD-10-CM

## 2019-02-18 ENCOUNTER — Other Ambulatory Visit: Payer: Self-pay | Admitting: Family Medicine

## 2019-02-18 DIAGNOSIS — R350 Frequency of micturition: Secondary | ICD-10-CM

## 2019-02-19 DIAGNOSIS — J449 Chronic obstructive pulmonary disease, unspecified: Secondary | ICD-10-CM | POA: Diagnosis not present

## 2019-02-21 ENCOUNTER — Other Ambulatory Visit: Payer: Self-pay | Admitting: Family Medicine

## 2019-04-18 ENCOUNTER — Other Ambulatory Visit: Payer: Self-pay | Admitting: Family Medicine

## 2019-05-07 ENCOUNTER — Other Ambulatory Visit: Payer: Self-pay | Admitting: Family Medicine

## 2019-05-07 DIAGNOSIS — I10 Essential (primary) hypertension: Secondary | ICD-10-CM

## 2019-06-01 DIAGNOSIS — R0902 Hypoxemia: Secondary | ICD-10-CM | POA: Diagnosis not present

## 2019-06-01 DIAGNOSIS — J441 Chronic obstructive pulmonary disease with (acute) exacerbation: Secondary | ICD-10-CM | POA: Diagnosis not present

## 2019-06-14 DIAGNOSIS — J449 Chronic obstructive pulmonary disease, unspecified: Secondary | ICD-10-CM | POA: Diagnosis not present

## 2019-06-14 DIAGNOSIS — R0902 Hypoxemia: Secondary | ICD-10-CM | POA: Diagnosis not present

## 2019-06-14 DIAGNOSIS — R0602 Shortness of breath: Secondary | ICD-10-CM | POA: Diagnosis not present

## 2019-06-20 ENCOUNTER — Emergency Department (HOSPITAL_COMMUNITY)
Admission: EM | Admit: 2019-06-20 | Discharge: 2019-06-20 | Disposition: A | Discharge status: Home / Self Care | Payer: Medicare Other | Attending: Emergency Medicine | Admitting: Emergency Medicine

## 2019-06-20 ENCOUNTER — Other Ambulatory Visit: Payer: Self-pay

## 2019-06-20 ENCOUNTER — Emergency Department (HOSPITAL_COMMUNITY): Payer: Medicare Other

## 2019-06-20 DIAGNOSIS — S99912A Unspecified injury of left ankle, initial encounter: Secondary | ICD-10-CM | POA: Diagnosis present

## 2019-06-20 DIAGNOSIS — Y999 Unspecified external cause status: Secondary | ICD-10-CM | POA: Insufficient documentation

## 2019-06-20 DIAGNOSIS — Z79899 Other long term (current) drug therapy: Secondary | ICD-10-CM | POA: Insufficient documentation

## 2019-06-20 DIAGNOSIS — J449 Chronic obstructive pulmonary disease, unspecified: Secondary | ICD-10-CM | POA: Insufficient documentation

## 2019-06-20 DIAGNOSIS — S8265XA Nondisplaced fracture of lateral malleolus of left fibula, initial encounter for closed fracture: Secondary | ICD-10-CM | POA: Diagnosis not present

## 2019-06-20 DIAGNOSIS — Z87891 Personal history of nicotine dependence: Secondary | ICD-10-CM | POA: Diagnosis not present

## 2019-06-20 DIAGNOSIS — S82832A Other fracture of upper and lower end of left fibula, initial encounter for closed fracture: Secondary | ICD-10-CM | POA: Diagnosis not present

## 2019-06-20 DIAGNOSIS — J45909 Unspecified asthma, uncomplicated: Secondary | ICD-10-CM | POA: Diagnosis not present

## 2019-06-20 DIAGNOSIS — R0602 Shortness of breath: Secondary | ICD-10-CM | POA: Diagnosis not present

## 2019-06-20 DIAGNOSIS — Y929 Unspecified place or not applicable: Secondary | ICD-10-CM | POA: Diagnosis not present

## 2019-06-20 DIAGNOSIS — S99922A Unspecified injury of left foot, initial encounter: Secondary | ICD-10-CM | POA: Diagnosis not present

## 2019-06-20 DIAGNOSIS — Y939 Activity, unspecified: Secondary | ICD-10-CM | POA: Insufficient documentation

## 2019-06-20 DIAGNOSIS — W1839XA Other fall on same level, initial encounter: Secondary | ICD-10-CM | POA: Insufficient documentation

## 2019-06-20 DIAGNOSIS — R000 Tachycardia, unspecified: Secondary | ICD-10-CM | POA: Diagnosis not present

## 2019-06-20 LAB — COMPREHENSIVE METABOLIC PANEL
ALT: 17 U/L (ref 0–44)
AST: 23 U/L (ref 15–41)
Albumin: 3.9 g/dL (ref 3.5–5.0)
Alkaline Phosphatase: 46 U/L (ref 38–126)
Anion gap: 14 (ref 5–15)
BUN: 30 mg/dL — ABNORMAL HIGH (ref 8–23)
CO2: 35 mmol/L — ABNORMAL HIGH (ref 22–32)
Calcium: 9.4 mg/dL (ref 8.9–10.3)
Chloride: 86 mmol/L — ABNORMAL LOW (ref 98–111)
Creatinine, Ser: 0.64 mg/dL (ref 0.44–1.00)
GFR calc Af Amer: >60 mL/min (ref >60)
GFR calc non Af Amer: >60 mL/min (ref >60)
Glucose, Bld: 197 mg/dL — ABNORMAL HIGH (ref 70–99)
Potassium: 4.3 mmol/L (ref 3.5–5.1)
Sodium: 135 mmol/L (ref 135–145)
Total Bilirubin: 0.5 mg/dL (ref 0.3–1.2)
Total Protein: 7 g/dL (ref 6.5–8.1)

## 2019-06-20 LAB — CBC WITH DIFFERENTIAL/PLATELET
Abs Immature Granulocytes: 0 10*3/uL (ref 0.00–0.07)
Basophils Absolute: 0 10*3/uL (ref 0.0–0.1)
Basophils Relative: 0 %
Eosinophils Absolute: 0.6 10*3/uL — ABNORMAL HIGH (ref 0.0–0.5)
Eosinophils Relative: 3 %
HCT: 33.5 % — ABNORMAL LOW (ref 36.0–46.0)
Hemoglobin: 10.1 g/dL — ABNORMAL LOW (ref 12.0–15.0)
Lymphocytes Relative: 12 %
Lymphs Abs: 2.2 10*3/uL (ref 0.7–4.0)
MCH: 33.6 pg (ref 26.0–34.0)
MCHC: 30.1 g/dL (ref 30.0–36.0)
MCV: 111.3 fL — ABNORMAL HIGH (ref 80.0–100.0)
Monocytes Absolute: 1.5 10*3/uL — ABNORMAL HIGH (ref 0.1–1.0)
Monocytes Relative: 8 %
Neutro Abs: 14.4 10*3/uL — ABNORMAL HIGH (ref 1.7–7.7)
Neutrophils Relative %: 77 %
Platelets: 266 10*3/uL (ref 150–400)
RBC: 3.01 MIL/uL — ABNORMAL LOW (ref 3.87–5.11)
RDW: 13 % (ref 11.5–15.5)
WBC: 18.7 10*3/uL — ABNORMAL HIGH (ref 4.0–10.5)
nRBC: 0 % (ref 0.0–0.2)
nRBC: 0 /100 WBC

## 2019-06-20 MED ORDER — HYDROCODONE-ACETAMINOPHEN 5-325 MG PO TABS: 1.0000 | ORAL_TABLET | Freq: Four times a day (QID) | ORAL | 0 refills | Status: DC | PRN

## 2019-06-20 MED ORDER — SODIUM CHLORIDE 0.9 % IV BOLUS: 1000.0000 mL | Freq: Once | INTRAVENOUS | Status: DC

## 2019-06-20 NOTE — ED Triage Notes (Signed)
The pt arrived by car  She fell and twisted her lg ankle  She has copd and is on 5 liters of 02 at homed.  sats here low initially from exertion

## 2019-06-20 NOTE — ED Notes (Signed)
Ortho tech here to apply cam walker

## 2019-06-20 NOTE — ED Provider Notes (Signed)
[facility]PART[facility] Provider Note   CSN: [id number] Arrival date & time: 06/20/19  0443     History   Chief Complaint Chief Complaint  Patient presents with  . Shortness of Breath  . Fall    HPI Tanya Rush is a 73 y.o. female with history of COPD oxygen dependent at 5 L who presents following fall with left foot and ankle pain.  Patient reports she leaned over and fell and twisted her ankle.  She did not hit her head or lose consciousness.  She is not anticoagulated.  She denies any neck or back pain.  She reports she is a little more short of breath today, however she gets that way if she gets excited or agitated.  She did not come to the hospital for her shortness of breath tonight.  She denies any fever, cough, chest pain, abdominal pain, nausea, vomiting, urinary symptoms.  She did not feel dizzy or lightheaded prior to her fall.     HPI  Past Medical History:  Diagnosis Date  . Asthma   . COPD (chronic obstructive pulmonary disease) ([place])    severe; O2 dependent-- followed by pulm at [facility]  . Diarrhea 05/02/2017  . Elevated BP 09/28/2012  . Genital herpes   . History of ovarian cyst   . Medicare annual wellness visit, subsequent 05/04/2014  . Memory impairment 11/13/2014  . Osteoporosis 05/14/2015  . Overactive bladder 05/04/2014  . Scabies 01/16/2013  . Sun-damaged skin 04/04/2013  . Unsteady gait 05/14/2015    Patient Active Problem List   Diagnosis Date Noted  . Diarrhea 05/02/2017  . ANA positive 03/31/2017  . Elevated sed rate 03/31/2017  . Rheumatoid factor positive 03/31/2017  . Chronic left shoulder pain 12/23/2016  . Chronic right shoulder pain 11/25/2016  . Fracture of superior rim of left pubis, subsequent encounter for fracture with delayed healing 11/25/2016  . Preventative health care 05/12/2016  . Osteoporosis 05/14/2015  . Unsteady gait 05/14/2015  . Memory impairment 11/13/2014  . Hyperglycemia 05/04/2014  .  Medicare annual wellness visit, subsequent 05/04/2014  . Insomnia 05/04/2014  . Overactive bladder 05/04/2014  . Sun-damaged skin 04/04/2013  . Elevated blood pressure, situational 09/28/2012  . Arthralgia 05/03/2012  . Hyperlipidemia, mixed 09/30/2011  . COPD (chronic obstructive pulmonary disease) ([place]) 07/06/2008  . GENITAL HERPES 06/28/2008  . LEG CRAMPS 06/28/2008    Past Surgical History:  Procedure Laterality Date  . APPENDECTOMY    . CESAREAN SECTION    . CYSTECTOMY    . OOPHORECTOMY Left   . TONSILECTOMY/ADENOIDECTOMY WITH MYRINGOTOMY       OB History   No obstetric history on file.      Home Medications    Prior to Admission medications   Medication Sig Start Date End Date Taking? Authorizing Provider  albuterol (PROAIR HFA) 108 (90 BASE) MCG/ACT inhaler Inhale 2 puffs into the lungs every 4 (four) hours as needed for wheezing or shortness of breath.    Yes [provider]  atorvastatin (LIPITOR) 20 MG tablet TAKE 1 TABLET (20 MG TOTAL) BY MOUTH DAILY AT 6 PM. 02/22/19  Yes Mosie Lukes, MD  budesonide (PULMICORT) 0.5 MG/2ML nebulizer solution Take 0.5 mg by nebulization 2 (two) times daily.  06/14/19  Yes [provider]  hydrochlorothiazide (HYDRODIURIL) 25 MG tablet TAKE 1 TABLET BY MOUTH EVERY DAY Patient taking differently: Take 25 mg by mouth daily.  05/07/19  Yes Mosie Lukes, MD  lisinopril (ZESTRIL)  10 MG tablet TAKE 1 TABLET (10 MG TOTAL) BY MOUTH DAILY. 01/25/19  Yes Mosie Lukes, MD  Multiple Vitamin (MULTIVITAMIN) tablet Take 1 tablet by mouth daily.   Yes [provider]  oxybutynin (DITROPAN-XL) 10 MG 24 hr tablet TAKE 1 TABLET BY MOUTH EVERYDAY AT BEDTIME Patient taking differently: Take 10 mg by mouth at bedtime.  02/22/19  Yes Mosie Lukes, MD  fluconazole (DIFLUCAN) 150 MG tablet Take 2 tablets po daily for 3 days Then take 1 tablet po once a week for 3 weeks Patient not taking: Reported on 06/20/2019 05/06/17    Mosie Lukes, MD  HYDROcodone-acetaminophen (NORCO/VICODIN) 5-325 MG tablet Take 1 tablet by mouth every 6 (six) hours as needed for severe pain. 06/20/19   Honore Wipperfurth, Bea Graff, PA-C  metroNIDAZOLE (FLAGYL) 500 MG tablet Take 1 tablet (500 mg total) 3 (three) times daily by mouth. Patient not taking: Reported on 06/20/2019 06/11/17   Willia Craze, NP  valACYclovir (VALTREX) 1000 MG tablet TAKE 1 TABLET BY MOUTH EVERY DAY Patient not taking: Reported on 06/20/2019 04/19/19   Mosie Lukes, MD    Family History Family History  Problem Relation Age of Onset  . Heart disease Mother   . Stroke Mother   . Arthritis Father        rheumatoid  . Lymphoma Father   . COPD Father        smoker  . Alcohol abuse Brother        TBI  . Birth defects Brother   . Alcohol abuse Son   . Birth defects Son   . Alcohol abuse Son        recovering x 3 years  . Reye's syndrome Brother   . Birth defects Brother   . Hip fracture Maternal Aunt   . Cancer Neg Hx     Social History Social History   Tobacco Use  . Smoking status: Former Smoker    Years: 45.00    Quit date: 08/05/2006    Years since quitting: 12.8  . Smokeless tobacco: Never Used  Substance Use Topics  . Alcohol use: No  . Drug use: No     Allergies   Chocolate and Aspirin   Review of Systems Review of Systems  Constitutional: Negative for chills and fever.  HENT: Negative for facial swelling and sore throat.   Respiratory: Positive for shortness of breath (baseline). Negative for cough.   Cardiovascular: Negative for chest pain.  Gastrointestinal: Negative for abdominal pain, nausea and vomiting.  Genitourinary: Negative for dysuria.  Musculoskeletal: Positive for arthralgias and joint swelling. Negative for back pain.  Skin: Negative for rash and wound.  Neurological: Negative for headaches.  Psychiatric/Behavioral: The patient is not nervous/anxious.      Physical Exam Updated Vital Signs BP 140/68   Pulse  97   Resp 18   SpO2 100%   Physical Exam Vitals signs and nursing note reviewed.  Constitutional:      General: She is not in acute distress.    Appearance: She is well-developed. She is not diaphoretic.  HENT:     Head: Normocephalic and atraumatic.     Mouth/Throat:     Pharynx: No oropharyngeal exudate.  Eyes:     General: No scleral icterus.       Right eye: No discharge.        Left eye: No discharge.     Conjunctiva/sclera: Conjunctivae normal.     Pupils: Pupils are equal,  round, and reactive to light.  Neck:     Musculoskeletal: Normal range of motion and neck supple.     Thyroid: No thyromegaly.  Cardiovascular:     Rate and Rhythm: Normal rate and regular rhythm.     Heart sounds: Normal heart sounds. No murmur. No friction rub. No gallop.   Pulmonary:     Effort: Pulmonary effort is normal. No respiratory distress.     Breath sounds: Normal breath sounds. No stridor. No wheezing or rales.  Abdominal:     General: Bowel sounds are normal. There is no distension.     Palpations: Abdomen is soft.     Tenderness: There is no abdominal tenderness. There is no guarding or rebound.  Musculoskeletal:     Comments: Tenderness to lateral aspect of left ankle and foot: Pulses intact, cap refill less than 2 seconds, sensation intact, limited range of motion due to pain, mild edema, no deformity noted; no proximal fibular tenderness No midline cervical, thoracic, or lumbar tenderness  Lymphadenopathy:     Cervical: No cervical adenopathy.  Skin:    General: Skin is warm and dry.     Coloration: Skin is not pale.     Findings: No rash.  Neurological:     Mental Status: She is alert.     Coordination: Coordination normal.      ED Treatments / Results  Labs (all labs ordered are listed, but only abnormal results are displayed) Labs Reviewed  CBC WITH DIFFERENTIAL/PLATELET - Abnormal; Notable for the following components:      Result Value   WBC 18.7 (*)    RBC 3.01  (*)    Hemoglobin 10.1 (*)    HCT 33.5 (*)    MCV 111.3 (*)    Neutro Abs 14.4 (*)    Monocytes Absolute 1.5 (*)    Eosinophils Absolute 0.6 (*)    All other components within normal limits  COMPREHENSIVE METABOLIC PANEL - Abnormal; Notable for the following components:   Chloride 86 (*)    CO2 35 (*)    Glucose, Bld 197 (*)    BUN 30 (*)    All other components within normal limits  URINALYSIS, ROUTINE W REFLEX MICROSCOPIC    EKG EKG Interpretation  Date/Time:  Sunday June 20 2019 04:56:36 EST Ventricular Rate:  102 PR Interval:    QRS Duration: 94 QT Interval:  348 QTC Calculation: [id number] R Axis:   33 Text Interpretation: Sinus tachycardia Otherwise within normal limits When compared with ECG of 04/08/2010, HEART RATE has increased Confirmed by Delora Fuel ([id number]) on 06/20/2019 5:15:25 AM   Radiology Dg Ankle Complete Left  Result Date: 06/20/2019 CLINICAL DATA:  Fall EXAM: LEFT ANKLE COMPLETE - 3+ VIEW COMPARISON:  None. FINDINGS: There is a nondisplaced fracture of the distal left fibula in oblique orientation. There is mild lateral malleolus soft tissue swelling. There is a small fracture fragment just anterior to the tibia of unclear donor site. IMPRESSION: 1. Nondisplaced fracture of the distal left fibula. 2. Small fracture fragment just anterior to the tibia of unclear donor site. This may be a small avulsion fracture. Electronically Signed   By: Ulyses Jarred M.D.   On: 06/20/2019 05:34   Dg Chest Portable 1 View  Result Date: 06/20/2019 CLINICAL DATA:  Shortness of breath EXAM: PORTABLE CHEST 1 VIEW COMPARISON:  07/03/2016 FINDINGS: Lungs are hyperinflated with diffuse interstitial coarsening. There is calcific aortic atherosclerosis. Mild blunting of both costophrenic angles. No focal airspace consolidation  or pulmonary edema. No pneumothorax or sizable pleural effusion. IMPRESSION: COPD without focal airspace disease or pulmonary edema. Electronically Signed   By:  Ulyses Jarred M.D.   On: 06/20/2019 05:31   Dg Foot Complete Left  Result Date: 06/20/2019 CLINICAL DATA:  Fall EXAM: LEFT FOOT - COMPLETE 3+ VIEW COMPARISON:  None. FINDINGS: There is no evidence of fracture or dislocation. There is no evidence of arthropathy or other focal bone abnormality. Soft tissues are unremarkable. IMPRESSION: Negative. Electronically Signed   By: Ulyses Jarred M.D.   On: 06/20/2019 05:31    Procedures Procedures (including critical care time)  Medications Ordered in ED Medications - No data to display   Initial Impression / Assessment and Plan / ED Course  I have reviewed the triage vital signs and the nursing notes.  Pertinent labs & imaging results that were available during my care of the patient were reviewed by me and considered in my medical decision making (see chart for details).        Patient presenting following fall with left ankle pain.  She is found to have nondisplaced left fibular fracture.  Will place in a cam walker.  Patient is neurovascularly intact.  Suspect mechanical fall.  Labs are unremarkable except for leukocytosis, which I suspect due to the trauma.  BUN is elevated, which I suspect may be related to CHF as patient does have some peripheral edema.  Patient's shortness of breath is stable for her.  Chest x-ray shows COPD.  Follow-up with orthopedics.  Will discharge home with short course of Vicodin for pain control.  Return precautions discussed.  Patient and husband understand and agree with plan.  Patient vital stable throughout ED course and discharged in satisfactory condition.  Patient also evaluated by my attending, Dr. Roxanne Mins, who guided the patient's management and agrees with plan.  Final Clinical Impressions(s) / ED Diagnoses   Final diagnoses:  Other closed fracture of distal end of left fibula, initial encounter    ED Discharge Orders         Ordered    HYDROcodone-acetaminophen (NORCO/VICODIN) 5-325 MG tablet  Every 6  hours PRN     06/20/19 0646           Frederica Kuster, PA-C [id number]    Delora Fuel, MD [phone]

## 2019-06-20 NOTE — Discharge Instructions (Addendum)
Wear cam walker at all times except when bathing.  Please follow-up with Dr. Ninfa Linden or another orthopedic doctor for further evaluation and treatment of your fracture.  Please return to the emergency department if you develop any new or worsening symptoms.  Do not drink alcohol, drive, operate machinery or participate in any other potentially dangerous activities while taking opiate pain medication as it may make you sleepy. Do not take this medication with any other sedating medications, either prescription or over-the-counter. If you were prescribed Percocet or Vicodin, do not take these with acetaminophen (Tylenol) as it is already contained within these medications and overdose of Tylenol is dangerous.   This medication is an opiate (or narcotic) pain medication and can be habit forming.  Use it as little as possible to achieve adequate pain control.  Do not use or use it with extreme caution if you have a history of opiate abuse or dependence. This medication is intended for your use only - do not give any to anyone else and keep it in a secure place where nobody else, especially children, have access to it. It will also cause or worsen constipation, so you may want to consider taking an over-the-counter stool softener while you are taking this medication.

## 2019-06-20 NOTE — ED Notes (Signed)
Pt still asking for her husband  She does not know  Her husbands cell number

## 2019-06-21 DIAGNOSIS — M25572 Pain in left ankle and joints of left foot: Secondary | ICD-10-CM | POA: Diagnosis not present

## 2019-06-28 ENCOUNTER — Encounter: Payer: Self-pay | Admitting: Family Medicine

## 2019-06-29 NOTE — Telephone Encounter (Signed)
Patient has virtual visit with pcp 11//25/20

## 2019-06-30 ENCOUNTER — Ambulatory Visit (INDEPENDENT_AMBULATORY_CARE_PROVIDER_SITE_OTHER): Payer: Medicare Other | Admitting: Family Medicine

## 2019-06-30 ENCOUNTER — Other Ambulatory Visit: Payer: Self-pay

## 2019-06-30 DIAGNOSIS — S82402A Unspecified fracture of shaft of left fibula, initial encounter for closed fracture: Secondary | ICD-10-CM

## 2019-06-30 DIAGNOSIS — R32 Unspecified urinary incontinence: Secondary | ICD-10-CM | POA: Diagnosis not present

## 2019-06-30 DIAGNOSIS — S82202A Unspecified fracture of shaft of left tibia, initial encounter for closed fracture: Secondary | ICD-10-CM

## 2019-06-30 DIAGNOSIS — R739 Hyperglycemia, unspecified: Secondary | ICD-10-CM

## 2019-06-30 DIAGNOSIS — K5900 Constipation, unspecified: Secondary | ICD-10-CM

## 2019-06-30 MED ORDER — HYDROCODONE-ACETAMINOPHEN 5-325 MG PO TABS: 0.5000 | ORAL_TABLET | Freq: Two times a day (BID) | ORAL | 0 refills | Status: AC | PRN

## 2019-06-30 MED ORDER — AMOXICILLIN 500 MG PO CAPS: 500.0000 mg | ORAL_CAPSULE | Freq: Three times a day (TID) | ORAL | 0 refills | Status: AC

## 2019-06-30 NOTE — Assessment & Plan Note (Signed)
She has recently suffered a left leg tib fib fracture and she is now casted and patient is non weight bearing. They did not have to proceed with surgery but she is struggling with even simple transfers, she needs help from family. She is in a good deal of pain so have refilled her Norco 5/325 1/2 to 1 tab twice daily prn. Encouraged to also take Tylenol ES 500 mg tid for the next week. Follow up with ortho. Will order a home health evaluation to review safety, medication and ADL needs.

## 2019-06-30 NOTE — Progress Notes (Signed)
Virtual Visit via Video Note  I connected with Tanya Rush on 06/30/19 at 10:00 AM EST by a video enabled telemedicine application and verified that I am speaking with the correct person using two identifiers.  Location: Patient: home Provider: home   I discussed the limitations of evaluation and management by telemedicine and the availability of in person appointments. The patient expressed understanding and agreed to proceed. CMA was able to get the patient set up on a video visit   Subjective:    Patient ID: Tanya Rush, female    DOB: 12-Aug-1945, 73 y.o.   MRN: [id number]  No chief complaint on file.   HPI Patient is in today for follow up on a tibia fibula fracture she has recently suffered and complications she has had as a result. She fell and broke her tibia and her fibula in the left leg. She did not require surgery and is being managed by an orthopaedist in the [facility] system Dr Norwood Levo. She is to remain non weighbaring for the foreseeable future and she has a follow up appointment with him on 07/12/2019. Her pain is notable both day and night and Hydrocodone is somewhat helpful and she denies any confusion or major side effects. She is not eating much but she is trying to drink liquids. Has not had a bowel movement all week but she denies any abdominal pain. Her greatest complaint is worsening urinary incontinence. She has a long history of mild incontinence but is now experiencing episodes every 20-30 minutes at times. Largely due to pain and immobility. Denies CP/palp/SOB/HA/congestion/fevers. Taking meds as prescribed  Past Medical History:  Diagnosis Date  . Asthma   . COPD (chronic obstructive pulmonary disease) ([place])    severe; O2 dependent-- [facility] pulm [facility]  . Diarrhea 05/02/2017  . Elevated BP 09/28/2012  . Genital herpes   . History of ovarian cyst   . Medicare annual wellness visit, subsequent 05/04/2014  . Memory impairment 11/13/2014  . Osteoporosis  05/14/2015  . Overactive bladder 05/04/2014  . Scabies 01/16/2013  . Sun-damaged skin 04/04/2013  . Unsteady gait 05/14/2015    Past Surgical History:  Procedure Laterality Date  . APPENDECTOMY    . CESAREAN SECTION    . CYSTECTOMY    . OOPHORECTOMY Left   . TONSILECTOMY/ADENOIDECTOMY WITH MYRINGOTOMY      Family History  Problem Relation Age of Onset  . Heart disease Mother   . Stroke Mother   . Arthritis Father        rheumatoid  . Lymphoma Father   . COPD Father        smoker  . Alcohol abuse Brother        TBI  . Birth defects Brother   . Alcohol abuse Son   . Birth defects Son   . Alcohol abuse Son        recovering x 3 years  . Reye's syndrome Brother   . Birth defects Brother   . Hip fracture Maternal Aunt   . Cancer Neg Hx     Social History   Socioeconomic History  . Marital status: Married    Spouse name: Not on file  . Number of children: 3  . Years of education: Not on file  . Highest education level: Not on file  Occupational History  . Not on file  Social Needs  . Financial resource strain: Not on file  . Food insecurity    Worry: Not on file  Inability: Not on file  . Transportation needs    Medical: Not on file    Non-medical: Not on file  Tobacco Use  . Smoking status: Former Smoker    Years: 45.00    Quit date: 08/05/2006    Years since quitting: 12.9  . Smokeless tobacco: Never Used  Substance and Sexual Activity  . Alcohol use: No  . Drug use: No  . Sexual activity: Never    Comment: lives with husband, no dietary restrictions  Lifestyle  . Physical activity    Days per week: Not on file    Minutes per session: Not on file  . Stress: Not on file  Relationships  . Social Herbalist on phone: Not on file    Gets together: Not on file    Attends religious service: Not on file    Active member of club or organization: Not on file    Attends meetings of clubs or organizations: Not on file    Relationship status: Not on  file  . Intimate partner violence    Fear of current or ex partner: Not on file    Emotionally abused: Not on file    Physically abused: Not on file    Forced sexual activity: Not on file  Other Topics Concern  . Not on file  Social History Narrative   3 children: 2 adopted, 1 biological          Outpatient Medications Prior to Visit  Medication Sig Dispense Refill  . albuterol (PROAIR HFA) 108 (90 BASE) MCG/ACT inhaler Inhale 2 puffs into the lungs every 4 (four) hours as needed for wheezing or shortness of breath.     Marland Kitchen atorvastatin (LIPITOR) 20 MG tablet TAKE 1 TABLET (20 MG TOTAL) BY MOUTH DAILY AT 6 PM. 90 tablet 1  . budesonide (PULMICORT) 0.5 MG/2ML nebulizer solution Take 0.5 mg by nebulization 2 (two) times daily.     . fluconazole (DIFLUCAN) 150 MG tablet Take 2 tablets po daily for 3 days Then take 1 tablet po once a week for 3 weeks (Patient not taking: Reported on 06/20/2019) 9 tablet 0  . hydrochlorothiazide (HYDRODIURIL) 25 MG tablet TAKE 1 TABLET BY MOUTH EVERY DAY (Patient taking differently: Take 25 mg by mouth daily. ) 90 tablet 0  . lisinopril (ZESTRIL) 10 MG tablet TAKE 1 TABLET (10 MG TOTAL) BY MOUTH DAILY. 90 tablet 1  . metroNIDAZOLE (FLAGYL) 500 MG tablet Take 1 tablet (500 mg total) 3 (three) times daily by mouth. (Patient not taking: Reported on 06/20/2019) 21 tablet 0  . Multiple Vitamin (MULTIVITAMIN) tablet Take 1 tablet by mouth daily.    Marland Kitchen oxybutynin (DITROPAN-XL) 10 MG 24 hr tablet TAKE 1 TABLET BY MOUTH EVERYDAY AT BEDTIME (Patient taking differently: Take 10 mg by mouth at bedtime. ) 90 tablet 1  . valACYclovir (VALTREX) 1000 MG tablet TAKE 1 TABLET BY MOUTH EVERY DAY (Patient not taking: Reported on 06/20/2019) 90 tablet 0  . HYDROcodone-acetaminophen (NORCO/VICODIN) 5-325 MG tablet Take 1 tablet by mouth every 6 (six) hours as needed for severe pain. 10 tablet 0   No facility-administered medications prior to visit.     Allergies  Allergen  Reactions  . Chocolate     Other reaction(s): Unknown  . Aspirin     REACTION: sever chest pain    Review of Systems  Constitutional: Positive for malaise/fatigue. Negative for fever.  HENT: Negative for congestion.   Eyes: Negative for blurred vision.  Respiratory: Negative for shortness of breath.   Cardiovascular: Negative for chest pain, palpitations and leg swelling.  Gastrointestinal: Positive for constipation. Negative for abdominal pain, blood in stool and nausea.  Genitourinary: Positive for frequency and urgency. Negative for dysuria.  Musculoskeletal: Positive for back pain, joint pain and myalgias. Negative for falls.  Skin: Negative for rash.  Neurological: Negative for dizziness, loss of consciousness and headaches.  Endo/Heme/Allergies: Negative for environmental allergies.  Psychiatric/Behavioral: Negative for depression. The patient is not nervous/anxious.        Objective:    Physical Exam Constitutional:      Appearance: Normal appearance. She is not ill-appearing.  HENT:     Head: Normocephalic and atraumatic.  Eyes:     General:        Right eye: No discharge.        Left eye: No discharge.  Pulmonary:     Effort: Pulmonary effort is normal.  Neurological:     Mental Status: She is alert and oriented to person, place, and time.  Psychiatric:        Mood and Affect: Mood normal.        Behavior: Behavior normal.     There were no vitals taken for this visit. Wt Readings from Last 3 Encounters:  05/19/18 134 lb 9.6 oz (61.1 kg)  06/05/17 119 lb 6 oz (54.1 kg)  05/02/17 124 lb (56.2 kg)    Diabetic Foot Exam - Simple   No data filed     Lab Results  Component Value Date   WBC 18.7 (H) 06/20/2019   HGB 10.1 (L) 06/20/2019   HCT 33.5 (L) 06/20/2019   PLT 266 06/20/2019   GLUCOSE 197 (H) 06/20/2019   CHOL 167 05/10/2016   TRIG 99 05/10/2016   HDL 75 05/10/2016   LDLDIRECT 147.1 06/28/2008   LDLCALC 72 05/10/2016   ALT 17 06/20/2019    AST 23 06/20/2019   NA 135 06/20/2019   K 4.3 06/20/2019   CL 86 (L) 06/20/2019   CREATININE 0.64 06/20/2019   BUN 30 (H) 06/20/2019   CO2 35 (H) 06/20/2019   TSH 0.10 (L) 06/05/2017   HGBA1C 5.6 05/10/2016    Lab Results  Component Value Date   TSH 0.10 (L) 06/05/2017   Lab Results  Component Value Date   WBC 18.7 (H) 06/20/2019   HGB 10.1 (L) 06/20/2019   HCT 33.5 (L) 06/20/2019   MCV 111.3 (H) 06/20/2019   PLT 266 06/20/2019   Lab Results  Component Value Date   NA 135 06/20/2019   K 4.3 06/20/2019   CO2 35 (H) 06/20/2019   GLUCOSE 197 (H) 06/20/2019   BUN 30 (H) 06/20/2019   CREATININE 0.64 06/20/2019   BILITOT 0.5 06/20/2019   ALKPHOS 46 06/20/2019   AST 23 06/20/2019   ALT 17 06/20/2019   PROT 7.0 06/20/2019   ALBUMIN 3.9 06/20/2019   CALCIUM 9.4 06/20/2019   ANIONGAP 14 06/20/2019   GFR 97.45 06/05/2017   Lab Results  Component Value Date   CHOL 167 05/10/2016   Lab Results  Component Value Date   HDL 75 05/10/2016   Lab Results  Component Value Date   LDLCALC 72 05/10/2016   Lab Results  Component Value Date   TRIG 99 05/10/2016   Lab Results  Component Value Date   CHOLHDL 2.2 05/10/2016   Lab Results  Component Value Date   HGBA1C 5.6 05/10/2016       Assessment & Plan:  Problem List Items Addressed This Visit    Constipation    Encouraged increased hydration and fiber in diet. Daily probiotics. If bowels not moving can use MOM 2 tbls po in 4 oz of warm prune juice by mouth every 2-3 days. If no results then repeat in 4 hours with  Dulcolax suppository pr, may repeat again in 4 more hours as needed. Seek care if symptoms worsen. Consider daily Miralax and/or Dulcolax if symptoms persist.       Hyperglycemia     minimize simple carbs. Increase exercise as tolerated.       Closed fracture of left fibula and tibia - Primary    She has recently suffered a left leg tib fib fracture and she is now casted and patient is non weight  bearing. They did not have to proceed with surgery but she is struggling with even simple transfers, she needs help from family. She is in a good deal of pain so have refilled her Norco 5/325 1/2 to 1 tab twice daily prn. Encouraged to also take Tylenol ES 500 mg tid for the next week. Follow up with ortho. Will order a home health evaluation to review safety, medication and ADL needs.       Relevant Orders   Ambulatory referral to [facility]   Urinary incontinence    Has a long history of mild trouble with incontinence but now with increased immobility and pain she is suffering from frequent episodes of urinary incontinence. She is started on Amoxicillin 500 mg tid x 5 days in case a UTI is worsening this and Home Health has been asked to go out and set up incontinence supplies.       Relevant Orders   Ambulatory referral to [facility]      I have changed Tanya Rush's HYDROcodone-acetaminophen. I am also having her start on amoxicillin. Additionally, I am having her maintain her albuterol, multivitamin, fluconazole, metroNIDAZOLE, lisinopril, oxybutynin, atorvastatin, valACYclovir, hydrochlorothiazide, and budesonide.  Meds ordered this encounter  Medications  . HYDROcodone-acetaminophen (NORCO/VICODIN) 5-325 MG tablet    Sig: Take 0.5-1 tablets by mouth 2 (two) times daily as needed for severe pain.    Dispense:  10 tablet    Refill:  0  . amoxicillin (AMOXIL) 500 MG capsule    Sig: Take 1 capsule (500 mg total) by mouth 3 (three) times daily.    Dispense:  15 capsule    Refill:  0      I discussed the assessment and treatment plan with the patient. The patient was provided an opportunity to ask questions and all were answered. The patient agreed with the plan and demonstrated an understanding of the instructions.   The patient was advised to call back or seek an in-person evaluation if the symptoms worsen or if the condition fails to improve as anticipated.  I provided 25  minutes of non-face-to-face time during this encounter.   Penni Homans, MD

## 2019-06-30 NOTE — Assessment & Plan Note (Signed)
Has a long history of mild trouble with incontinence but now with increased immobility and pain she is suffering from frequent episodes of urinary incontinence. She is started on Amoxicillin 500 mg tid x 5 days in case a UTI is worsening this and Home Health has been asked to go out and set up incontinence supplies.

## 2019-06-30 NOTE — Assessment & Plan Note (Signed)
Encouraged increased hydration and fiber in diet. Daily probiotics. If bowels not moving can use MOM 2 tbls po in 4 oz of warm prune juice by mouth every 2-3 days. If no results then repeat in 4 hours with  Dulcolax suppository pr, may repeat again in 4 more hours as needed. Seek care if symptoms worsen. Consider daily Miralax and/or Dulcolax if symptoms persist.  

## 2019-06-30 NOTE — Assessment & Plan Note (Signed)
minimize simple carbs. Increase exercise as tolerated.  

## 2019-07-09 ENCOUNTER — Telehealth: Payer: Self-pay

## 2019-07-09 NOTE — Telephone Encounter (Signed)
Copied from [place] [id number]. Topic: General - Other >> Jul 09, 2019  8:43 AM Rainey Pines A wrote: June from [facility] called to inform Dr. Charlett Blake  pt declined home health physical therapy. Per pts  spouse patient has been doing good and does not need home health services . Best contact [phone]

## 2019-07-12 DIAGNOSIS — M25572 Pain in left ankle and joints of left foot: Secondary | ICD-10-CM | POA: Diagnosis not present

## 2019-07-22 ENCOUNTER — Other Ambulatory Visit: Payer: Self-pay | Admitting: Family Medicine

## 2019-07-22 DIAGNOSIS — R030 Elevated blood-pressure reading, without diagnosis of hypertension: Secondary | ICD-10-CM

## 2019-08-05 ENCOUNTER — Other Ambulatory Visit: Payer: Self-pay

## 2019-08-05 ENCOUNTER — Ambulatory Visit (INDEPENDENT_AMBULATORY_CARE_PROVIDER_SITE_OTHER): Payer: Medicare Other | Admitting: Family Medicine

## 2019-08-05 DIAGNOSIS — N3281 Overactive bladder: Secondary | ICD-10-CM | POA: Diagnosis not present

## 2019-08-05 DIAGNOSIS — R739 Hyperglycemia, unspecified: Secondary | ICD-10-CM

## 2019-08-05 DIAGNOSIS — S82402A Unspecified fracture of shaft of left fibula, initial encounter for closed fracture: Secondary | ICD-10-CM

## 2019-08-05 DIAGNOSIS — J42 Unspecified chronic bronchitis: Secondary | ICD-10-CM

## 2019-08-05 DIAGNOSIS — S82202A Unspecified fracture of shaft of left tibia, initial encounter for closed fracture: Secondary | ICD-10-CM

## 2019-08-05 MED ORDER — OXYBUTYNIN CHLORIDE ER 15 MG PO TB24: 15.0000 mg | ORAL_TABLET | Freq: Every day | ORAL | 1 refills | Status: DC

## 2019-08-08 NOTE — Assessment & Plan Note (Signed)
hgba1c acceptable, minimize simple carbs. Increase exercise as tolerated.  

## 2019-08-08 NOTE — Assessment & Plan Note (Signed)
hsas struggled with a persistent cough. Encouraged to increase Albuterol use and add Mucinex, hydrate well and report if worsens.

## 2019-08-08 NOTE — Assessment & Plan Note (Signed)
Her pain is resolved and she is getting around the house much better, she has an appointment soon to see if she can come out of her cast. She has been able to transfer and move around the house some better.

## 2019-08-08 NOTE — Assessment & Plan Note (Signed)
Partial response to Oxybutynin will try increasing dosing and reevaluate.

## 2019-08-08 NOTE — Progress Notes (Addendum)
Virtual Visit via phone Note  I connected with Tanya Rush on 08/05/19 at 11:00 AM EST by a phone enabled telemedicine application and verified that I am speaking with the correct person using two identifiers.  Location: Patient: home Provider: home   I discussed the limitations of evaluation and management by telemedicine and the availability of in person appointments. The patient expressed understanding and agreed to proceed.  Magdalene Molly, CMA was able to get the patient set up on a visit, phone after being unable to set up video visit    Subjective:    Patient ID: Tanya Rush, female    DOB: 1946/02/21, 74 y.o.   MRN: [id number]  No chief complaint on file.   HPI Patient is in today for follow up on chronic medical concerns including overactive bladder, hyperlipidemia and follow up on her Tib fib fracture. Her leg pain has resolved and she has been able to transfer more and do simple things at home. No recent febrile illness or hospitalizations. Her urinary frequency has improved some with treatment of UTI but still going hourly. No dysuria or hematuria. Denies CP/palp/SOB/HA/congestion/fevers/GI c/o. Taking meds as prescribed  Past Medical History:  Diagnosis Date  . Asthma   . COPD (chronic obstructive pulmonary disease) ([place])    severe; O2 dependent-- [facility] pulm [facility]  . Diarrhea 05/02/2017  . Elevated BP 09/28/2012  . Genital herpes   . History of ovarian cyst   . Medicare annual wellness visit, subsequent 05/04/2014  . Memory impairment 11/13/2014  . Osteoporosis 05/14/2015  . Overactive bladder 05/04/2014  . Scabies 01/16/2013  . Sun-damaged skin 04/04/2013  . Unsteady gait 05/14/2015    Past Surgical History:  Procedure Laterality Date  . APPENDECTOMY    . CESAREAN SECTION    . CYSTECTOMY    . OOPHORECTOMY Left   . TONSILECTOMY/ADENOIDECTOMY WITH MYRINGOTOMY      Family History  Problem Relation Age of Onset  . Heart disease Mother   . Stroke  Mother   . Arthritis Father        rheumatoid  . Lymphoma Father   . COPD Father        smoker  . Alcohol abuse Brother        TBI  . Birth defects Brother   . Alcohol abuse Son   . Birth defects Son   . Alcohol abuse Son        recovering x 3 years  . Reye's syndrome Brother   . Birth defects Brother   . Hip fracture Maternal Aunt   . Cancer Neg Hx     Social History   Socioeconomic History  . Marital status: Married    Spouse name: Not on file  . Number of children: 3  . Years of education: Not on file  . Highest education level: Not on file  Occupational History  . Not on file  Tobacco Use  . Smoking status: Former Smoker    Years: 45.00    Quit date: 08/05/2006    Years since quitting: 13.0  . Smokeless tobacco: Never Used  Substance and Sexual Activity  . Alcohol use: No  . Drug use: No  . Sexual activity: Never    Comment: lives with husband, no dietary restrictions  Other Topics Concern  . Not on file  Social History Narrative   3 children: 2 adopted, 1 biological         Social Determinants of Health   Financial  Resource Strain:   . Difficulty of Paying Living Expenses: Not on file  Food Insecurity:   . Worried About Charity fundraiser in the Last Year: Not on file  . Ran Out of Food in the Last Year: Not on file  Transportation Needs:   . Lack of Transportation (Medical): Not on file  . Lack of Transportation (Non-Medical): Not on file  Physical Activity:   . Days of Exercise per Week: Not on file  . Minutes of Exercise per Session: Not on file  Stress:   . Feeling of Stress : Not on file  Social Connections:   . Frequency of Communication with Friends and Family: Not on file  . Frequency of Social Gatherings with Friends and Family: Not on file  . Attends Religious Services: Not on file  . Active Member of Clubs or Organizations: Not on file  . Attends Archivist Meetings: Not on file  . Marital Status: Not on file  Intimate  Partner Violence:   . Fear of Current or Ex-Partner: Not on file  . Emotionally Abused: Not on file  . Physically Abused: Not on file  . Sexually Abused: Not on file    Outpatient Medications Prior to Visit  Medication Sig Dispense Refill  . albuterol (PROAIR HFA) 108 (90 BASE) MCG/ACT inhaler Inhale 2 puffs into the lungs every 4 (four) hours as needed for wheezing or shortness of breath.     Marland Kitchen amoxicillin (AMOXIL) 500 MG capsule Take 1 capsule (500 mg total) by mouth 3 (three) times daily. 15 capsule 0  . atorvastatin (LIPITOR) 20 MG tablet TAKE 1 TABLET (20 MG TOTAL) BY MOUTH DAILY AT 6 PM. 90 tablet 1  . budesonide (PULMICORT) 0.5 MG/2ML nebulizer solution Take 0.5 mg by nebulization 2 (two) times daily.     . fluconazole (DIFLUCAN) 150 MG tablet Take 2 tablets po daily for 3 days Then take 1 tablet po once a week for 3 weeks (Patient not taking: Reported on 06/20/2019) 9 tablet 0  . hydrochlorothiazide (HYDRODIURIL) 25 MG tablet TAKE 1 TABLET BY MOUTH EVERY DAY (Patient taking differently: Take 25 mg by mouth daily. ) 90 tablet 0  . HYDROcodone-acetaminophen (NORCO/VICODIN) 5-325 MG tablet Take 0.5-1 tablets by mouth 2 (two) times daily as needed for severe pain. 10 tablet 0  . lisinopril (ZESTRIL) 10 MG tablet TAKE 1 TABLET (10 MG TOTAL) BY MOUTH DAILY. 90 tablet 1  . metroNIDAZOLE (FLAGYL) 500 MG tablet Take 1 tablet (500 mg total) 3 (three) times daily by mouth. (Patient not taking: Reported on 06/20/2019) 21 tablet 0  . Multiple Vitamin (MULTIVITAMIN) tablet Take 1 tablet by mouth daily.    . valACYclovir (VALTREX) 1000 MG tablet TAKE 1 TABLET BY MOUTH EVERY DAY (Patient not taking: Reported on 06/20/2019) 90 tablet 0  . oxybutynin (DITROPAN-XL) 10 MG 24 hr tablet TAKE 1 TABLET BY MOUTH EVERYDAY AT BEDTIME (Patient taking differently: Take 10 mg by mouth at bedtime. ) 90 tablet 1   No facility-administered medications prior to visit.    Allergies  Allergen Reactions  . Chocolate       Other reaction(s): Unknown  . Aspirin     REACTION: sever chest pain    Review of Systems  Constitutional: Negative for fever and malaise/fatigue.  HENT: Negative for congestion.   Eyes: Negative for blurred vision.  Respiratory: Positive for cough. Negative for shortness of breath.   Cardiovascular: Negative for chest pain, palpitations and leg swelling.  Gastrointestinal:  Negative for abdominal pain, blood in stool and nausea.  Genitourinary: Negative for dysuria and frequency.  Musculoskeletal: Negative for falls.  Skin: Negative for rash.  Neurological: Negative for dizziness, loss of consciousness and headaches.  Endo/Heme/Allergies: Negative for environmental allergies.  Psychiatric/Behavioral: Negative for depression. The patient is not nervous/anxious.        Objective:    Physical Exam Constitutional:      Appearance: Normal appearance. She is not ill-appearing.  HENT:     Head: Normocephalic and atraumatic.     Nose: Nose normal.  Pulmonary:     Effort: Pulmonary effort is normal.  Neurological:     Mental Status: She is alert and oriented to person, place, and time.  Psychiatric:        Mood and Affect: Mood normal.        Behavior: Behavior normal.     There were no vitals taken for this visit. Wt Readings from Last 3 Encounters:  05/19/18 134 lb 9.6 oz (61.1 kg)  06/05/17 119 lb 6 oz (54.1 kg)  05/02/17 124 lb (56.2 kg)    Diabetic Foot Exam - Simple   No data filed     Lab Results  Component Value Date   WBC 18.7 (H) 06/20/2019   HGB 10.1 (L) 06/20/2019   HCT 33.5 (L) 06/20/2019   PLT 266 06/20/2019   GLUCOSE 197 (H) 06/20/2019   CHOL 167 05/10/2016   TRIG 99 05/10/2016   HDL 75 05/10/2016   LDLDIRECT 147.1 06/28/2008   LDLCALC 72 05/10/2016   ALT 17 06/20/2019   AST 23 06/20/2019   NA 135 06/20/2019   K 4.3 06/20/2019   CL 86 (L) 06/20/2019   CREATININE 0.64 06/20/2019   BUN 30 (H) 06/20/2019   CO2 35 (H) 06/20/2019   TSH 0.10  (L) 06/05/2017   HGBA1C 5.6 05/10/2016    Lab Results  Component Value Date   TSH 0.10 (L) 06/05/2017   Lab Results  Component Value Date   WBC 18.7 (H) 06/20/2019   HGB 10.1 (L) 06/20/2019   HCT 33.5 (L) 06/20/2019   MCV 111.3 (H) 06/20/2019   PLT 266 06/20/2019   Lab Results  Component Value Date   NA 135 06/20/2019   K 4.3 06/20/2019   CO2 35 (H) 06/20/2019   GLUCOSE 197 (H) 06/20/2019   BUN 30 (H) 06/20/2019   CREATININE 0.64 06/20/2019   BILITOT 0.5 06/20/2019   ALKPHOS 46 06/20/2019   AST 23 06/20/2019   ALT 17 06/20/2019   PROT 7.0 06/20/2019   ALBUMIN 3.9 06/20/2019   CALCIUM 9.4 06/20/2019   ANIONGAP 14 06/20/2019   GFR 97.45 06/05/2017   Lab Results  Component Value Date   CHOL 167 05/10/2016   Lab Results  Component Value Date   HDL 75 05/10/2016   Lab Results  Component Value Date   LDLCALC 72 05/10/2016   Lab Results  Component Value Date   TRIG 99 05/10/2016   Lab Results  Component Value Date   CHOLHDL 2.2 05/10/2016   Lab Results  Component Value Date   HGBA1C 5.6 05/10/2016       Assessment & Plan:   Problem List Items Addressed This Visit    COPD (chronic obstructive pulmonary disease) ([place])    hsas struggled with a persistent cough. Encouraged to increase Albuterol use and add Mucinex, hydrate well and report if worsens.       Hyperglycemia    hgba1c acceptable, minimize simple carbs.  Increase exercise as tolerated.       Closed fracture of left fibula and tibia    Her pain is resolved and she is getting around the house much better, she has an appointment soon to see if she can come out of her cast. She has been able to transfer and move around the house some better.          I have discontinued Ivin Booty A. Worrall's oxybutynin. I am also having her start on oxybutynin. Additionally, I am having her maintain her albuterol, multivitamin, fluconazole, metroNIDAZOLE, atorvastatin, valACYclovir, hydrochlorothiazide, budesonide,  HYDROcodone-acetaminophen, amoxicillin, and lisinopril.  Meds ordered this encounter  Medications  . oxybutynin (DITROPAN XL) 15 MG 24 hr tablet    Sig: Take 1 tablet (15 mg total) by mouth at bedtime.    Dispense:  90 tablet    Refill:  1      I discussed the assessment and treatment plan with the patient. The patient was provided an opportunity to ask questions and all were answered. The patient agreed with the plan and demonstrated an understanding of the instructions.   The patient was advised to call back or seek an in-person evaluation if the symptoms worsen or if the condition fails to improve as anticipated.  I provided 15 minutes of non-face-to-face time during this encounter.   Penni Homans, MD

## 2019-08-09 ENCOUNTER — Other Ambulatory Visit: Payer: Self-pay | Admitting: Family Medicine

## 2019-08-09 DIAGNOSIS — M25572 Pain in left ankle and joints of left foot: Secondary | ICD-10-CM | POA: Diagnosis not present

## 2019-08-09 DIAGNOSIS — I10 Essential (primary) hypertension: Secondary | ICD-10-CM

## 2019-08-28 ENCOUNTER — Other Ambulatory Visit: Payer: Self-pay | Admitting: Family Medicine

## 2019-09-06 DIAGNOSIS — M25572 Pain in left ankle and joints of left foot: Secondary | ICD-10-CM | POA: Diagnosis not present

## 2019-09-21 ENCOUNTER — Telehealth: Payer: Self-pay | Admitting: Family Medicine

## 2019-09-21 NOTE — Telephone Encounter (Signed)
LM to schedule f/u in March (virtual or in office)

## 2019-09-27 DIAGNOSIS — R0902 Hypoxemia: Secondary | ICD-10-CM | POA: Diagnosis not present

## 2019-09-27 DIAGNOSIS — J449 Chronic obstructive pulmonary disease, unspecified: Secondary | ICD-10-CM | POA: Diagnosis not present

## 2019-10-12 ENCOUNTER — Other Ambulatory Visit: Payer: Self-pay | Admitting: Family Medicine

## 2019-10-12 DIAGNOSIS — R350 Frequency of micturition: Secondary | ICD-10-CM

## 2019-11-18 ENCOUNTER — Other Ambulatory Visit: Payer: Self-pay | Admitting: Family Medicine

## 2020-01-13 ENCOUNTER — Other Ambulatory Visit: Payer: Self-pay | Admitting: Family Medicine

## 2020-01-13 DIAGNOSIS — R030 Elevated blood-pressure reading, without diagnosis of hypertension: Secondary | ICD-10-CM

## 2020-02-03 ENCOUNTER — Other Ambulatory Visit: Payer: Self-pay | Admitting: Family Medicine

## 2020-02-12 ENCOUNTER — Other Ambulatory Visit: Payer: Self-pay | Admitting: Family Medicine

## 2020-02-15 ENCOUNTER — Other Ambulatory Visit: Payer: Self-pay | Admitting: Family Medicine

## 2020-02-15 DIAGNOSIS — I10 Essential (primary) hypertension: Secondary | ICD-10-CM

## 2020-02-19 ENCOUNTER — Other Ambulatory Visit: Payer: Self-pay | Admitting: Family Medicine

## 2020-03-02 ENCOUNTER — Other Ambulatory Visit: Payer: Self-pay | Admitting: Family Medicine

## 2020-03-27 ENCOUNTER — Other Ambulatory Visit: Payer: Self-pay | Admitting: Family Medicine

## 2020-04-04 ENCOUNTER — Other Ambulatory Visit: Payer: Self-pay | Admitting: Family Medicine

## 2020-04-04 DIAGNOSIS — R030 Elevated blood-pressure reading, without diagnosis of hypertension: Secondary | ICD-10-CM

## 2020-04-05 DIAGNOSIS — F17200 Nicotine dependence, unspecified, uncomplicated: Secondary | ICD-10-CM | POA: Diagnosis not present

## 2020-04-05 DIAGNOSIS — R0902 Hypoxemia: Secondary | ICD-10-CM | POA: Diagnosis not present

## 2020-04-05 DIAGNOSIS — J449 Chronic obstructive pulmonary disease, unspecified: Secondary | ICD-10-CM | POA: Diagnosis not present

## 2020-06-27 ENCOUNTER — Other Ambulatory Visit: Payer: Self-pay | Admitting: Family Medicine

## 2020-06-27 DIAGNOSIS — R030 Elevated blood-pressure reading, without diagnosis of hypertension: Secondary | ICD-10-CM

## 2020-07-14 ENCOUNTER — Other Ambulatory Visit: Payer: Self-pay | Admitting: Family Medicine

## 2020-07-14 DIAGNOSIS — R030 Elevated blood-pressure reading, without diagnosis of hypertension: Secondary | ICD-10-CM

## 2020-08-01 DIAGNOSIS — J449 Chronic obstructive pulmonary disease, unspecified: Secondary | ICD-10-CM | POA: Diagnosis not present

## 2020-08-01 DIAGNOSIS — R0902 Hypoxemia: Secondary | ICD-10-CM | POA: Diagnosis not present

## 2020-08-04 ENCOUNTER — Other Ambulatory Visit: Payer: Self-pay | Admitting: Family Medicine

## 2020-08-30 ENCOUNTER — Other Ambulatory Visit: Payer: Self-pay | Admitting: Family Medicine

## 2020-09-05 ENCOUNTER — Other Ambulatory Visit: Payer: Self-pay | Admitting: Family Medicine

## 2020-09-05 DIAGNOSIS — I10 Essential (primary) hypertension: Secondary | ICD-10-CM

## 2020-09-15 ENCOUNTER — Other Ambulatory Visit: Payer: Self-pay | Admitting: Family Medicine

## 2020-09-25 ENCOUNTER — Other Ambulatory Visit: Payer: Self-pay | Admitting: Family Medicine

## 2020-09-25 DIAGNOSIS — R030 Elevated blood-pressure reading, without diagnosis of hypertension: Secondary | ICD-10-CM

## 2020-09-25 NOTE — Telephone Encounter (Signed)
Left message with husband for patient to call back (she was sleep) to schedule follow up appointment.  Last seen in 2020.

## 2020-10-13 ENCOUNTER — Other Ambulatory Visit: Payer: Self-pay | Admitting: Family Medicine

## 2020-10-13 DIAGNOSIS — R030 Elevated blood-pressure reading, without diagnosis of hypertension: Secondary | ICD-10-CM

## 2020-10-23 DIAGNOSIS — Z87891 Personal history of nicotine dependence: Secondary | ICD-10-CM | POA: Diagnosis not present

## 2020-10-23 DIAGNOSIS — Z79899 Other long term (current) drug therapy: Secondary | ICD-10-CM | POA: Diagnosis not present

## 2020-10-23 DIAGNOSIS — Z886 Allergy status to analgesic agent status: Secondary | ICD-10-CM | POA: Diagnosis not present

## 2020-10-23 DIAGNOSIS — R0902 Hypoxemia: Secondary | ICD-10-CM | POA: Diagnosis not present

## 2020-10-23 DIAGNOSIS — R062 Wheezing: Secondary | ICD-10-CM | POA: Diagnosis not present

## 2020-10-23 DIAGNOSIS — R0602 Shortness of breath: Secondary | ICD-10-CM | POA: Diagnosis not present

## 2020-10-23 DIAGNOSIS — I10 Essential (primary) hypertension: Secondary | ICD-10-CM | POA: Diagnosis not present

## 2020-10-23 DIAGNOSIS — Z7951 Long term (current) use of inhaled steroids: Secondary | ICD-10-CM | POA: Diagnosis not present

## 2020-10-23 DIAGNOSIS — M810 Age-related osteoporosis without current pathological fracture: Secondary | ICD-10-CM | POA: Diagnosis not present

## 2020-10-23 DIAGNOSIS — E785 Hyperlipidemia, unspecified: Secondary | ICD-10-CM | POA: Diagnosis not present

## 2020-10-23 DIAGNOSIS — J441 Chronic obstructive pulmonary disease with (acute) exacerbation: Secondary | ICD-10-CM | POA: Diagnosis not present

## 2020-10-23 DIAGNOSIS — Z20822 Contact with and (suspected) exposure to covid-19: Secondary | ICD-10-CM | POA: Diagnosis not present

## 2020-10-23 DIAGNOSIS — J439 Emphysema, unspecified: Secondary | ICD-10-CM | POA: Diagnosis not present

## 2020-10-23 DIAGNOSIS — Z743 Need for continuous supervision: Secondary | ICD-10-CM | POA: Diagnosis not present

## 2020-10-23 DIAGNOSIS — R6889 Other general symptoms and signs: Secondary | ICD-10-CM | POA: Diagnosis not present

## 2020-10-29 ENCOUNTER — Other Ambulatory Visit: Payer: Self-pay | Admitting: Family Medicine

## 2020-11-01 DIAGNOSIS — J9621 Acute and chronic respiratory failure with hypoxia: Secondary | ICD-10-CM | POA: Diagnosis not present

## 2020-11-01 DIAGNOSIS — E785 Hyperlipidemia, unspecified: Secondary | ICD-10-CM | POA: Diagnosis not present

## 2020-11-01 DIAGNOSIS — Z515 Encounter for palliative care: Secondary | ICD-10-CM | POA: Diagnosis not present

## 2020-11-01 DIAGNOSIS — R109 Unspecified abdominal pain: Secondary | ICD-10-CM | POA: Diagnosis not present

## 2020-11-01 DIAGNOSIS — K5900 Constipation, unspecified: Secondary | ICD-10-CM | POA: Diagnosis not present

## 2020-11-01 DIAGNOSIS — R079 Chest pain, unspecified: Secondary | ICD-10-CM | POA: Diagnosis not present

## 2020-11-01 DIAGNOSIS — G929 Unspecified toxic encephalopathy: Secondary | ICD-10-CM | POA: Diagnosis not present

## 2020-11-01 DIAGNOSIS — I10 Essential (primary) hypertension: Secondary | ICD-10-CM | POA: Diagnosis not present

## 2020-11-01 DIAGNOSIS — R531 Weakness: Secondary | ICD-10-CM | POA: Diagnosis not present

## 2020-11-01 DIAGNOSIS — E43 Unspecified severe protein-calorie malnutrition: Secondary | ICD-10-CM | POA: Diagnosis not present

## 2020-11-01 DIAGNOSIS — J9622 Acute and chronic respiratory failure with hypercapnia: Secondary | ICD-10-CM | POA: Diagnosis not present

## 2020-11-01 DIAGNOSIS — R0602 Shortness of breath: Secondary | ICD-10-CM | POA: Diagnosis not present

## 2020-11-01 DIAGNOSIS — E871 Hypo-osmolality and hyponatremia: Secondary | ICD-10-CM | POA: Diagnosis not present

## 2020-11-01 DIAGNOSIS — E860 Dehydration: Secondary | ICD-10-CM | POA: Diagnosis not present

## 2020-11-01 DIAGNOSIS — R1319 Other dysphagia: Secondary | ICD-10-CM | POA: Diagnosis not present

## 2020-11-01 DIAGNOSIS — N3281 Overactive bladder: Secondary | ICD-10-CM | POA: Diagnosis not present

## 2020-11-01 DIAGNOSIS — Z87891 Personal history of nicotine dependence: Secondary | ICD-10-CM | POA: Diagnosis not present

## 2020-11-01 DIAGNOSIS — Z743 Need for continuous supervision: Secondary | ICD-10-CM | POA: Diagnosis not present

## 2020-11-01 DIAGNOSIS — Z7951 Long term (current) use of inhaled steroids: Secondary | ICD-10-CM | POA: Diagnosis not present

## 2020-11-01 DIAGNOSIS — W19XXXA Unspecified fall, initial encounter: Secondary | ICD-10-CM | POA: Diagnosis not present

## 2020-11-01 DIAGNOSIS — Z7189 Other specified counseling: Secondary | ICD-10-CM | POA: Diagnosis not present

## 2020-11-01 DIAGNOSIS — Z9981 Dependence on supplemental oxygen: Secondary | ICD-10-CM | POA: Diagnosis not present

## 2020-11-01 DIAGNOSIS — Z9181 History of falling: Secondary | ICD-10-CM | POA: Diagnosis not present

## 2020-11-01 DIAGNOSIS — D638 Anemia in other chronic diseases classified elsewhere: Secondary | ICD-10-CM | POA: Diagnosis not present

## 2020-11-01 DIAGNOSIS — R000 Tachycardia, unspecified: Secondary | ICD-10-CM | POA: Diagnosis not present

## 2020-11-01 DIAGNOSIS — R0789 Other chest pain: Secondary | ICD-10-CM | POA: Diagnosis not present

## 2020-11-01 DIAGNOSIS — Z79899 Other long term (current) drug therapy: Secondary | ICD-10-CM | POA: Diagnosis not present

## 2020-11-01 DIAGNOSIS — R0600 Dyspnea, unspecified: Secondary | ICD-10-CM | POA: Diagnosis not present

## 2020-11-01 DIAGNOSIS — Z888 Allergy status to other drugs, medicaments and biological substances status: Secondary | ICD-10-CM | POA: Diagnosis not present

## 2020-11-01 DIAGNOSIS — Z66 Do not resuscitate: Secondary | ICD-10-CM | POA: Diagnosis not present

## 2020-11-01 DIAGNOSIS — S2241XA Multiple fractures of ribs, right side, initial encounter for closed fracture: Secondary | ICD-10-CM | POA: Diagnosis not present

## 2020-11-01 DIAGNOSIS — J441 Chronic obstructive pulmonary disease with (acute) exacerbation: Secondary | ICD-10-CM | POA: Diagnosis not present

## 2020-11-01 DIAGNOSIS — J439 Emphysema, unspecified: Secondary | ICD-10-CM | POA: Diagnosis not present

## 2020-11-01 DIAGNOSIS — S299XXA Unspecified injury of thorax, initial encounter: Secondary | ICD-10-CM | POA: Diagnosis not present

## 2020-11-01 DIAGNOSIS — J9602 Acute respiratory failure with hypercapnia: Secondary | ICD-10-CM | POA: Diagnosis not present

## 2020-11-02 DIAGNOSIS — E43 Unspecified severe protein-calorie malnutrition: Secondary | ICD-10-CM | POA: Diagnosis not present

## 2020-11-02 DIAGNOSIS — J9622 Acute and chronic respiratory failure with hypercapnia: Secondary | ICD-10-CM | POA: Diagnosis not present

## 2020-11-02 DIAGNOSIS — J9621 Acute and chronic respiratory failure with hypoxia: Secondary | ICD-10-CM | POA: Diagnosis not present

## 2020-11-02 DIAGNOSIS — K5900 Constipation, unspecified: Secondary | ICD-10-CM | POA: Diagnosis not present

## 2020-11-02 DIAGNOSIS — J441 Chronic obstructive pulmonary disease with (acute) exacerbation: Secondary | ICD-10-CM | POA: Diagnosis not present

## 2020-11-02 DIAGNOSIS — R531 Weakness: Secondary | ICD-10-CM | POA: Diagnosis not present

## 2020-11-03 DIAGNOSIS — K5900 Constipation, unspecified: Secondary | ICD-10-CM | POA: Diagnosis not present

## 2020-11-03 DIAGNOSIS — J9621 Acute and chronic respiratory failure with hypoxia: Secondary | ICD-10-CM | POA: Diagnosis not present

## 2020-11-03 DIAGNOSIS — J441 Chronic obstructive pulmonary disease with (acute) exacerbation: Secondary | ICD-10-CM | POA: Diagnosis not present

## 2020-11-03 DIAGNOSIS — E43 Unspecified severe protein-calorie malnutrition: Secondary | ICD-10-CM | POA: Diagnosis not present

## 2020-11-03 DIAGNOSIS — R531 Weakness: Secondary | ICD-10-CM | POA: Diagnosis not present

## 2020-11-03 DIAGNOSIS — J9622 Acute and chronic respiratory failure with hypercapnia: Secondary | ICD-10-CM | POA: Diagnosis not present

## 2020-11-04 DIAGNOSIS — J441 Chronic obstructive pulmonary disease with (acute) exacerbation: Secondary | ICD-10-CM | POA: Diagnosis not present

## 2020-11-04 DIAGNOSIS — E43 Unspecified severe protein-calorie malnutrition: Secondary | ICD-10-CM | POA: Diagnosis not present

## 2020-11-04 DIAGNOSIS — R531 Weakness: Secondary | ICD-10-CM | POA: Diagnosis not present

## 2020-11-04 DIAGNOSIS — K5900 Constipation, unspecified: Secondary | ICD-10-CM | POA: Diagnosis not present

## 2020-11-10 ENCOUNTER — Telehealth: Payer: Self-pay | Admitting: Family Medicine

## 2020-11-10 ENCOUNTER — Other Ambulatory Visit: Payer: Self-pay | Admitting: Family Medicine

## 2020-11-10 DIAGNOSIS — I10 Essential (primary) hypertension: Secondary | ICD-10-CM

## 2020-11-13 NOTE — Telephone Encounter (Signed)
Condolence card sent.

## 2020-12-03 NOTE — Telephone Encounter (Signed)
Please start a condolence card

## 2020-12-03 NOTE — Telephone Encounter (Signed)
CallerAdonis Brook [facility]) Call back # [phone]  FYI  Patient expired 2020/11/24 at 100:00am

## 2020-12-03 DEATH — deceased

## 2020-12-21 ENCOUNTER — Ambulatory Visit: Payer: Medicare Other | Admitting: Family Medicine
